# Patient Record
Sex: Male | Born: 1950 | Race: White | Hispanic: No | Smoking: Former smoker
Health system: Southern US, Community
[De-identification: ages and names within clinical notes are randomized; demographics above are authoritative.]

## PROBLEM LIST (undated history)

## (undated) DIAGNOSIS — M1990 Unspecified osteoarthritis, unspecified site: Secondary | ICD-10-CM

## (undated) DIAGNOSIS — C801 Malignant (primary) neoplasm, unspecified: Secondary | ICD-10-CM

## (undated) DIAGNOSIS — I2510 Atherosclerotic heart disease of native coronary artery without angina pectoris: Secondary | ICD-10-CM

## (undated) DIAGNOSIS — E7800 Pure hypercholesterolemia, unspecified: Secondary | ICD-10-CM

## (undated) DIAGNOSIS — C61 Malignant neoplasm of prostate: Secondary | ICD-10-CM

## (undated) DIAGNOSIS — E039 Hypothyroidism, unspecified: Secondary | ICD-10-CM

## (undated) HISTORY — PX: HERNIA REPAIR: SHX51

## (undated) HISTORY — DX: Hypothyroidism, unspecified: E03.9

## (undated) HISTORY — PX: COLONOSCOPY: SHX174

## (undated) HISTORY — PX: MOHS SURGERY: SUR867

## (undated) HISTORY — DX: Malignant (primary) neoplasm, unspecified: C80.1

## (undated) HISTORY — DX: Malignant neoplasm of prostate: C61

## (undated) HISTORY — PX: TONSILLECTOMY: SUR1361

## (undated) HISTORY — PX: ESOPHAGOGASTRODUODENOSCOPY ENDOSCOPY: SHX5814

---

## 2000-05-23 ENCOUNTER — Encounter: Admission: RE | Admit: 2000-05-23 | Discharge: 2000-05-23 | Payer: Self-pay | Admitting: Family Medicine

## 2000-05-23 ENCOUNTER — Encounter: Payer: Self-pay | Admitting: Family Medicine

## 2009-06-17 ENCOUNTER — Ambulatory Visit: Admission: RE | Admit: 2009-06-17 | Discharge: 2009-07-27 | Payer: Self-pay | Admitting: Radiation Oncology

## 2012-03-07 ENCOUNTER — Other Ambulatory Visit: Payer: Self-pay | Admitting: Gastroenterology

## 2012-03-07 DIAGNOSIS — R109 Unspecified abdominal pain: Secondary | ICD-10-CM

## 2012-03-09 ENCOUNTER — Ambulatory Visit
Admission: RE | Admit: 2012-03-09 | Discharge: 2012-03-09 | Disposition: A | Discharge status: Home / Self Care | Payer: BC Managed Care – PPO | Source: Ambulatory Visit | Attending: Gastroenterology | Admitting: Gastroenterology

## 2012-03-09 DIAGNOSIS — R109 Unspecified abdominal pain: Secondary | ICD-10-CM

## 2014-09-16 ENCOUNTER — Telehealth: Payer: Self-pay | Admitting: *Deleted

## 2014-09-16 ENCOUNTER — Encounter: Payer: Self-pay | Admitting: *Deleted

## 2014-09-16 NOTE — Telephone Encounter (Signed)
ERROR

## 2014-09-16 NOTE — Telephone Encounter (Signed)
I CALLED PT TO GET FAMILY HX/STATUS INFORMATION. THE PT WAS IN A MEETING AND STATED HE WOULD CALL BACK LATER WITH THE INFORMATION.

## 2014-09-24 ENCOUNTER — Encounter: Payer: Self-pay | Admitting: Cardiovascular Disease

## 2014-09-24 ENCOUNTER — Ambulatory Visit (INDEPENDENT_AMBULATORY_CARE_PROVIDER_SITE_OTHER): Payer: BLUE CROSS/BLUE SHIELD | Admitting: Cardiovascular Disease

## 2014-09-24 VITALS — BP 106/80 | HR 74 | Ht 73.0 in | Wt 183.8 lb

## 2014-09-24 DIAGNOSIS — E785 Hyperlipidemia, unspecified: Secondary | ICD-10-CM

## 2014-09-24 NOTE — Patient Instructions (Addendum)
Medication Instructions:  Your physician recommends that you continue on your current medications as directed. Please refer to the Current Medication list given to you today.   Labwork: Your physician recommends that you return for lab work (Lipomed profile) in: 6 months at least 3 weeks before your appointment with Dr. Acie Fredrickson   Testing/Procedures: Your physician has requested that you have coronary calcium score.  Computed tomography (CT) is a painless test that uses an x-ray machine to take clear, detailed pictures of your blood vessels.    Follow-Up: Your physician wants you to follow-up in: 7 months with Dr. Acie Fredrickson.  You will receive a reminder letter in the mail two months in advance. If you don't receive a letter, please call our office to schedule the follow-up appointment.

## 2014-09-24 NOTE — Progress Notes (Signed)
Cardiology Office Note   Date:  09/24/2014   ID:  Charles Solis, DOB 1950-08-25, MRN [id number]  PCP:  Charles Sails, MD  Cardiologist:   Charles Headings, MD   Chief Complaint  Patient presents with  . Hyperlipidemia   Problems: 1. Hyperlipidemia 2. hypothyroidism   History of Present Illness: Charles Solis is a 64 y.o. male who presents for hyperlipidemia.  Charles Solis has seen Charles Solis over the past years.  He has had his TSH checked by Dr. Sharol Solis and it is normal .   Was tried on Atorvastatin and had some mental sluggishness fairly shortly after that.  His recent lab work shows total cholesterol 253, triglyceride levels 133, LDL cholesterols 179.  His LDL particle number is 2349   Is otherwise healthy. Does have a family hx of CAD Father had MI and CABG at age 74     Has never had any chest pain . Eats fairly well.    Works out regularly .     Past Medical History  Diagnosis Date  . Prostate cancer   . Hypothyroidism     Past Surgical History  Procedure Laterality Date  . Tonsillectomy       Current Outpatient Prescriptions  Medication Sig Dispense Refill  . NATURE-THROID 65 MG tablet Take 65 mg by mouth daily.  2   No current facility-administered medications for this visit.    Allergies:   Review of patient's allergies indicates no known allergies.    Social History:  The patient  reports that he has quit smoking. He does not have any smokeless tobacco history on file. He reports that he drinks alcohol. He reports that he does not use illicit drugs.   Family History:  The patient's family history includes Alzheimer's disease in his mother and paternal grandmother; Colon cancer in his father; Coronary artery disease in his maternal grandfather; Heart attack in his father and maternal grandfather; Heart disease in his paternal grandfather; Hyperlipidemia in his sister; Hypertension in his mother; Lung cancer in his paternal grandfather.     ROS:  Please see the history of present illness.    Review of Systems: Constitutional:  denies fever, chills, diaphoresis, appetite change and fatigue.  HEENT: denies photophobia, eye pain, redness, hearing loss, ear pain, congestion, sore throat, rhinorrhea, sneezing, neck pain, neck stiffness and tinnitus.  Respiratory: denies SOB, DOE, cough, chest tightness, and wheezing.  Cardiovascular: denies chest pain, palpitations and leg swelling.  Gastrointestinal: denies nausea, vomiting, abdominal pain, diarrhea, constipation, blood in stool.  Genitourinary: denies dysuria, urgency, frequency, hematuria, flank pain and difficulty urinating.  Musculoskeletal: denies  myalgias, back pain, joint swelling, arthralgias and gait problem.   Skin: denies pallor, rash and wound.  Neurological: denies dizziness, seizures, syncope, weakness, light-headedness, numbness and headaches.   Hematological: denies adenopathy, easy bruising, personal or family bleeding history.  Psychiatric/ Behavioral: denies suicidal ideation, mood changes, confusion, nervousness, sleep disturbance and agitation.       All other systems are reviewed and negative.    PHYSICAL EXAM: VS:  BP 106/80 mmHg  Pulse 74  Ht 6\' 1"  (1.854 m)  Wt 83.371 kg (183 lb 12.8 oz)  BMI 24.25 kg/m2 , BMI Body mass index is 24.25 kg/(m^2). GEN: Well nourished, well developed, in no acute distress HEENT: normal Neck: no JVD, carotid bruits, or masses Cardiac: RRR; no murmurs, rubs, or gallops,no edema  Respiratory:  clear to auscultation bilaterally, normal work of breathing GI: soft, nontender, nondistended, + BS MS:  no deformity or atrophy Skin: warm and dry, no rash Neuro:  Strength and sensation are intact Psych: normal   EKG:  EKG is ordered today. The ekg ordered today demonstrates NSR at 74.  Normal ECG   Recent Labs: No results found for requested labs within last 365 days.    Lipid Panel No results found for:  CHOL, TRIG, HDL, CHOLHDL, VLDL, LDLCALC, LDLDIRECT    Wt Readings from Last 3 Encounters:  09/24/14 83.371 kg (183 lb 12.8 oz)      Other studies Reviewed: Additional studies/ records that were reviewed today include: . Review of the above records demonstrates:    ASSESSMENT AND PLAN:  1.  Hyperlipidemia: The patient resides with a history of hyperlipidemia. He does not have any episodes of chest pain. He does not have a strong family history of coronary artery disease. His father did have a heart attack at age 29. He's been tried on statins before but developed some mental sluggishness after starting Lipitor.  We will schedule him for a coronary calcium score. If his cardiac calcium score is very low then I do not think that he necessarily needs to be on any specific treatment for his high cholesterol. He has any significant amount of coronary calcium and we need to strongly consider starting him on medication. Possible strategies include low-dose Crestor and/or Zetia. We'll see him back in 6 months for a lipomed  Profile.  I'll see him in the office several weeks after that blood work.   Current medicines are reviewed at length with the patient today.  The patient does not have concerns regarding medicines.  The following changes have been made:  no change  Labs/ tests ordered today include:  No orders of the defined types were placed in this encounter.     Disposition:   FU with me in 6 months     Charles Solis, Charles Cheng, MD  09/24/2014 4:50 PM    [facility] [place], [place], [place]  [address] Phone: [phone]; Fax: [phone]   [facility]  [address] [place], [place]  [address] [phone]   Fax [phone]

## 2014-09-29 ENCOUNTER — Ambulatory Visit (INDEPENDENT_AMBULATORY_CARE_PROVIDER_SITE_OTHER)
Admission: RE | Admit: 2014-09-29 | Discharge: 2014-09-29 | Disposition: A | Discharge status: Home / Self Care | Payer: BLUE CROSS/BLUE SHIELD | Source: Ambulatory Visit | Attending: Cardiovascular Disease | Admitting: Cardiovascular Disease

## 2014-09-29 DIAGNOSIS — E785 Hyperlipidemia, unspecified: Secondary | ICD-10-CM

## 2014-10-02 ENCOUNTER — Telehealth: Payer: Self-pay | Admitting: Nurse Practitioner

## 2014-10-02 MED ORDER — ROSUVASTATIN CALCIUM 10 MG PO TABS: 10.0000 mg | ORAL_TABLET | Freq: Every day | ORAL | Status: DC

## 2014-10-02 NOTE — Telephone Encounter (Signed)
-----   Message from Thayer Headings, MD sent at 09/29/2014  5:57 PM EDT ----- His LDL is 179.  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  Alternatively, if he has good insurance coverage, I think Crestor 10 mg a day may work better. See if he has good coverage for prescriptions  We can start Atorvastatin 40 and check lipids and lipomed profile, bmp, liver enz  in 3 months and then change to crestor if needed

## 2014-10-02 NOTE — Telephone Encounter (Signed)
Reviewed results with patient who verbalized understanding and agreement to start Crestor 10 mg once daily; wants Rx sent to [facility].  He states he would prefer to pay more for the better medication and reports "brain fog" when he was previously on Atorvastatin.  He is scheduled for follow-up with repeat labs including lipomed profile in 6 months.  I advised him to call back if he has questions or concerns.

## 2014-10-07 ENCOUNTER — Encounter: Payer: Self-pay | Admitting: Cardiovascular Disease

## 2014-10-21 ENCOUNTER — Ambulatory Visit: Payer: Self-pay | Admitting: Cardiovascular Disease

## 2014-12-24 ENCOUNTER — Encounter: Payer: Self-pay | Admitting: Cardiovascular Disease

## 2014-12-25 ENCOUNTER — Encounter: Payer: Self-pay | Admitting: Cardiovascular Disease

## 2015-03-23 ENCOUNTER — Other Ambulatory Visit (INDEPENDENT_AMBULATORY_CARE_PROVIDER_SITE_OTHER): Payer: BLUE CROSS/BLUE SHIELD | Admitting: *Deleted

## 2015-03-23 DIAGNOSIS — E785 Hyperlipidemia, unspecified: Secondary | ICD-10-CM | POA: Diagnosis not present

## 2015-03-23 NOTE — Addendum Note (Signed)
Addended by: Eulis Foster on: 03/23/2015 09:37 AM   Modules accepted: Orders

## 2015-03-26 LAB — CARDIO IQ(R) ADVANCED LIPID PANEL
APOLIPOPROTEIN (CARDIO IQ ADV LIPID PANEL): 91 mg/dL (ref 52–109)
Cholesterol, Total: 179 mg/dL (ref 125–200)
Cholesterol/HDL Ratio: 3.8 calc (ref <=5.0)
HDL Cholesterol: 47 mg/dL (ref >=40)
LDL LARGE: 7032 nmol/L (ref 4334–10815)
LDL MEDIUM: 243 nmol/L (ref 167–465)
LDL Particle Number: 1261 nmol/L (ref 1016–2185)
LDL Peak Size: 214 Angstrom — ABNORMAL LOW (ref >=218.2)
LDL SMALL: 251 nmol/L (ref 123–441)
LDL, Calculated: 86 mg/dL
LIPOPROTEIN (A) (CARDIO IQ ADV LIPID PANEL): 74 nmol/L (ref <75)
NON-HDL CHOLESTEROL (CARDIO IQ ADV LIPID PANEL): 132 mg/dL
Triglycerides: 230 mg/dL — ABNORMAL HIGH

## 2015-04-20 ENCOUNTER — Ambulatory Visit (INDEPENDENT_AMBULATORY_CARE_PROVIDER_SITE_OTHER): Payer: BLUE CROSS/BLUE SHIELD | Admitting: Cardiovascular Disease

## 2015-04-20 ENCOUNTER — Encounter: Payer: Self-pay | Admitting: Cardiovascular Disease

## 2015-04-20 VITALS — BP 100/80 | HR 70 | Ht 73.0 in | Wt 184.0 lb

## 2015-04-20 DIAGNOSIS — E785 Hyperlipidemia, unspecified: Secondary | ICD-10-CM

## 2015-04-20 MED ORDER — ROSUVASTATIN CALCIUM 10 MG PO TABS: ORAL_TABLET | ORAL | Status: DC

## 2015-04-20 NOTE — Progress Notes (Signed)
Cardiology Office Note   Date:  04/20/2015   ID:  Charles Solis, DOB [birth year], MRN [id number]  PCP:  Lollie Sails, MD  Cardiologist:   Thayer Headings, MD   Chief Complaint  Patient presents with  . Hyperlipidemia   Problems: 1. Hyperlipidemia 2. hypothyroidism   History of Present Illness: Charles Solis is a 65 y.o. male who presents for hyperlipidemia.  Charles Solis has seen Dr. Adriana Simas over the past years.  He has had his TSH checked by Dr. Sharol Roussel and it is normal .   Was tried on Atorvastatin and had some mental sluggishness fairly shortly after that.  His recent lab work shows total cholesterol 253, triglyceride levels 133, LDL cholesterols 179.  His LDL particle number is 2349   Is otherwise healthy. Does have a family hx of CAD Father had MI and CABG at age 17     Has never had any chest pain . Eats fairly well.    Works out regularly .    04/20/2015: Charles Solis is seen today for follow-up of his hypercholesterolemia. Coronary calcium score:  IMPRESSION: Coronary calcium score of 50. This was 72 percentile for age and sex matched control.  Was started on Crestor - tolerating fairly well,   Did not tolerate Atorvastatin in the past. Had some brain foggyness.  He's cut his dose to Crestor 10 mg 3 times a week approximate 4 months ago. His LDL particle number has decreased slightly. In August his LDL particle number was 1890 After the crestor 3 times a week, it came down to 1261.    Past Medical History  Diagnosis Date  . Prostate cancer ([place])   . Hypothyroidism     Past Surgical History  Procedure Laterality Date  . Tonsillectomy       Current Outpatient Prescriptions  Medication Sig Dispense Refill  . NATURE-THROID 65 MG tablet Take 65 mg by mouth daily.  2  . rosuvastatin (CRESTOR) 10 MG tablet Take 1 tablet (10 mg total) by mouth daily. 31 tablet 11   No current facility-administered medications for this visit.    Allergies:   Review of  patient's allergies indicates no known allergies.    Social History:  The patient  reports that he has quit smoking. He does not have any smokeless tobacco history on file. He reports that he drinks alcohol. He reports that he does not use illicit drugs.   Family History:  The patient's family history includes Alzheimer's disease in his mother and paternal grandmother; Colon cancer in his father; Coronary artery disease in his maternal grandfather; Heart attack in his father and maternal grandfather; Heart disease in his paternal grandfather; Hyperlipidemia in his sister; Hypertension in his mother; Lung cancer in his paternal grandfather.    ROS:  Please see the history of present illness.    Review of Systems: Constitutional:  denies fever, chills, diaphoresis, appetite change and fatigue.  HEENT: denies photophobia, eye pain, redness, hearing loss, ear pain, congestion, sore throat, rhinorrhea, sneezing, neck pain, neck stiffness and tinnitus.  Respiratory: denies SOB, DOE, cough, chest tightness, and wheezing.  Cardiovascular: denies chest pain, palpitations and leg swelling.  Gastrointestinal: denies nausea, vomiting, abdominal pain, diarrhea, constipation, blood in stool.  Genitourinary: denies dysuria, urgency, frequency, hematuria, flank pain and difficulty urinating.  Musculoskeletal: denies  myalgias, back pain, joint swelling, arthralgias and gait problem.   Skin: denies pallor, rash and wound.  Neurological: denies dizziness, seizures, syncope, weakness, light-headedness, numbness and headaches.   Hematological: denies  adenopathy, easy bruising, personal or family bleeding history.  Psychiatric/ Behavioral: denies suicidal ideation, mood changes, confusion, nervousness, sleep disturbance and agitation.       All other systems are reviewed and negative.    PHYSICAL EXAM: VS:  BP 100/80 mmHg  Pulse 70  Ht 6\' 1"  (1.854 m)  Wt 184 lb (83.462 kg)  BMI 24.28 kg/m2 , BMI Body  mass index is 24.28 kg/(m^2). GEN: Well nourished, well developed, in no acute distress HEENT: normal Neck: no JVD, carotid bruits, or masses Cardiac: RRR; no murmurs, rubs, or gallops,no edema  Respiratory:  clear to auscultation bilaterally, normal work of breathing GI: soft, nontender, nondistended, + BS MS: no deformity or atrophy Skin: warm and dry, no rash Neuro:  Strength and sensation are intact Psych: normal   EKG:  EKG is ordered today. The ekg ordered today demonstrates NSR at 74.  Normal ECG   Recent Labs: No results found for requested labs within last 365 days.    Lipid Panel    Component Value Date/Time   CHOL 179 03/23/2015 0937   TRIG 230* 03/23/2015 0937   HDL 47 03/23/2015 0937   CHOLHDL 3.8 03/23/2015 0937   LDLCALC 86 03/23/2015 0937      Wt Readings from Last 3 Encounters:  04/20/15 184 lb (83.462 kg)  09/24/14 183 lb 12.8 oz (83.371 kg)      Other studies Reviewed: Additional studies/ records that were reviewed today include: . Review of the above records demonstrates:    ASSESSMENT AND PLAN:  1.  Hyperlipidemia: The patient resides with a history of hyperlipidemia. He does not have any episodes of chest pain. He does not have a strong family history of coronary artery disease. His father did have a heart attack at age 60.  crestor is helping  Original LDL particle number was 1890 Crestor 10 mg 3 times a week , LDL particle number  = 1261  Will increase his crestor up to 10 mg 5 times a week. Will recheck Cardio IQ in 3 months with an office visit 3-4 weeks later.      Current medicines are reviewed at length with the patient today.  The patient does not have concerns regarding medicines.  The following changes have been made:  no change  Labs/ tests ordered today include:  No orders of the defined types were placed in this encounter.    Disposition:   FU with me in 3-4   months    Monick Rena, Wonda Cheng, MD  04/20/2015 11:38 AM      [place], [place], [place]  [address] Phone: [phone]; Fax: [phone]   [facility]  [address] [place], [place]  [address] [phone]   Fax [phone]

## 2015-04-20 NOTE — Patient Instructions (Signed)
Medication Instructions:  INCREASE Crestor 10 mg to 5 times per week   Labwork: Your physician recommends that you return for lab work in: 3 months for Cardio IQ/lipid panel   Testing/Procedures: None Ordered   Follow-Up: Your physician recommends that you schedule a follow-up appointment in: 4 months with Dr. Acie Fredrickson   If you need a refill on your cardiac medications before your next appointment, please call your pharmacy.   Thank you for choosing [facility]! Christen Bame, RN [phone]

## 2015-05-18 ENCOUNTER — Other Ambulatory Visit: Payer: Self-pay | Admitting: *Deleted

## 2015-05-18 MED ORDER — ROSUVASTATIN CALCIUM 10 MG PO TABS: ORAL_TABLET | ORAL | Status: DC

## 2015-07-21 ENCOUNTER — Other Ambulatory Visit (INDEPENDENT_AMBULATORY_CARE_PROVIDER_SITE_OTHER): Payer: BLUE CROSS/BLUE SHIELD

## 2015-07-21 DIAGNOSIS — E785 Hyperlipidemia, unspecified: Secondary | ICD-10-CM

## 2015-07-24 LAB — CARDIO IQ(R) ADVANCED LIPID PANEL
Apolipoprotein B: 88 mg/dL (ref 52–109)
CHOLESTEROL/HDL RATIO (CARDIO IQ ADV LIPID PANEL): 3 calc (ref <=5.0)
Cholesterol, Total: 170 mg/dL (ref 125–200)
HDL CHOLESTEROL (CARDIO IQ ADV LIPID PANEL): 56 mg/dL (ref >=40)
LDL CHOLESTEROL CALCULATED (CARDIO IQ ADV LIPID PANEL): 95 mg/dL
LDL Large: 5954 nmol/L (ref 4334–10815)
LDL Medium: 304 nmol/L (ref 167–465)
LDL PEAK SIZE: 221.1 Angstrom (ref >=218.2)
LDL Particle Number: 1402 nmol/L (ref 1016–2185)
LDL Small: 202 nmol/L (ref 123–441)
LIPOPROTEIN (A) (CARDIO IQ ADV LIPID PANEL): 62 nmol/L (ref <75)
NON-HDL CHOLESTEROL (CARDIO IQ ADV LIPID PANEL): 114 mg/dL
TRIGLYCERIDES (CARDIO IQ ADV LIPID PANEL): 97 mg/dL

## 2015-08-24 DIAGNOSIS — C61 Malignant neoplasm of prostate: Secondary | ICD-10-CM | POA: Diagnosis not present

## 2015-08-24 DIAGNOSIS — N5201 Erectile dysfunction due to arterial insufficiency: Secondary | ICD-10-CM | POA: Diagnosis not present

## 2015-08-24 DIAGNOSIS — K4090 Unilateral inguinal hernia, without obstruction or gangrene, not specified as recurrent: Secondary | ICD-10-CM | POA: Diagnosis not present

## 2015-09-29 DIAGNOSIS — L821 Other seborrheic keratosis: Secondary | ICD-10-CM | POA: Diagnosis not present

## 2015-09-29 DIAGNOSIS — D225 Melanocytic nevi of trunk: Secondary | ICD-10-CM | POA: Diagnosis not present

## 2015-09-29 DIAGNOSIS — L570 Actinic keratosis: Secondary | ICD-10-CM | POA: Diagnosis not present

## 2015-09-29 DIAGNOSIS — L812 Freckles: Secondary | ICD-10-CM | POA: Diagnosis not present

## 2015-09-29 DIAGNOSIS — L578 Other skin changes due to chronic exposure to nonionizing radiation: Secondary | ICD-10-CM | POA: Diagnosis not present

## 2015-09-29 DIAGNOSIS — D1801 Hemangioma of skin and subcutaneous tissue: Secondary | ICD-10-CM | POA: Diagnosis not present

## 2015-09-29 DIAGNOSIS — Z85828 Personal history of other malignant neoplasm of skin: Secondary | ICD-10-CM | POA: Diagnosis not present

## 2015-10-12 ENCOUNTER — Ambulatory Visit: Payer: BLUE CROSS/BLUE SHIELD | Admitting: Cardiovascular Disease

## 2015-11-05 ENCOUNTER — Encounter: Payer: Self-pay | Admitting: Cardiovascular Disease

## 2015-11-23 ENCOUNTER — Encounter: Payer: Self-pay | Admitting: Cardiovascular Disease

## 2015-11-23 ENCOUNTER — Encounter (INDEPENDENT_AMBULATORY_CARE_PROVIDER_SITE_OTHER): Payer: Self-pay

## 2015-11-23 ENCOUNTER — Ambulatory Visit (INDEPENDENT_AMBULATORY_CARE_PROVIDER_SITE_OTHER): Payer: Medicare Other | Admitting: Cardiovascular Disease

## 2015-11-23 VITALS — BP 130/90 | HR 68 | Ht 73.0 in | Wt 183.4 lb

## 2015-11-23 DIAGNOSIS — E785 Hyperlipidemia, unspecified: Secondary | ICD-10-CM

## 2015-11-23 MED ORDER — EZETIMIBE 10 MG PO TABS: 10.0000 mg | ORAL_TABLET | Freq: Every day | ORAL | 11 refills | Status: DC

## 2015-11-23 NOTE — Progress Notes (Signed)
Cardiology Office Note   Date:  11/23/2015   ID:  Tollie Gargus, DOB October 29, 1950, MRN [id number]  PCP:  Lollie Sails, MD  Cardiologist:   Mertie Moores, MD   Chief Complaint  Patient presents with  . Hyperlipidemia   Problems: 1. Hyperlipidemia 2. hypothyroidism    Charles Solis is a 65 y.o. male who presents for hyperlipidemia.  Charles Solis has seen Dr. Adriana Simas over the past years.  He has had his TSH checked by Dr. Sharol Roussel and it is normal .   Was tried on Atorvastatin and had some mental sluggishness fairly shortly after that.  His recent lab work shows total cholesterol 253, triglyceride levels 133, LDL cholesterols 179.  His LDL particle number is 2349   Is otherwise healthy. Does have a family hx of CAD Father had MI and CABG at age 48     Has never had any chest pain . Eats fairly well.    Works out regularly .    04/20/2015: Charles Solis is seen today for follow-up of his hypercholesterolemia. Coronary calcium score:  IMPRESSION: Coronary calcium score of 50. This was 57 percentile for age and sex matched control.  Was started on Crestor - tolerating fairly well,   Did not tolerate Atorvastatin in the past. Had some brain foggyness.  He's cut his dose to Crestor 10 mg 3 times a week approximate 4 months ago. His LDL particle number has decreased slightly. In August his LDL particle number was 1890 After the crestor 3 times a week, it came down to 1261.   Oct. 9 , 2017   Charles Solis is see today for follow Up of his hyperlipidemia. Cardio IQ on June 6 revealed an LDL particle number of 1042.   He has a LDL pattern A which is desirable .  Takes Crestor 10 mg M,W, F, Sat.  No CP or dyspnea.  Staying very active.  Watches his diet .   Past Medical History:  Diagnosis Date  . Hypothyroidism   . Prostate cancer [place])     Past Surgical History:  Procedure Laterality Date  . TONSILLECTOMY       Current Outpatient Prescriptions  Medication Sig Dispense Refill    . busPIRone (BUSPAR) 5 MG tablet Take 5 mg by mouth daily.  2  . NATURE-THROID 65 MG tablet Take 65 mg by mouth daily.  2  . PARoxetine (PAXIL) 30 MG tablet Take 30 mg by mouth at bedtime.  8  . rosuvastatin (CRESTOR) 10 MG tablet Take 5 days per week 90 tablet 2   No current facility-administered medications for this visit.     Allergies:   Review of patient's allergies indicates no known allergies.    Social History:  The patient  reports that he has quit smoking. He has never used smokeless tobacco. He reports that he drinks alcohol. He reports that he does not use drugs.   Family History:  The patient's family history includes Alzheimer's disease in his mother and paternal grandmother; Colon cancer in his father; Coronary artery disease in his maternal grandfather; Heart attack in his father and maternal grandfather; Heart disease in his paternal grandfather; Hyperlipidemia in his sister; Hypertension in his mother; Lung cancer in his paternal grandfather.    ROS:  Please see the history of present illness.    Review of Systems: Constitutional:  denies fever, chills, diaphoresis, appetite change and fatigue.  HEENT: denies photophobia, eye pain, redness, hearing loss, ear pain, congestion, sore throat, rhinorrhea, sneezing, neck pain,  neck stiffness and tinnitus.  Respiratory: denies SOB, DOE, cough, chest tightness, and wheezing.  Cardiovascular: denies chest pain, palpitations and leg swelling.  Gastrointestinal: denies nausea, vomiting, abdominal pain, diarrhea, constipation, blood in stool.  Genitourinary: denies dysuria, urgency, frequency, hematuria, flank pain and difficulty urinating.  Musculoskeletal: denies  myalgias, back pain, joint swelling, arthralgias and gait problem.   Skin: denies pallor, rash and wound.  Neurological: denies dizziness, seizures, syncope, weakness, light-headedness, numbness and headaches.   Hematological: denies adenopathy, easy bruising,  personal or family bleeding history.  Psychiatric/ Behavioral: denies suicidal ideation, mood changes, confusion, nervousness, sleep disturbance and agitation.       All other systems are reviewed and negative.    PHYSICAL EXAM: VS:  BP 130/90 (BP Location: Right Arm, Patient Position: Sitting, Cuff Size: Normal)   Pulse 68   Ht 6\' 1"  (1.854 m)   Wt 183 lb 6.4 oz (83.2 kg)   BMI 24.20 kg/m  , BMI Body mass index is 24.2 kg/m. GEN: Well nourished, well developed, in no acute distress  HEENT: normal  Neck: no JVD, carotid bruits, or masses Cardiac: RRR; no murmurs, rubs, or gallops,no edema  Respiratory:  clear to auscultation bilaterally, normal work of breathing GI: soft, nontender, nondistended, + BS MS: no deformity or atrophy  Skin: warm and dry, no rash Neuro:  Strength and sensation are intact Psych: normal   EKG:  EKG is ordered today. The ekg ordered today demonstrates NSR at 68.  Normal ECG   Recent Labs: No results found for requested labs within last 8760 hours.    Lipid Panel    Component Value Date/Time   CHOL 170 07/21/2015 0916   TRIG 97 07/21/2015 0916   HDL 56 07/21/2015 0916   CHOLHDL 3.0 07/21/2015 0916   LDLCALC 95 07/21/2015 0916      Wt Readings from Last 3 Encounters:  11/23/15 183 lb 6.4 oz (83.2 kg)  04/20/15 184 lb (83.5 kg)  09/24/14 183 lb 12.8 oz (83.4 kg)      Other studies Reviewed: Additional studies/ records that were reviewed today include: . Review of the above records demonstrates:    ASSESSMENT AND PLAN:  1.  Hyperlipidemia: The patient resides with a history of hyperlipidemia. He does not have any episodes of chest pain. He does not have a strong family history of coronary artery disease. His father did have a heart attack at age 68.  crestor is helping  Original LDL particle number was 1890 Crestor 10 mg  4 times a week , LDL particle number  =  1402   Will add Zetia to try to lower his LDL particle number to  1000.  Will recheck Cardio IQ in 3 months with an office visit in 6 months       Current medicines are reviewed at length with the patient today.  The patient does not have concerns regarding medicines.  The following changes have been made:  no change  Labs/ tests ordered today include:  No orders of the defined types were placed in this encounter.   Disposition:   FU with me in 6 months    Mertie Moores, MD  11/23/2015 9:55 AM    [place], [place], [place]  [address] Phone: [phone]; Fax: [phone]   [facility]  [address] [place], [place]  [address] [phone]   Fax [phone]

## 2015-11-23 NOTE — Patient Instructions (Signed)
Medication Instructions:  START Zetia 10 mg once daily   Labwork: Your physician recommends that you return for lab work in: 3 months  You will need to FAST for this appointment - nothing to eat or drink after midnight the night before except water.    Testing/Procedures: None Ordered   Follow-Up: Your physician wants you to follow-up in: 6 months with Dr. Acie Fredrickson.  You will receive a reminder letter in the mail two months in advance. If you don't receive a letter, please call our office to schedule the follow-up appointment.   If you need a refill on your cardiac medications before your next appointment, please call your pharmacy.   Thank you for choosing [facility]! Christen Bame, RN [phone]

## 2015-11-25 DIAGNOSIS — Z23 Encounter for immunization: Secondary | ICD-10-CM | POA: Diagnosis not present

## 2015-12-21 DIAGNOSIS — E039 Hypothyroidism, unspecified: Secondary | ICD-10-CM | POA: Diagnosis not present

## 2015-12-21 DIAGNOSIS — E291 Testicular hypofunction: Secondary | ICD-10-CM | POA: Diagnosis not present

## 2015-12-21 DIAGNOSIS — E559 Vitamin D deficiency, unspecified: Secondary | ICD-10-CM | POA: Diagnosis not present

## 2015-12-21 DIAGNOSIS — N400 Enlarged prostate without lower urinary tract symptoms: Secondary | ICD-10-CM | POA: Diagnosis not present

## 2015-12-21 DIAGNOSIS — C61 Malignant neoplasm of prostate: Secondary | ICD-10-CM | POA: Diagnosis not present

## 2015-12-21 DIAGNOSIS — N401 Enlarged prostate with lower urinary tract symptoms: Secondary | ICD-10-CM | POA: Diagnosis not present

## 2016-01-09 DIAGNOSIS — S61213A Laceration without foreign body of left middle finger without damage to nail, initial encounter: Secondary | ICD-10-CM | POA: Diagnosis not present

## 2016-01-13 DIAGNOSIS — J029 Acute pharyngitis, unspecified: Secondary | ICD-10-CM | POA: Diagnosis not present

## 2016-01-27 DIAGNOSIS — H903 Sensorineural hearing loss, bilateral: Secondary | ICD-10-CM | POA: Diagnosis not present

## 2016-01-27 DIAGNOSIS — H838X3 Other specified diseases of inner ear, bilateral: Secondary | ICD-10-CM | POA: Diagnosis not present

## 2016-02-23 ENCOUNTER — Other Ambulatory Visit: Payer: Medicare Other | Admitting: *Deleted

## 2016-02-23 ENCOUNTER — Encounter (INDEPENDENT_AMBULATORY_CARE_PROVIDER_SITE_OTHER): Payer: Self-pay

## 2016-02-23 DIAGNOSIS — E785 Hyperlipidemia, unspecified: Secondary | ICD-10-CM | POA: Diagnosis not present

## 2016-02-23 NOTE — Addendum Note (Signed)
Addended by: Eulis Foster on: 02/23/2016 10:45 AM   Modules accepted: Orders

## 2016-02-24 LAB — NMR, LIPOPROFILE
CHOLESTEROL: 168 mg/dL (ref 100–199)
HDL Cholesterol by NMR: 57 mg/dL (ref >39)
HDL PARTICLE NUMBER: 41.3 umol/L (ref >=30.5)
LDL PARTICLE NUMBER: 1229 nmol/L — AB (ref <1000)
LDL SIZE: 20.7 nm (ref >20.5)
LDL-C: 94 mg/dL (ref 0–99)
LP-IR Score: 52 — ABNORMAL HIGH (ref <=45)
SMALL LDL PARTICLE NUMBER: 507 nmol/L (ref <=527)
TRIGLYCERIDES BY NMR: 83 mg/dL (ref 0–149)

## 2016-02-24 LAB — COMPREHENSIVE METABOLIC PANEL
A/G RATIO: 2.7 — AB (ref 1.2–2.2)
ALT: 41 IU/L (ref 0–44)
AST: 34 IU/L (ref 0–40)
Albumin: 4.6 g/dL (ref 3.6–4.8)
Alkaline Phosphatase: 53 IU/L (ref 39–117)
BILIRUBIN TOTAL: 0.7 mg/dL (ref 0.0–1.2)
BUN/Creatinine Ratio: 28 — ABNORMAL HIGH (ref 10–24)
BUN: 25 mg/dL (ref 8–27)
CALCIUM: 9.7 mg/dL (ref 8.6–10.2)
CO2: 25 mmol/L (ref 18–29)
Chloride: 99 mmol/L (ref 96–106)
Creatinine, Ser: 0.88 mg/dL (ref 0.76–1.27)
GFR calc Af Amer: 104 mL/min/{1.73_m2} (ref >59)
GFR, EST NON AFRICAN AMERICAN: 90 mL/min/{1.73_m2} (ref >59)
GLOBULIN, TOTAL: 1.7 g/dL (ref 1.5–4.5)
Glucose: 95 mg/dL (ref 65–99)
POTASSIUM: 4.3 mmol/L (ref 3.5–5.2)
SODIUM: 141 mmol/L (ref 134–144)
Total Protein: 6.3 g/dL (ref 6.0–8.5)

## 2016-02-26 ENCOUNTER — Telehealth: Payer: Self-pay | Admitting: Nurse Practitioner

## 2016-02-26 MED ORDER — ROSUVASTATIN CALCIUM 20 MG PO TABS: 20.0000 mg | ORAL_TABLET | Freq: Every day | ORAL | 3 refills | Status: DC

## 2016-02-26 NOTE — Telephone Encounter (Signed)
-----   Message from Thayer Headings, MD sent at 02/24/2016  2:21 PM EST ----- LDL particle number is 1229 - goal is 1000 Will he tolerate a higher dose of crestor  Have him try Crestor 20 mg a day ,   Continue zetia 10 mg a day  Check labs in 3 months

## 2016-02-26 NOTE — Telephone Encounter (Signed)
Reviewed lab result and plan of care with patient.  He agrees to increase Crestor to 20 mg once daily.  He is currently taking 10 mg 4 times per week.  He will continue with zetia 10 mg 4 times per week.  He is due for follow-up in April with Dr. Acie Fredrickson and will come in fasting in preparation for repeat lab work. He thanked me for the call.

## 2016-03-29 DIAGNOSIS — K5730 Diverticulosis of large intestine without perforation or abscess without bleeding: Secondary | ICD-10-CM | POA: Diagnosis not present

## 2016-03-29 DIAGNOSIS — Z800 Family history of malignant neoplasm of digestive organs: Secondary | ICD-10-CM | POA: Diagnosis not present

## 2016-03-29 DIAGNOSIS — K644 Residual hemorrhoidal skin tags: Secondary | ICD-10-CM | POA: Diagnosis not present

## 2016-03-29 DIAGNOSIS — K648 Other hemorrhoids: Secondary | ICD-10-CM | POA: Diagnosis not present

## 2016-03-29 DIAGNOSIS — Z1211 Encounter for screening for malignant neoplasm of colon: Secondary | ICD-10-CM | POA: Diagnosis not present

## 2016-04-07 DIAGNOSIS — D485 Neoplasm of uncertain behavior of skin: Secondary | ICD-10-CM | POA: Diagnosis not present

## 2016-04-07 DIAGNOSIS — Z85828 Personal history of other malignant neoplasm of skin: Secondary | ICD-10-CM | POA: Diagnosis not present

## 2016-04-07 DIAGNOSIS — D225 Melanocytic nevi of trunk: Secondary | ICD-10-CM | POA: Diagnosis not present

## 2016-04-07 DIAGNOSIS — L812 Freckles: Secondary | ICD-10-CM | POA: Diagnosis not present

## 2016-04-07 DIAGNOSIS — C44319 Basal cell carcinoma of skin of other parts of face: Secondary | ICD-10-CM | POA: Diagnosis not present

## 2016-04-07 DIAGNOSIS — L570 Actinic keratosis: Secondary | ICD-10-CM | POA: Diagnosis not present

## 2016-04-07 DIAGNOSIS — L821 Other seborrheic keratosis: Secondary | ICD-10-CM | POA: Diagnosis not present

## 2016-04-13 DIAGNOSIS — C61 Malignant neoplasm of prostate: Secondary | ICD-10-CM | POA: Diagnosis not present

## 2016-04-20 DIAGNOSIS — N401 Enlarged prostate with lower urinary tract symptoms: Secondary | ICD-10-CM | POA: Diagnosis not present

## 2016-04-20 DIAGNOSIS — R3915 Urgency of urination: Secondary | ICD-10-CM | POA: Diagnosis not present

## 2016-04-20 DIAGNOSIS — C61 Malignant neoplasm of prostate: Secondary | ICD-10-CM | POA: Diagnosis not present

## 2016-04-20 DIAGNOSIS — N411 Chronic prostatitis: Secondary | ICD-10-CM | POA: Diagnosis not present

## 2016-04-25 DIAGNOSIS — Z85828 Personal history of other malignant neoplasm of skin: Secondary | ICD-10-CM | POA: Diagnosis not present

## 2016-04-25 DIAGNOSIS — C44319 Basal cell carcinoma of skin of other parts of face: Secondary | ICD-10-CM | POA: Diagnosis not present

## 2016-04-28 IMAGING — CT CT HEART SCORING
1 of 3 series · 10 of 20 positions shown, 13 images · non-contrast
Comparison: None

ADDENDUM:
OVER-READ INTERPRETATION  CT CHEST

The following report is an over-read performed by radiologist Dr.
does not include interpretation of cardiac or coronary anatomy or
pathology. The calcium score interpretation by the cardiologist is
attached.
CLINICAL DATA: Risk stratification
MEDICATIONS:
None.
EXAM:
Coronary Calcium Score
TECHNIQUE: The patient was scanned on a Siemens Sensation 16 slice scanner.
Axial non-contrast 3 mm slices were carried out through the heart.
The data set was analyzed on a dedicated work station and scored
using the Agatson method.

[Series 6: st thins for reformat · axial · 0.33mm/px · z∈[+1115,+1262]mm · 10 of 181 slices shown, 13 images]
[im 17/181  vessel]
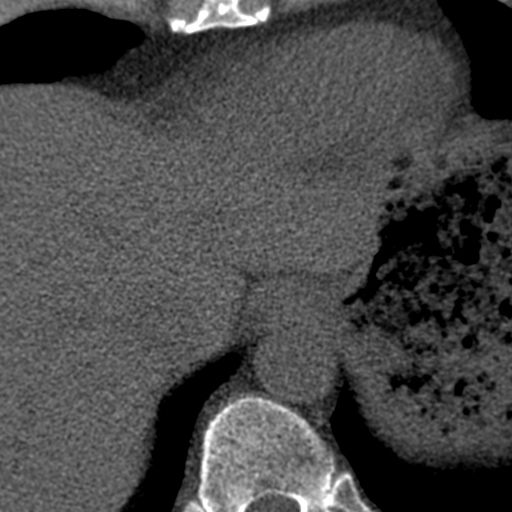
[im 17/181  lung]
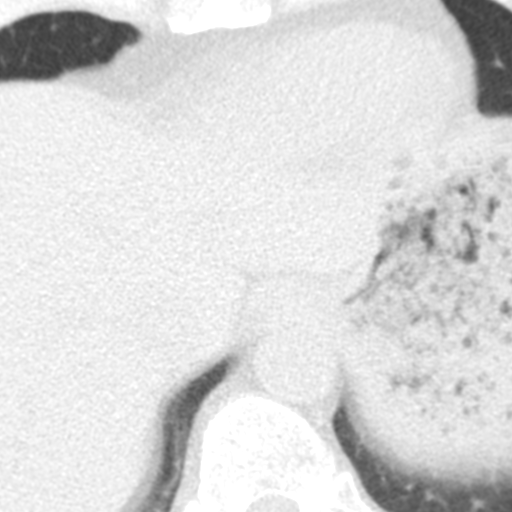
[im 33/181  vessel]
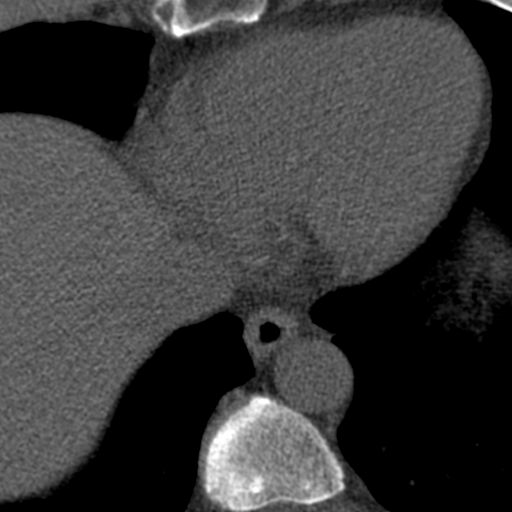
[im 50/181  vessel]
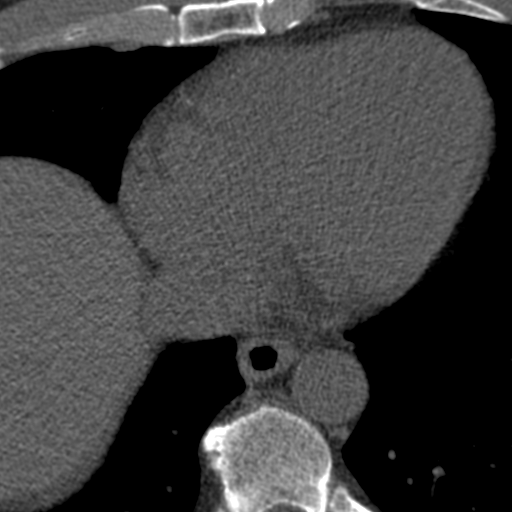
[im 66/181  vessel]
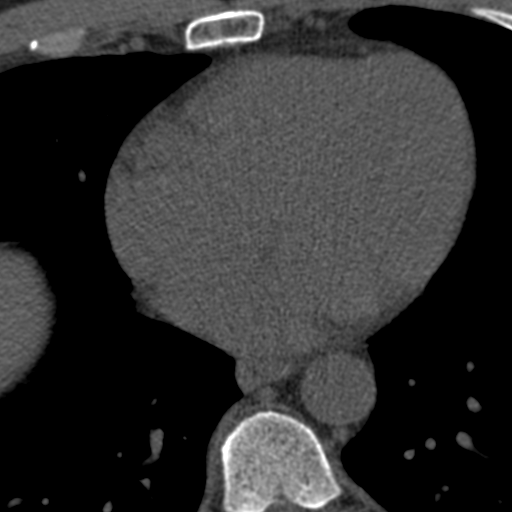
[im 82/181  vessel]
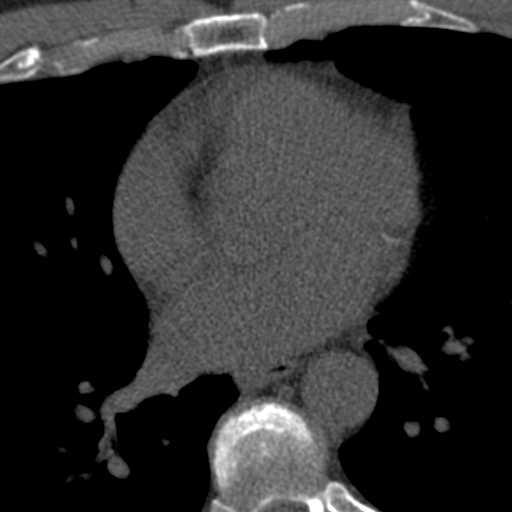
[im 82/181  lung]
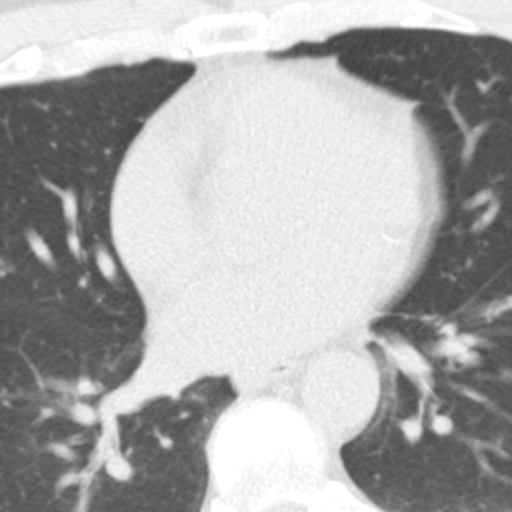
[im 99/181  vessel]
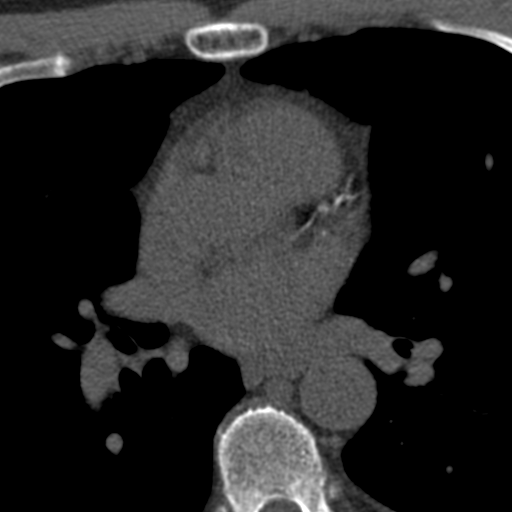
[im 115/181  vessel]
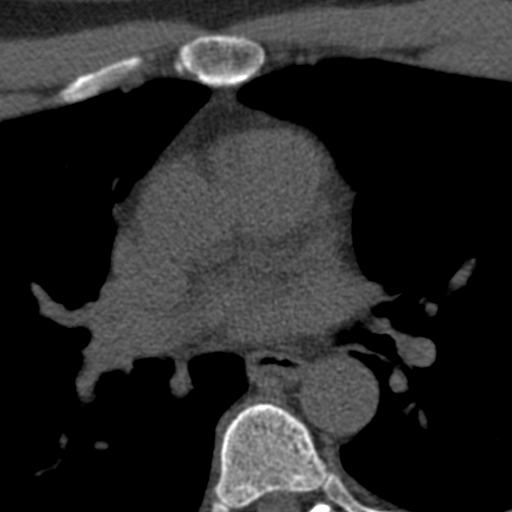
[im 131/181  vessel]
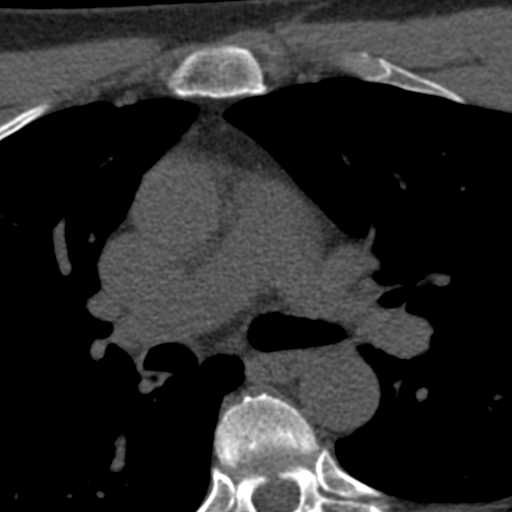
[im 148/181  vessel]
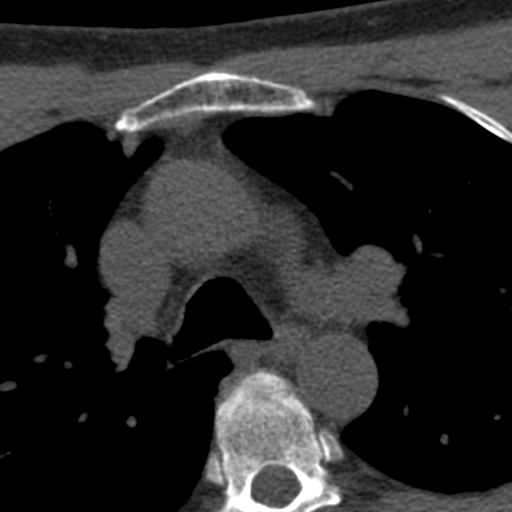
[im 148/181  lung]
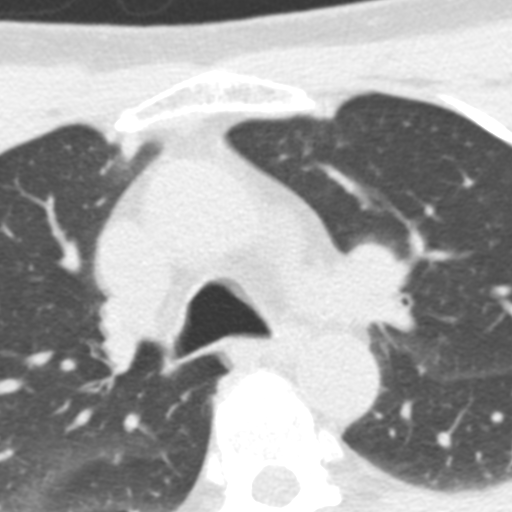
[im 164/181  vessel]
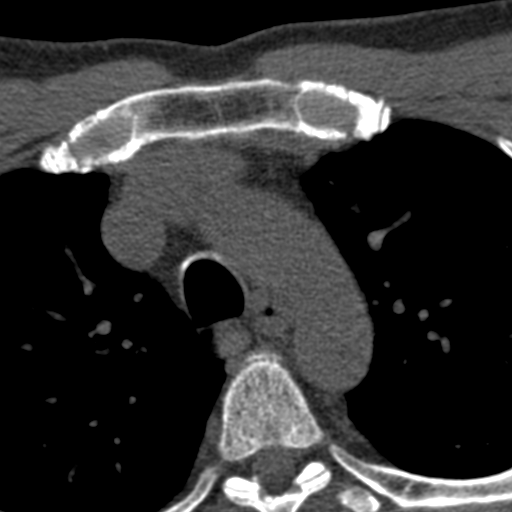

[10 of 20 positions shown; findings below may reference images not displayed]

FINDINGS: Mediastinum/Nodes: No mediastinal or definite hilar adenopathy,
given limitations of unenhanced CT.

Lungs/Pleura: No pleural fluid. Minimal motion degradation. Clear
lungs.

Upper abdomen: Normal imaged portions of the liver, spleen, stomach

Musculoskeletal: Thoracic spondylosis.
IMPRESSION: No significant extracardiac findings within the imaged chest.
FINDINGS: Non-cardiac: See separate report from [REDACTED].

Ascending Aorta:  Normal caliber.

Pericardium: Normal

Coronary arteries:  Normal origin.
IMPRESSION: Coronary calcium score of 50. This was 48 percentile for age and sex
matched control.

[address]

## 2016-05-02 DIAGNOSIS — C61 Malignant neoplasm of prostate: Secondary | ICD-10-CM | POA: Diagnosis not present

## 2016-05-02 DIAGNOSIS — Z0000 Encounter for general adult medical examination without abnormal findings: Secondary | ICD-10-CM | POA: Diagnosis not present

## 2016-05-02 DIAGNOSIS — Z8546 Personal history of malignant neoplasm of prostate: Secondary | ICD-10-CM | POA: Diagnosis not present

## 2016-05-02 DIAGNOSIS — C4490 Unspecified malignant neoplasm of skin, unspecified: Secondary | ICD-10-CM | POA: Diagnosis not present

## 2016-05-02 DIAGNOSIS — E038 Other specified hypothyroidism: Secondary | ICD-10-CM | POA: Diagnosis not present

## 2016-05-02 DIAGNOSIS — E7800 Pure hypercholesterolemia, unspecified: Secondary | ICD-10-CM | POA: Diagnosis not present

## 2016-05-23 ENCOUNTER — Ambulatory Visit (INDEPENDENT_AMBULATORY_CARE_PROVIDER_SITE_OTHER): Payer: Medicare Other | Admitting: Podiatry

## 2016-05-23 ENCOUNTER — Encounter: Payer: Self-pay | Admitting: Podiatry

## 2016-05-23 DIAGNOSIS — S90222A Contusion of left lesser toe(s) with damage to nail, initial encounter: Secondary | ICD-10-CM | POA: Diagnosis not present

## 2016-05-23 DIAGNOSIS — L601 Onycholysis: Secondary | ICD-10-CM

## 2016-05-23 NOTE — Patient Instructions (Signed)

## 2016-05-23 NOTE — Progress Notes (Signed)
   Subjective:    Patient ID: Charles Solis, male    DOB: Feb 12, 1951, 66 y.o.   MRN: [id number]  HPI  66 year old male presents the office they for concerns of his left big toenail becoming painful. He states that he was playing pickle ball about 2 weeks ago and stubbed his toe and since then he noticed blood underneath the toenail. More recently is started to hurt. He denies any redness or drainage coming from area. He said no recent treatment for a period is no other complaints today.   Review of Systems  All other systems reviewed and are negative.      Objective:   Physical Exam General: AAO x3, NAD  Dermatological: Left hallux toenail surgical lift from the underlying nail bed proximally. Underneath the entire toenail was several hematoma compressing 100% of the nail. There is localized edema to the proximal nail border but there is no surrounding erythema, ascending cellulitis. Is no drainage or pus or any other signs of infection. No open lesions.   Vascular: Dorsalis Pedis artery and Posterior Tibial artery pedal pulses are 2/4 bilateral with immedate capillary fill time. There is no pain with calf compression, swelling, warmth, erythema.   Neruologic: Grossly intact via light touch bilateral. Vibratory intact via tuning fork bilateral. Protective threshold with Semmes Wienstein monofilament intact to all pedal sites bilateral.   Musculoskeletal: No gross boney pedal deformities bilateral. No pain, crepitus, or limitation noted with foot and ankle range of motion bilateral. Muscular strength 5/5 in all groups tested bilateral.  Gait: Unassisted, Nonantalgic.      Assessment & Plan:  35-year-old male left subungual hematoma, onycholysis -Treatment options discussed including all alternatives, risks, and complications -Etiology of symptoms were discussed -At this time, recommended total nail removal without chemical matricectomy to left hallux nail. Risks and complications were  discussed with the patient for which they understand and  verbally consent to the procedure. Under sterile conditions a total of 3 mL of a mixture of 2% lidocaine plain and 0.5% Marcaine plain was infiltrated in a hallux block fashion. Once anesthetized, the skin was prepped in sterile fashion. A tourniquet was then applied. Next the left hallux  nail was excised making sure to remove the entire offending nail border. Once the nail was removed, the area was debrided and the underlying skin was intact. No laceration present.The area was irrigated and hemostasis was obtained.  A dry sterile dressing was applied. After application of the dressing the tourniquet was removed and there is found to be an immediate capillary refill time to the digit. The patient tolerated the procedure well any complications. Post procedure instructions were discussed the patient for which he verbally understood. Follow-up in one week for nail check or sooner if any problems are to arise. Discussed signs/symptoms of worsening infection and directed to call the office immediately should any occur or go directly to the emergency room. In the meantime, encouraged to call the office with any questions, concerns, changes symptoms.  Celesta Gentile, DPM

## 2016-06-02 ENCOUNTER — Ambulatory Visit (INDEPENDENT_AMBULATORY_CARE_PROVIDER_SITE_OTHER): Payer: Medicare Other | Admitting: Podiatry

## 2016-06-02 ENCOUNTER — Encounter: Payer: Self-pay | Admitting: Podiatry

## 2016-06-02 DIAGNOSIS — Z9889 Other specified postprocedural states: Secondary | ICD-10-CM

## 2016-06-02 NOTE — Patient Instructions (Signed)

## 2016-06-02 NOTE — Progress Notes (Signed)
Subjective: Charles Solis is a 66 y.o.  male returns to office today for follow up evaluation after having left Hallux total  nail avulsion performed. Patient has been soaking using epsom salts and applying topical antibiotic covered with bandaid daily until yesterday because he thought it was healing well. Denies any pain., drainage, swelling.  Patient denies fevers, chills, nausea, vomiting. Denies any calf pain, chest pain, SOB.   Objective:  Vitals: Reviewed  General: Well developed, nourished, in no acute distress, alert and oriented x3   Dermatology: Skin is warm, dry and supple bilateral. Left hallux nail bed appears to be clean, dry, with mild granular tissue and surrounding scab. There is no surrounding erythema, edema, drainage/purulence. The remaining nails appear unremarkable at this time. There are no other lesions or other signs of infection present.  Neurovascular status: Intact. No lower extremity swelling; No pain with calf compression bilateral.  Musculoskeletal: Decreased tenderness to palpation of the left hallux nail bed. Muscular strength within normal limits bilateral.   Assesement and Plan: S/p partial nail avulsion, doing well.   -Continue soaking in epsom salts twice a day followed by antibiotic ointment and a band-aid. Can leave uncovered at night. Continue this until completely healed.  -If the area has not healed in 2 weeks, call the office for follow-up appointment, or sooner if any problems arise.  -Monitor for any signs/symptoms of infection. Call the office immediately if any occur or go directly to the emergency room. Call with any questions/concerns.  Celesta Gentile, DPM

## 2016-06-05 DIAGNOSIS — R0789 Other chest pain: Secondary | ICD-10-CM | POA: Diagnosis not present

## 2016-06-05 DIAGNOSIS — J40 Bronchitis, not specified as acute or chronic: Secondary | ICD-10-CM | POA: Diagnosis not present

## 2016-06-08 ENCOUNTER — Encounter: Payer: Self-pay | Admitting: Cardiovascular Disease

## 2016-06-08 ENCOUNTER — Ambulatory Visit (INDEPENDENT_AMBULATORY_CARE_PROVIDER_SITE_OTHER): Payer: Medicare Other | Admitting: Cardiovascular Disease

## 2016-06-08 VITALS — BP 120/86 | HR 86 | Ht 73.0 in | Wt 189.8 lb

## 2016-06-08 DIAGNOSIS — E782 Mixed hyperlipidemia: Secondary | ICD-10-CM | POA: Diagnosis not present

## 2016-06-08 NOTE — Progress Notes (Signed)
Cardiology Office Note   Date:  06/08/2016   ID:  Charles Solis, DOB [birth year], MRN [id number]  PCP:  Lollie Sails, MD  Cardiologist:   Mertie Moores, MD   Chief Complaint  Patient presents with  . Hyperlipidemia   Problems: 1. Hyperlipidemia 2. hypothyroidism    Charles Solis is a 66 y.o. male who presents for hyperlipidemia.  Charles Solis has seen Dr. Adriana Simas over the past years.  He has had his TSH checked by Dr. Sharol Roussel and it is normal .   Was tried on Atorvastatin and had some mental sluggishness fairly shortly after that.  His recent lab work shows total cholesterol 253, triglyceride levels 133, LDL cholesterols 179.  His LDL particle number is 2349   Is otherwise healthy. Does have a family hx of CAD Father had MI and CABG at age 53     Has never had any chest pain . Eats fairly well.    Works out regularly .    04/20/2015: Charles Solis is seen today for follow-up of his hypercholesterolemia. Coronary calcium score:  IMPRESSION: Coronary calcium score of 50. This was 26 percentile for age and sex matched control.  Was started on Crestor - tolerating fairly well,   Did not tolerate Atorvastatin in the past. Had some brain foggyness.  He's cut his dose to Crestor 10 mg 3 times a week approximate 4 months ago. His LDL particle number has decreased slightly. In August his LDL particle number was 1890 After the crestor 3 times a week, it came down to 1261.   Oct. 9 , 2017   Charles Solis is see today for follow Up of his hyperlipidemia. Cardio IQ on June 6 revealed an LDL particle number of 1042.   He has a LDL pattern A which is desirable .  Takes Crestor 10 mg M,W, F, Sat.  No CP or dyspnea.  Staying very active.  Watches his diet .   June 08, 2016:  Charles Solis is seen back for follow up of his CAD  LDL particle number was 1229 in Jan. 2018: Taking Crestor 20 mg a day , Zetia 10 mg a day  Feeling well Has some      Past Medical History:  Diagnosis Date  .  Hypothyroidism   . Prostate cancer [facility])     Past Surgical History:  Procedure Laterality Date  . TONSILLECTOMY       Current Outpatient Prescriptions  Medication Sig Dispense Refill  . citalopram (CELEXA) 20 MG tablet     . ezetimibe (ZETIA) 10 MG tablet Take 1 tablet (10 mg total) by mouth daily. 30 tablet 11  . NATURE-THROID 65 MG tablet Take 65 mg by mouth daily.  2  . rosuvastatin (CRESTOR) 20 MG tablet Take 1 tablet (20 mg total) by mouth daily. 90 tablet 3   No current facility-administered medications for this visit.     Allergies:   Patient has no known allergies.    Social History:  The patient  reports that he has quit smoking. He has never used smokeless tobacco. He reports that he drinks alcohol. He reports that he does not use drugs.   Family History:  The patient's family history includes Alzheimer's disease in his mother and paternal grandmother; Colon cancer in his father; Coronary artery disease in his maternal grandfather; Heart attack in his father and maternal grandfather; Heart disease in his paternal grandfather; Hyperlipidemia in his sister; Hypertension in his mother; Lung cancer in his paternal grandfather.  ROS:  Please see the history of present illness.    Review of Systems: Constitutional:  denies fever, chills, diaphoresis, appetite change and fatigue.  HEENT: denies photophobia, eye pain, redness, hearing loss, ear pain, congestion, sore throat, rhinorrhea, sneezing, neck pain, neck stiffness and tinnitus.  Respiratory: denies SOB, DOE, cough, chest tightness, and wheezing.  Cardiovascular: denies chest pain, palpitations and leg swelling.  Gastrointestinal: denies nausea, vomiting, abdominal pain, diarrhea, constipation, blood in stool.  Genitourinary: denies dysuria, urgency, frequency, hematuria, flank pain and difficulty urinating.  Musculoskeletal: denies  myalgias, back pain, joint swelling, arthralgias and gait problem.   Skin: denies  pallor, rash and wound.  Neurological: denies dizziness, seizures, syncope, weakness, light-headedness, numbness and headaches.   Hematological: denies adenopathy, easy bruising, personal or family bleeding history.  Psychiatric/ Behavioral: denies suicidal ideation, mood changes, confusion, nervousness, sleep disturbance and agitation.       All other systems are reviewed and negative.    PHYSICAL EXAM: VS:  BP 120/86 (BP Location: Left Arm, Patient Position: Sitting, Cuff Size: Normal)   Pulse 86   Ht 6\' 1"  (1.854 m)   Wt 189 lb 12.8 oz (86.1 kg)   SpO2 94%   BMI 25.04 kg/m  , BMI Body mass index is 25.04 kg/m. GEN: Well nourished, well developed, in no acute distress  HEENT: normal  Neck: no JVD, carotid bruits, or masses Cardiac: RRR; no murmurs, rubs, or gallops,no edema  Respiratory:  clear to auscultation bilaterally, normal work of breathing GI: soft, nontender, nondistended, + BS MS: no deformity or atrophy  Skin: warm and dry, no rash Neuro:  Strength and sensation are intact Psych: normal   EKG:  EKG is not ordered today.    Recent Labs: 02/23/2016: ALT 41; BUN 25; Creatinine, Ser 0.88; Potassium 4.3; Sodium 141    Lipid Panel    Component Value Date/Time   CHOL 168 02/23/2016 1045   CHOL 170 07/21/2015 0916   TRIG 83 02/23/2016 1045   HDL 57 02/23/2016 1045   CHOLHDL 3.0 07/21/2015 0916   LDLCALC 95 07/21/2015 0916      Wt Readings from Last 3 Encounters:  06/08/16 189 lb 12.8 oz (86.1 kg)  11/23/15 183 lb 6.4 oz (83.2 kg)  04/20/15 184 lb (83.5 kg)      Other studies Reviewed: Additional studies/ records that were reviewed today include: . Review of the above records demonstrates:    ASSESSMENT AND PLAN:  1.  Hyperlipidemia: The patient resides with a history of hyperlipidemia. He does not have any episodes of chest pain. He does not have a strong family history of coronary artery disease. His father did have a heart attack at age 25.    crestor is helping  Original LDL particle number was 1890 His last LDL particle number is 1229.  Continue Crestor 20 mg a day and Zetia 10 mg a day .    Current medicines are reviewed at length with the patient today.  The patient does not have concerns regarding medicines.  The following changes have been made:  no change  Labs/ tests ordered today include:  No orders of the defined types were placed in this encounter.   Disposition:   FU with me in 6 months    Mertie Moores, MD  06/08/2016 11:18 AM    [address], [place], [place]  [address] Phone: [phone]; Fax: [phone]

## 2016-06-08 NOTE — Patient Instructions (Signed)

## 2016-06-15 DIAGNOSIS — J40 Bronchitis, not specified as acute or chronic: Secondary | ICD-10-CM | POA: Diagnosis not present

## 2016-07-29 DIAGNOSIS — H35371 Puckering of macula, right eye: Secondary | ICD-10-CM | POA: Diagnosis not present

## 2016-07-29 DIAGNOSIS — H524 Presbyopia: Secondary | ICD-10-CM | POA: Diagnosis not present

## 2016-07-29 DIAGNOSIS — H5203 Hypermetropia, bilateral: Secondary | ICD-10-CM | POA: Diagnosis not present

## 2016-07-29 DIAGNOSIS — H2513 Age-related nuclear cataract, bilateral: Secondary | ICD-10-CM | POA: Diagnosis not present

## 2016-10-06 DIAGNOSIS — D1801 Hemangioma of skin and subcutaneous tissue: Secondary | ICD-10-CM | POA: Diagnosis not present

## 2016-10-06 DIAGNOSIS — L858 Other specified epidermal thickening: Secondary | ICD-10-CM | POA: Diagnosis not present

## 2016-10-06 DIAGNOSIS — L821 Other seborrheic keratosis: Secondary | ICD-10-CM | POA: Diagnosis not present

## 2016-10-06 DIAGNOSIS — L570 Actinic keratosis: Secondary | ICD-10-CM | POA: Diagnosis not present

## 2016-10-06 DIAGNOSIS — Z85828 Personal history of other malignant neoplasm of skin: Secondary | ICD-10-CM | POA: Diagnosis not present

## 2016-10-06 DIAGNOSIS — D3617 Benign neoplasm of peripheral nerves and autonomic nervous system of trunk, unspecified: Secondary | ICD-10-CM | POA: Diagnosis not present

## 2016-10-06 DIAGNOSIS — D225 Melanocytic nevi of trunk: Secondary | ICD-10-CM | POA: Diagnosis not present

## 2016-10-19 DIAGNOSIS — C61 Malignant neoplasm of prostate: Secondary | ICD-10-CM | POA: Diagnosis not present

## 2016-10-26 DIAGNOSIS — C61 Malignant neoplasm of prostate: Secondary | ICD-10-CM | POA: Diagnosis not present

## 2016-10-26 DIAGNOSIS — N5201 Erectile dysfunction due to arterial insufficiency: Secondary | ICD-10-CM | POA: Diagnosis not present

## 2016-11-03 DIAGNOSIS — E7800 Pure hypercholesterolemia, unspecified: Secondary | ICD-10-CM | POA: Diagnosis not present

## 2016-11-03 DIAGNOSIS — E038 Other specified hypothyroidism: Secondary | ICD-10-CM | POA: Diagnosis not present

## 2016-11-03 DIAGNOSIS — C61 Malignant neoplasm of prostate: Secondary | ICD-10-CM | POA: Diagnosis not present

## 2016-11-03 DIAGNOSIS — C4490 Unspecified malignant neoplasm of skin, unspecified: Secondary | ICD-10-CM | POA: Diagnosis not present

## 2016-11-23 ENCOUNTER — Encounter (INDEPENDENT_AMBULATORY_CARE_PROVIDER_SITE_OTHER): Payer: Medicare Other

## 2016-11-23 ENCOUNTER — Ambulatory Visit (INDEPENDENT_AMBULATORY_CARE_PROVIDER_SITE_OTHER): Payer: Medicare Other | Admitting: Vascular Surgery

## 2016-11-28 ENCOUNTER — Other Ambulatory Visit: Payer: Self-pay

## 2016-11-28 MED ORDER — EZETIMIBE 10 MG PO TABS: 10.0000 mg | ORAL_TABLET | Freq: Every day | ORAL | 0 refills | Status: DC

## 2016-11-29 ENCOUNTER — Other Ambulatory Visit: Payer: Self-pay

## 2016-11-29 MED ORDER — EZETIMIBE 10 MG PO TABS: 10.0000 mg | ORAL_TABLET | Freq: Every day | ORAL | 0 refills | Status: DC

## 2016-12-02 MED ORDER — EZETIMIBE 10 MG PO TABS: 10.0000 mg | ORAL_TABLET | Freq: Every day | ORAL | 1 refills | Status: DC

## 2016-12-02 NOTE — Addendum Note (Signed)
Addended by: Derl Barrow on: 12/02/2016 08:24 AM   Modules accepted: Orders

## 2016-12-21 ENCOUNTER — Encounter: Payer: Self-pay | Admitting: Cardiovascular Disease

## 2016-12-21 ENCOUNTER — Ambulatory Visit (INDEPENDENT_AMBULATORY_CARE_PROVIDER_SITE_OTHER): Payer: Medicare Other | Admitting: Cardiovascular Disease

## 2016-12-21 ENCOUNTER — Other Ambulatory Visit: Payer: BLUE CROSS/BLUE SHIELD

## 2016-12-21 VITALS — BP 114/74 | HR 76 | Resp 16 | Ht 73.0 in | Wt 189.4 lb

## 2016-12-21 DIAGNOSIS — E782 Mixed hyperlipidemia: Secondary | ICD-10-CM | POA: Diagnosis not present

## 2016-12-21 NOTE — Progress Notes (Signed)
Cardiology Office Note   Date:  12/21/2016   ID:  Charles Solis, DOB 05/16/1950, MRN [id number]  PCP:  Vernie Shanks, MD  Cardiologist:   Mertie Moores, MD   Chief Complaint  Patient presents with  . Follow-up    hyperlipidemia    Problems: 1. Hyperlipidemia 2. hypothyroidism    Charles Solis is a 66 y.o. male who presents for hyperlipidemia.  Charles Solis has seen Dr. Adriana Simas over the past years.  He has had his TSH checked by Dr. Sharol Roussel and it is normal .   Was tried on Atorvastatin and had some mental sluggishness fairly shortly after that.  His recent lab work shows total cholesterol 253, triglyceride levels 133, LDL cholesterols 179.  His LDL particle number is 2349   Is otherwise healthy. Does have a family hx of CAD Father had MI and CABG at age 45     Has never had any chest pain . Eats fairly well.    Works out regularly .    04/20/2015: Charles Solis is seen today for follow-up of his hypercholesterolemia. Coronary calcium score:  IMPRESSION: Coronary calcium score of 50. This was 42 percentile for age and sex matched control.  Was started on Crestor - tolerating fairly well,   Did not tolerate Atorvastatin in the past. Had some brain foggyness.  He's cut his dose to Crestor 10 mg 3 times a week approximate 4 months ago. His LDL particle number has decreased slightly. In August his LDL particle number was 1890 After the crestor 3 times a week, it came down to 1261.   Oct. 9 , 2017   Charles Solis is see today for follow Up of his hyperlipidemia. Cardio IQ on June 6 revealed an LDL particle number of 1042.   He has a LDL pattern A which is desirable .  Takes Crestor 10 mg M,W, F, Sat.  No CP or dyspnea.  Staying very active.  Watches his diet .   June 08, 2016:  Charles Solis is seen back for follow up of his CAD  LDL particle number was 1229 in Jan. 2018: Taking Crestor 20 mg a day , Zetia 10 mg a day  Feeling well Has some   Nov. 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   Still exercising  Family hx of CAD but no known CAD in him    Past Medical History:  Diagnosis Date  . Hypothyroidism   . Prostate cancer [facility])     Past Surgical History:  Procedure Laterality Date  . TONSILLECTOMY       Current Outpatient Medications  Medication Sig Dispense Refill  . ezetimibe (ZETIA) 10 MG tablet Take 1 tablet (10 mg total) by mouth daily. 90 tablet 1  . rosuvastatin (CRESTOR) 20 MG tablet Take 1 tablet (20 mg total) by mouth daily. 90 tablet 3  . Saw Palmetto 500 MG CAPS Take 1 capsule 3 (three) times daily by mouth.     No current facility-administered medications for this visit.     Allergies:   Patient has no known allergies.    Social History:  The patient  reports that he has quit smoking. he has never used smokeless tobacco. He reports that he drinks alcohol. He reports that he does not use drugs.   Family History:  The patient's family history includes Alzheimer's disease in his mother and paternal grandmother; Colon cancer in his father; Coronary  artery disease in his maternal grandfather; Heart attack in his father and maternal grandfather; Heart disease in his paternal grandfather; Hyperlipidemia in his sister; Hypertension in his mother; Lung cancer in his paternal grandfather.    ROS:  Please see the history of present illness.    Review of Systems: As noted in HPI,  Otherwise negative     All other systems are reviewed and negative.   Physical Exam: Blood pressure 114/74, pulse 76, resp. rate 16, height 6\' 1"  (1.854 m), weight 189 lb 6.4 oz (85.9 kg), SpO2 98 %.  GEN:  Well nourished, well developed in no acute distress HEENT: Normal NECK: No JVD; No carotid bruits LYMPHATICS: No lymphadenopathy CARDIAC: RR, no murmurs, rubs, gallops RESPIRATORY:  Clear to auscultation without rales, wheezing or rhonchi  ABDOMEN: Soft, non-tender, non-distended MUSCULOSKELETAL:  No edema;  No deformity  SKIN: Warm and dry NEUROLOGIC:  Alert and oriented x 3    EKG:  EKG is ordered today.   NSR at 68.  No ST or T wave changes.     Recent Labs: 02/23/2016: ALT 41; BUN 25; Creatinine, Ser 0.88; Potassium 4.3; Sodium 141    Lipid Panel    Component Value Date/Time   CHOL 168 02/23/2016 1045   CHOL 170 07/21/2015 0916   TRIG 83 02/23/2016 1045   HDL 57 02/23/2016 1045   CHOLHDL 3.0 07/21/2015 0916   LDLCALC 95 07/21/2015 0916      Wt Readings from Last 3 Encounters:  12/21/16 189 lb 6.4 oz (85.9 kg)  06/08/16 189 lb 12.8 oz (86.1 kg)  11/23/15 183 lb 6.4 oz (83.2 kg)      Other studies Reviewed: Additional studies/ records that were reviewed today include: . Review of the above records demonstrates:    ASSESSMENT AND PLAN:  1.  Hyperlipidemia: .  Cont. Crestor and Zetia .  Check lipids., lifer, BMP , and Lipomed profile   Current medicines are reviewed at length with the patient today.  The patient does not have concerns regarding medicines.  The following changes have been made:  no change  Labs/ tests ordered today include:  No orders of the defined types were placed in this encounter.   Disposition:   FU with me in 6 months    Mertie Moores, MD  12/21/2016 9:33 AM    [address], [place], [place]  [address] Phone: [phone]; Fax: [phone]

## 2016-12-21 NOTE — Patient Instructions (Signed)
Medication Instructions:  Your physician recommends that you continue on your current medications as directed. Please refer to the Current Medication list given to you today.   Labwork: TODAY - cholesterol, lipomed profile, complete metabolic panel   Testing/Procedures: None Ordered   Follow-Up: Your physician wants you to follow-up in: 6 months with Dr. Acie Fredrickson.  You will receive a reminder letter in the mail two months in advance. If you don't receive a letter, please call our office to schedule the follow-up appointment.   If you need a refill on your cardiac medications before your next appointment, please call your pharmacy.   Thank you for choosing [facility]! Christen Bame, RN [phone]

## 2016-12-22 LAB — HEPATIC FUNCTION PANEL
ALBUMIN: 4.7 g/dL (ref 3.6–4.8)
ALK PHOS: 46 IU/L (ref 39–117)
ALT: 33 IU/L (ref 0–44)
AST: 35 IU/L (ref 0–40)
BILIRUBIN, DIRECT: 0.27 mg/dL (ref 0.00–0.40)
Bilirubin Total: 1.3 mg/dL — ABNORMAL HIGH (ref 0.0–1.2)
TOTAL PROTEIN: 6.3 g/dL (ref 6.0–8.5)

## 2016-12-22 LAB — NMR LIPOPROF + GRAPH
Cholesterol: 122 mg/dL (ref 100–199)
HDL CHOLESTEROL BY NMR: 51 mg/dL (ref >39)
HDL Particle Number: 35.8 umol/L (ref >=30.5)
LDL Particle Number: 834 nmol/L (ref <1000)
LDL Size: 19.9 nm (ref >20.5)
LDL-C: 59 mg/dL (ref 0–99)
LP-IR Score: 31 (ref <=45)
Small LDL Particle Number: 549 nmol/L — ABNORMAL HIGH (ref <=527)
TRIGLYCERIDES BY NMR: 61 mg/dL (ref 0–149)

## 2016-12-22 LAB — BASIC METABOLIC PANEL
BUN/Creatinine Ratio: 14 (ref 10–24)
BUN: 12 mg/dL (ref 8–27)
CALCIUM: 9.4 mg/dL (ref 8.6–10.2)
CO2: 27 mmol/L (ref 20–29)
CREATININE: 0.85 mg/dL (ref 0.76–1.27)
Chloride: 104 mmol/L (ref 96–106)
GFR calc Af Amer: 105 mL/min/{1.73_m2} (ref >59)
GFR, EST NON AFRICAN AMERICAN: 91 mL/min/{1.73_m2} (ref >59)
Glucose: 91 mg/dL (ref 65–99)
POTASSIUM: 5 mmol/L (ref 3.5–5.2)
Sodium: 142 mmol/L (ref 134–144)

## 2016-12-22 LAB — APOLIPOPROTEIN B: Apolipoprotein B: 71 mg/dL (ref 52–135)

## 2017-01-09 ENCOUNTER — Other Ambulatory Visit: Payer: Self-pay

## 2017-01-09 MED ORDER — ROSUVASTATIN CALCIUM 20 MG PO TABS: 20.0000 mg | ORAL_TABLET | Freq: Every day | ORAL | 3 refills | Status: DC

## 2017-01-25 DIAGNOSIS — H903 Sensorineural hearing loss, bilateral: Secondary | ICD-10-CM | POA: Diagnosis not present

## 2017-01-25 DIAGNOSIS — H838X3 Other specified diseases of inner ear, bilateral: Secondary | ICD-10-CM | POA: Diagnosis not present

## 2017-03-25 DIAGNOSIS — E559 Vitamin D deficiency, unspecified: Secondary | ICD-10-CM | POA: Diagnosis not present

## 2017-03-25 DIAGNOSIS — E039 Hypothyroidism, unspecified: Secondary | ICD-10-CM | POA: Diagnosis not present

## 2017-04-13 DIAGNOSIS — L821 Other seborrheic keratosis: Secondary | ICD-10-CM | POA: Diagnosis not present

## 2017-04-13 DIAGNOSIS — L570 Actinic keratosis: Secondary | ICD-10-CM | POA: Diagnosis not present

## 2017-04-13 DIAGNOSIS — L812 Freckles: Secondary | ICD-10-CM | POA: Diagnosis not present

## 2017-04-13 DIAGNOSIS — D1801 Hemangioma of skin and subcutaneous tissue: Secondary | ICD-10-CM | POA: Diagnosis not present

## 2017-04-13 DIAGNOSIS — Z85828 Personal history of other malignant neoplasm of skin: Secondary | ICD-10-CM | POA: Diagnosis not present

## 2017-05-11 DIAGNOSIS — C4490 Unspecified malignant neoplasm of skin, unspecified: Secondary | ICD-10-CM | POA: Diagnosis not present

## 2017-05-11 DIAGNOSIS — Z1389 Encounter for screening for other disorder: Secondary | ICD-10-CM | POA: Diagnosis not present

## 2017-05-11 DIAGNOSIS — E038 Other specified hypothyroidism: Secondary | ICD-10-CM | POA: Diagnosis not present

## 2017-05-11 DIAGNOSIS — E7800 Pure hypercholesterolemia, unspecified: Secondary | ICD-10-CM | POA: Diagnosis not present

## 2017-05-11 DIAGNOSIS — C61 Malignant neoplasm of prostate: Secondary | ICD-10-CM | POA: Diagnosis not present

## 2017-05-11 DIAGNOSIS — Z0000 Encounter for general adult medical examination without abnormal findings: Secondary | ICD-10-CM | POA: Diagnosis not present

## 2017-06-26 DIAGNOSIS — C61 Malignant neoplasm of prostate: Secondary | ICD-10-CM | POA: Diagnosis not present

## 2017-06-28 DIAGNOSIS — N5201 Erectile dysfunction due to arterial insufficiency: Secondary | ICD-10-CM | POA: Diagnosis not present

## 2017-06-28 DIAGNOSIS — N400 Enlarged prostate without lower urinary tract symptoms: Secondary | ICD-10-CM | POA: Diagnosis not present

## 2017-06-28 DIAGNOSIS — C61 Malignant neoplasm of prostate: Secondary | ICD-10-CM | POA: Diagnosis not present

## 2017-07-27 ENCOUNTER — Other Ambulatory Visit: Payer: Self-pay

## 2017-07-27 MED ORDER — ROSUVASTATIN CALCIUM 20 MG PO TABS: 20.0000 mg | ORAL_TABLET | Freq: Every day | ORAL | 1 refills | Status: DC

## 2017-07-28 DIAGNOSIS — H524 Presbyopia: Secondary | ICD-10-CM | POA: Diagnosis not present

## 2017-07-28 DIAGNOSIS — H5203 Hypermetropia, bilateral: Secondary | ICD-10-CM | POA: Diagnosis not present

## 2017-07-28 DIAGNOSIS — H35371 Puckering of macula, right eye: Secondary | ICD-10-CM | POA: Diagnosis not present

## 2017-07-28 DIAGNOSIS — H2513 Age-related nuclear cataract, bilateral: Secondary | ICD-10-CM | POA: Diagnosis not present

## 2017-08-14 DIAGNOSIS — T1511XA Foreign body in conjunctival sac, right eye, initial encounter: Secondary | ICD-10-CM | POA: Diagnosis not present

## 2017-08-31 DIAGNOSIS — M7130 Other bursal cyst, unspecified site: Secondary | ICD-10-CM | POA: Diagnosis not present

## 2017-08-31 DIAGNOSIS — Z85828 Personal history of other malignant neoplasm of skin: Secondary | ICD-10-CM | POA: Diagnosis not present

## 2017-10-12 DIAGNOSIS — D1801 Hemangioma of skin and subcutaneous tissue: Secondary | ICD-10-CM | POA: Diagnosis not present

## 2017-10-12 DIAGNOSIS — L812 Freckles: Secondary | ICD-10-CM | POA: Diagnosis not present

## 2017-10-12 DIAGNOSIS — L918 Other hypertrophic disorders of the skin: Secondary | ICD-10-CM | POA: Diagnosis not present

## 2017-10-12 DIAGNOSIS — Z85828 Personal history of other malignant neoplasm of skin: Secondary | ICD-10-CM | POA: Diagnosis not present

## 2017-10-12 DIAGNOSIS — M7130 Other bursal cyst, unspecified site: Secondary | ICD-10-CM | POA: Diagnosis not present

## 2017-10-12 DIAGNOSIS — L570 Actinic keratosis: Secondary | ICD-10-CM | POA: Diagnosis not present

## 2017-10-12 DIAGNOSIS — D3617 Benign neoplasm of peripheral nerves and autonomic nervous system of trunk, unspecified: Secondary | ICD-10-CM | POA: Diagnosis not present

## 2017-10-12 DIAGNOSIS — L821 Other seborrheic keratosis: Secondary | ICD-10-CM | POA: Diagnosis not present

## 2017-10-19 ENCOUNTER — Encounter: Payer: Self-pay | Admitting: Cardiovascular Disease

## 2017-10-19 ENCOUNTER — Ambulatory Visit (INDEPENDENT_AMBULATORY_CARE_PROVIDER_SITE_OTHER): Payer: Medicare Other | Admitting: Cardiovascular Disease

## 2017-10-19 VITALS — BP 134/82 | HR 70 | Ht 73.0 in | Wt 194.0 lb

## 2017-10-19 DIAGNOSIS — E782 Mixed hyperlipidemia: Secondary | ICD-10-CM

## 2017-10-19 NOTE — Patient Instructions (Signed)

## 2017-10-19 NOTE — Progress Notes (Signed)
Cardiology Office Note   Date:  10/19/2017   ID:  Charles Solis, DOB [birth year], MRN [id number]  PCP:  Charles Shanks, MD  Cardiologist:   Mertie Moores, MD   Chief Complaint  Patient presents with  . Hyperlipidemia   Problems: 1. Hyperlipidemia 2. hypothyroidism    Charles Solis is a 67 y.o. male who presents for hyperlipidemia.  Charles Solis has seen Dr. Adriana Simas over the past years.  He has had his TSH checked by Dr. Sharol Roussel and it is normal .   Was tried on Atorvastatin and had some mental sluggishness fairly shortly after that.  His recent lab work shows total cholesterol 253, triglyceride levels 133, LDL cholesterols 179.  His LDL particle number is 2349   Is otherwise healthy. Does have a family hx of CAD Father had MI and CABG at age 90     Has never had any chest pain . Eats fairly well.    Works out regularly .    04/20/2015: Charles Solis is seen today for follow-up of his hypercholesterolemia. Coronary calcium score:  IMPRESSION: Coronary calcium score of 50. This was 88 percentile for age and sex matched control.  Was started on Crestor - tolerating fairly well,   Did not tolerate Atorvastatin in the past. Had some brain foggyness.  He's cut his dose to Crestor 10 mg 3 times a week approximate 4 months ago. His LDL particle number has decreased slightly. In August his LDL particle number was 1890 After the crestor 3 times a week, it came down to 1261.   Oct. 9 , 2017   Charles Solis is see today for follow Up of his hyperlipidemia. Cardio IQ on June 6 revealed an LDL particle number of 1042.   He has a LDL pattern A which is desirable .  Takes Crestor 10 mg M,W, F, Sat.  No CP or dyspnea.  Staying very active.  Watches his diet .   June 08, 2016:  Charles Solis is seen back for follow up of his CAD  LDL particle number was 1229 in Jan. 2018: Taking Crestor 20 mg a day , Zetia 10 mg a day  Feeling well Has some   Nov. 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   Still exercising  Family hx of CAD but no known CAD in him   October 19, 2017:  Charles Solis is seen today for follow-up of his hyperlipidemia. Is on Rosuvastatin  Has put on some weight .  Trying to eat better   Past Medical History:  Diagnosis Date  . Hypothyroidism   . Prostate cancer [facility])     Past Surgical History:  Procedure Laterality Date  . TONSILLECTOMY       Current Outpatient Medications  Medication Sig Dispense Refill  . Influenza vac split quadrivalent PF (FLUZONE HIGH-DOSE) 0.5 ML injection Inject 5 mLs into the muscle once.    . rosuvastatin (CRESTOR) 20 MG tablet Take 1 tablet (20 mg total) by mouth daily. 90 tablet 1  . Saw Palmetto 500 MG CAPS Take 1 capsule 3 (three) times daily by mouth.    . ezetimibe (ZETIA) 10 MG tablet Take 1 tablet (10 mg total) by mouth daily. 90 tablet 1   No current facility-administered medications for this visit.     Allergies:   Patient has no known allergies.    Social History:  The patient  reports that he has quit smoking.  He has never used smokeless tobacco. He reports that he drinks alcohol. He reports that he does not use drugs.   Family History:  The patient's family history includes Alzheimer's disease in his mother and paternal grandmother; Colon cancer in his father; Coronary artery disease in his maternal grandfather; Heart attack in his father and maternal grandfather; Heart disease in his paternal grandfather; Hyperlipidemia in his sister; Hypertension in his mother; Lung cancer in his paternal grandfather.    ROS:  Please see the history of present illness.    Review of Systems: As noted in HPI,  Otherwise negative   Physical Exam: Blood pressure 134/82, pulse 70, height 6\' 1"  (1.854 m), weight 194 lb (88 kg).  GEN:  Well nourished, well developed in no acute distress HEENT: Normal NECK: No JVD; No carotid bruits LYMPHATICS: No lymphadenopathy CARDIAC: RRR     RESPIRATORY:  Clear to auscultation without rales, wheezing or rhonchi  ABDOMEN: Soft, non-tender, non-distended MUSCULOSKELETAL:  No edema; No deformity  SKIN: Warm and dry NEUROLOGIC:  Alert and oriented x 3  EKG: October 19, 2017: Normal sinus rhythm at 70.  Normal EKG.  Recent Labs: 12/21/2016: ALT 33; BUN 12; Creatinine, Ser 0.85; Potassium 5.0; Sodium 142    Lipid Panel    Component Value Date/Time   CHOL 122 12/21/2016 0955   CHOL 170 07/21/2015 0916   TRIG 61 12/21/2016 0955   HDL 51 12/21/2016 0955   CHOLHDL 3.0 07/21/2015 0916   LDLCALC 95 07/21/2015 0916      Wt Readings from Last 3 Encounters:  10/19/17 194 lb (88 kg)  12/21/16 189 lb 6.4 oz (85.9 kg)  06/08/16 189 lb 12.8 oz (86.1 kg)      Other studies Reviewed: Additional studies/ records that were reviewed today include: . Review of the above records demonstrates:    ASSESSMENT AND PLAN:  1.  Hyperlipidemia: .  Continue Crestor .   His LDL particle number at his last lab visit was 834.  This corresponds to an LDL of 59.  I think that we should L aim for an LDL of 60 or below.  This will keep his LDL particle number below 1000.  Continue rosuvastatin and Zetia 10 mg a day  for now.  He will follow-up with me in 1 year.    Current medicines are reviewed at length with the patient today.  The patient does not have concerns regarding medicines.  The following changes have been made:  no change  Labs/ tests ordered today include:  No orders of the defined types were placed in this encounter.   Disposition:   FU with me in 6 months    Mertie Moores, MD  10/19/2017 4:34 PM    [address], [place], [place]  [address] Phone: [phone]; Fax: [phone]

## 2017-10-20 ENCOUNTER — Encounter: Payer: Self-pay | Admitting: Cardiovascular Disease

## 2017-11-09 ENCOUNTER — Other Ambulatory Visit: Payer: Self-pay | Admitting: Cardiovascular Disease

## 2017-11-09 MED ORDER — EZETIMIBE 10 MG PO TABS: 10.0000 mg | ORAL_TABLET | Freq: Every day | ORAL | 3 refills | Status: DC

## 2017-12-07 DIAGNOSIS — I10 Essential (primary) hypertension: Secondary | ICD-10-CM | POA: Diagnosis not present

## 2017-12-07 DIAGNOSIS — M549 Dorsalgia, unspecified: Secondary | ICD-10-CM | POA: Diagnosis not present

## 2017-12-07 DIAGNOSIS — Z6825 Body mass index (BMI) 25.0-25.9, adult: Secondary | ICD-10-CM | POA: Diagnosis not present

## 2017-12-07 DIAGNOSIS — M4120 Other idiopathic scoliosis, site unspecified: Secondary | ICD-10-CM | POA: Diagnosis not present

## 2017-12-11 DIAGNOSIS — M5116 Intervertebral disc disorders with radiculopathy, lumbar region: Secondary | ICD-10-CM | POA: Diagnosis not present

## 2017-12-11 DIAGNOSIS — M5416 Radiculopathy, lumbar region: Secondary | ICD-10-CM | POA: Diagnosis not present

## 2017-12-26 DIAGNOSIS — M4120 Other idiopathic scoliosis, site unspecified: Secondary | ICD-10-CM | POA: Diagnosis not present

## 2017-12-26 DIAGNOSIS — M545 Low back pain: Secondary | ICD-10-CM | POA: Diagnosis not present

## 2018-01-18 DIAGNOSIS — M4150 Other secondary scoliosis, site unspecified: Secondary | ICD-10-CM | POA: Diagnosis not present

## 2018-01-24 DIAGNOSIS — H838X3 Other specified diseases of inner ear, bilateral: Secondary | ICD-10-CM | POA: Diagnosis not present

## 2018-01-24 DIAGNOSIS — H903 Sensorineural hearing loss, bilateral: Secondary | ICD-10-CM | POA: Diagnosis not present

## 2018-02-17 DIAGNOSIS — R531 Weakness: Secondary | ICD-10-CM | POA: Diagnosis not present

## 2018-02-17 DIAGNOSIS — R293 Abnormal posture: Secondary | ICD-10-CM | POA: Diagnosis not present

## 2018-02-17 DIAGNOSIS — M419 Scoliosis, unspecified: Secondary | ICD-10-CM | POA: Diagnosis not present

## 2018-02-26 ENCOUNTER — Other Ambulatory Visit: Payer: Self-pay | Admitting: Cardiovascular Disease

## 2018-03-02 DIAGNOSIS — M419 Scoliosis, unspecified: Secondary | ICD-10-CM | POA: Diagnosis not present

## 2018-03-02 DIAGNOSIS — R293 Abnormal posture: Secondary | ICD-10-CM | POA: Diagnosis not present

## 2018-03-02 DIAGNOSIS — R531 Weakness: Secondary | ICD-10-CM | POA: Diagnosis not present

## 2018-04-10 DIAGNOSIS — R531 Weakness: Secondary | ICD-10-CM | POA: Diagnosis not present

## 2018-04-10 DIAGNOSIS — M419 Scoliosis, unspecified: Secondary | ICD-10-CM | POA: Diagnosis not present

## 2018-04-10 DIAGNOSIS — R293 Abnormal posture: Secondary | ICD-10-CM | POA: Diagnosis not present

## 2018-04-16 DIAGNOSIS — C44612 Basal cell carcinoma of skin of right upper limb, including shoulder: Secondary | ICD-10-CM | POA: Diagnosis not present

## 2018-04-16 DIAGNOSIS — Z85828 Personal history of other malignant neoplasm of skin: Secondary | ICD-10-CM | POA: Diagnosis not present

## 2018-04-16 DIAGNOSIS — C44619 Basal cell carcinoma of skin of left upper limb, including shoulder: Secondary | ICD-10-CM | POA: Diagnosis not present

## 2018-04-16 DIAGNOSIS — D485 Neoplasm of uncertain behavior of skin: Secondary | ICD-10-CM | POA: Diagnosis not present

## 2018-04-16 DIAGNOSIS — L570 Actinic keratosis: Secondary | ICD-10-CM | POA: Diagnosis not present

## 2018-04-16 DIAGNOSIS — D225 Melanocytic nevi of trunk: Secondary | ICD-10-CM | POA: Diagnosis not present

## 2018-04-30 DIAGNOSIS — R531 Weakness: Secondary | ICD-10-CM | POA: Diagnosis not present

## 2018-04-30 DIAGNOSIS — R293 Abnormal posture: Secondary | ICD-10-CM | POA: Diagnosis not present

## 2018-04-30 DIAGNOSIS — M419 Scoliosis, unspecified: Secondary | ICD-10-CM | POA: Diagnosis not present

## 2018-06-25 DIAGNOSIS — C61 Malignant neoplasm of prostate: Secondary | ICD-10-CM | POA: Diagnosis not present

## 2018-07-03 DIAGNOSIS — M1711 Unilateral primary osteoarthritis, right knee: Secondary | ICD-10-CM | POA: Diagnosis not present

## 2018-07-03 DIAGNOSIS — R293 Abnormal posture: Secondary | ICD-10-CM | POA: Diagnosis not present

## 2018-07-03 DIAGNOSIS — R531 Weakness: Secondary | ICD-10-CM | POA: Diagnosis not present

## 2018-07-03 DIAGNOSIS — M419 Scoliosis, unspecified: Secondary | ICD-10-CM | POA: Diagnosis not present

## 2018-07-03 DIAGNOSIS — S83282A Other tear of lateral meniscus, current injury, left knee, initial encounter: Secondary | ICD-10-CM | POA: Diagnosis not present

## 2018-07-04 DIAGNOSIS — N400 Enlarged prostate without lower urinary tract symptoms: Secondary | ICD-10-CM | POA: Diagnosis not present

## 2018-07-04 DIAGNOSIS — C61 Malignant neoplasm of prostate: Secondary | ICD-10-CM | POA: Diagnosis not present

## 2018-07-24 DIAGNOSIS — R293 Abnormal posture: Secondary | ICD-10-CM | POA: Diagnosis not present

## 2018-07-24 DIAGNOSIS — M419 Scoliosis, unspecified: Secondary | ICD-10-CM | POA: Diagnosis not present

## 2018-07-24 DIAGNOSIS — R531 Weakness: Secondary | ICD-10-CM | POA: Diagnosis not present

## 2018-07-31 DIAGNOSIS — M1711 Unilateral primary osteoarthritis, right knee: Secondary | ICD-10-CM | POA: Diagnosis not present

## 2018-08-01 DIAGNOSIS — H2513 Age-related nuclear cataract, bilateral: Secondary | ICD-10-CM | POA: Diagnosis not present

## 2018-08-01 DIAGNOSIS — H35371 Puckering of macula, right eye: Secondary | ICD-10-CM | POA: Diagnosis not present

## 2018-08-01 DIAGNOSIS — H5203 Hypermetropia, bilateral: Secondary | ICD-10-CM | POA: Diagnosis not present

## 2018-08-01 DIAGNOSIS — H52203 Unspecified astigmatism, bilateral: Secondary | ICD-10-CM | POA: Diagnosis not present

## 2018-08-06 DIAGNOSIS — M419 Scoliosis, unspecified: Secondary | ICD-10-CM | POA: Diagnosis not present

## 2018-08-06 DIAGNOSIS — R531 Weakness: Secondary | ICD-10-CM | POA: Diagnosis not present

## 2018-08-06 DIAGNOSIS — R293 Abnormal posture: Secondary | ICD-10-CM | POA: Diagnosis not present

## 2018-08-20 DIAGNOSIS — R531 Weakness: Secondary | ICD-10-CM | POA: Diagnosis not present

## 2018-08-20 DIAGNOSIS — R293 Abnormal posture: Secondary | ICD-10-CM | POA: Diagnosis not present

## 2018-08-20 DIAGNOSIS — M419 Scoliosis, unspecified: Secondary | ICD-10-CM | POA: Diagnosis not present

## 2018-08-23 ENCOUNTER — Other Ambulatory Visit: Payer: Self-pay | Admitting: Cardiovascular Disease

## 2018-09-03 DIAGNOSIS — R531 Weakness: Secondary | ICD-10-CM | POA: Diagnosis not present

## 2018-09-03 DIAGNOSIS — R293 Abnormal posture: Secondary | ICD-10-CM | POA: Diagnosis not present

## 2018-09-03 DIAGNOSIS — M419 Scoliosis, unspecified: Secondary | ICD-10-CM | POA: Diagnosis not present

## 2018-09-24 ENCOUNTER — Telehealth: Payer: Self-pay | Admitting: Cardiovascular Disease

## 2018-09-24 DIAGNOSIS — M419 Scoliosis, unspecified: Secondary | ICD-10-CM | POA: Diagnosis not present

## 2018-09-24 DIAGNOSIS — R293 Abnormal posture: Secondary | ICD-10-CM | POA: Diagnosis not present

## 2018-09-24 DIAGNOSIS — R531 Weakness: Secondary | ICD-10-CM | POA: Diagnosis not present

## 2018-09-24 NOTE — Telephone Encounter (Signed)
We will plan to get lab work while patient is in the office. Will place orders at the time of his visit with Dr. Acie Fredrickson.

## 2018-09-24 NOTE — Telephone Encounter (Signed)
New Message     Pt wants to be scheduled for his labs the same day as his appt  the orders are to expire in September and His appt is in October

## 2018-10-05 DIAGNOSIS — M419 Scoliosis, unspecified: Secondary | ICD-10-CM | POA: Diagnosis not present

## 2018-10-05 DIAGNOSIS — R293 Abnormal posture: Secondary | ICD-10-CM | POA: Diagnosis not present

## 2018-10-05 DIAGNOSIS — R531 Weakness: Secondary | ICD-10-CM | POA: Diagnosis not present

## 2018-10-16 DIAGNOSIS — R531 Weakness: Secondary | ICD-10-CM | POA: Diagnosis not present

## 2018-10-16 DIAGNOSIS — M419 Scoliosis, unspecified: Secondary | ICD-10-CM | POA: Diagnosis not present

## 2018-10-16 DIAGNOSIS — R293 Abnormal posture: Secondary | ICD-10-CM | POA: Diagnosis not present

## 2018-10-19 DIAGNOSIS — M419 Scoliosis, unspecified: Secondary | ICD-10-CM | POA: Diagnosis not present

## 2018-10-19 DIAGNOSIS — R531 Weakness: Secondary | ICD-10-CM | POA: Diagnosis not present

## 2018-10-19 DIAGNOSIS — R293 Abnormal posture: Secondary | ICD-10-CM | POA: Diagnosis not present

## 2018-10-24 DIAGNOSIS — C44622 Squamous cell carcinoma of skin of right upper limb, including shoulder: Secondary | ICD-10-CM | POA: Diagnosis not present

## 2018-10-24 DIAGNOSIS — L814 Other melanin hyperpigmentation: Secondary | ICD-10-CM | POA: Diagnosis not present

## 2018-10-24 DIAGNOSIS — Z85828 Personal history of other malignant neoplasm of skin: Secondary | ICD-10-CM | POA: Diagnosis not present

## 2018-10-24 DIAGNOSIS — L821 Other seborrheic keratosis: Secondary | ICD-10-CM | POA: Diagnosis not present

## 2018-10-24 DIAGNOSIS — L570 Actinic keratosis: Secondary | ICD-10-CM | POA: Diagnosis not present

## 2018-10-24 DIAGNOSIS — D485 Neoplasm of uncertain behavior of skin: Secondary | ICD-10-CM | POA: Diagnosis not present

## 2018-10-24 DIAGNOSIS — D3617 Benign neoplasm of peripheral nerves and autonomic nervous system of trunk, unspecified: Secondary | ICD-10-CM | POA: Diagnosis not present

## 2018-10-24 DIAGNOSIS — D0462 Carcinoma in situ of skin of left upper limb, including shoulder: Secondary | ICD-10-CM | POA: Diagnosis not present

## 2018-10-29 DIAGNOSIS — M419 Scoliosis, unspecified: Secondary | ICD-10-CM | POA: Diagnosis not present

## 2018-10-29 DIAGNOSIS — R531 Weakness: Secondary | ICD-10-CM | POA: Diagnosis not present

## 2018-10-29 DIAGNOSIS — R293 Abnormal posture: Secondary | ICD-10-CM | POA: Diagnosis not present

## 2018-10-30 DIAGNOSIS — Z8546 Personal history of malignant neoplasm of prostate: Secondary | ICD-10-CM | POA: Diagnosis not present

## 2018-10-30 DIAGNOSIS — Z0000 Encounter for general adult medical examination without abnormal findings: Secondary | ICD-10-CM | POA: Diagnosis not present

## 2018-10-30 DIAGNOSIS — M419 Scoliosis, unspecified: Secondary | ICD-10-CM | POA: Diagnosis not present

## 2018-10-30 DIAGNOSIS — J310 Chronic rhinitis: Secondary | ICD-10-CM | POA: Diagnosis not present

## 2018-10-30 DIAGNOSIS — E039 Hypothyroidism, unspecified: Secondary | ICD-10-CM | POA: Diagnosis not present

## 2018-10-30 DIAGNOSIS — E559 Vitamin D deficiency, unspecified: Secondary | ICD-10-CM | POA: Diagnosis not present

## 2018-10-30 DIAGNOSIS — R3121 Asymptomatic microscopic hematuria: Secondary | ICD-10-CM | POA: Diagnosis not present

## 2018-10-30 DIAGNOSIS — Z136 Encounter for screening for cardiovascular disorders: Secondary | ICD-10-CM | POA: Diagnosis not present

## 2018-10-30 DIAGNOSIS — R030 Elevated blood-pressure reading, without diagnosis of hypertension: Secondary | ICD-10-CM | POA: Diagnosis not present

## 2018-10-30 DIAGNOSIS — I2510 Atherosclerotic heart disease of native coronary artery without angina pectoris: Secondary | ICD-10-CM | POA: Diagnosis not present

## 2018-10-30 DIAGNOSIS — Z131 Encounter for screening for diabetes mellitus: Secondary | ICD-10-CM | POA: Diagnosis not present

## 2018-10-30 DIAGNOSIS — E785 Hyperlipidemia, unspecified: Secondary | ICD-10-CM | POA: Diagnosis not present

## 2018-11-01 DIAGNOSIS — M419 Scoliosis, unspecified: Secondary | ICD-10-CM | POA: Diagnosis not present

## 2018-11-01 DIAGNOSIS — Z0000 Encounter for general adult medical examination without abnormal findings: Secondary | ICD-10-CM | POA: Diagnosis not present

## 2018-11-01 DIAGNOSIS — R5383 Other fatigue: Secondary | ICD-10-CM | POA: Diagnosis not present

## 2018-11-01 DIAGNOSIS — E559 Vitamin D deficiency, unspecified: Secondary | ICD-10-CM | POA: Diagnosis not present

## 2018-11-01 DIAGNOSIS — R293 Abnormal posture: Secondary | ICD-10-CM | POA: Diagnosis not present

## 2018-11-01 DIAGNOSIS — R531 Weakness: Secondary | ICD-10-CM | POA: Diagnosis not present

## 2018-11-01 DIAGNOSIS — Z131 Encounter for screening for diabetes mellitus: Secondary | ICD-10-CM | POA: Diagnosis not present

## 2018-11-01 DIAGNOSIS — E039 Hypothyroidism, unspecified: Secondary | ICD-10-CM | POA: Diagnosis not present

## 2018-11-05 ENCOUNTER — Other Ambulatory Visit: Payer: Self-pay | Admitting: Internal Medicine

## 2018-11-05 DIAGNOSIS — Z136 Encounter for screening for cardiovascular disorders: Secondary | ICD-10-CM

## 2018-11-13 ENCOUNTER — Ambulatory Visit
Admission: RE | Admit: 2018-11-13 | Discharge: 2018-11-13 | Disposition: A | Discharge status: Home / Self Care | Payer: Medicare Other | Source: Ambulatory Visit | Attending: Internal Medicine | Admitting: Internal Medicine

## 2018-11-13 DIAGNOSIS — Z136 Encounter for screening for cardiovascular disorders: Secondary | ICD-10-CM | POA: Diagnosis not present

## 2018-11-16 ENCOUNTER — Other Ambulatory Visit: Payer: Self-pay

## 2018-11-16 ENCOUNTER — Encounter: Payer: Self-pay | Admitting: Cardiovascular Disease

## 2018-11-16 ENCOUNTER — Ambulatory Visit (INDEPENDENT_AMBULATORY_CARE_PROVIDER_SITE_OTHER): Payer: Medicare Other | Admitting: Cardiovascular Disease

## 2018-11-16 VITALS — BP 118/88 | HR 78 | Ht 73.0 in | Wt 187.1 lb

## 2018-11-16 DIAGNOSIS — I10 Essential (primary) hypertension: Secondary | ICD-10-CM | POA: Diagnosis not present

## 2018-11-16 DIAGNOSIS — E782 Mixed hyperlipidemia: Secondary | ICD-10-CM

## 2018-11-16 MED ORDER — POTASSIUM CHLORIDE ER 10 MEQ PO TBCR: 10.0000 meq | EXTENDED_RELEASE_TABLET | Freq: Every day | ORAL | 3 refills | Status: DC

## 2018-11-16 MED ORDER — HYDROCHLOROTHIAZIDE 12.5 MG PO CAPS: 12.5000 mg | ORAL_CAPSULE | Freq: Every day | ORAL | 3 refills | Status: DC

## 2018-11-16 NOTE — Patient Instructions (Addendum)
Medication Instructions:  Your physician has recommended you make the following change in your medication:  START HCTZ (Hydrochlorothiazide) 12.5 mg once daily in the morning START Kdur (potassium chloride) 10 mEq once daily in the morning  If you need a refill on your cardiac medications before your next appointment, please call your pharmacy.   Lab work: TODAY- basic metabolic panel, liver panel, cholesterol  Your physician recommends that you return for lab work in: 3 weeks for basic metabolic panel on Friday October 23. You do not have to fast for this appointment and you may come in anytime between 7:30 am and 4:45 pm   If you have labs (blood work) drawn today and your tests are completely normal, you will receive your results only by: Marland Kitchen MyChart Message (if you have MyChart) OR . A paper copy in the mail If you have any lab test that is abnormal or we need to change your treatment, we will call you to review the results.   Testing/Procedures: None Ordered   Follow-Up: At [facility], you and your health needs are our priority.  As part of our continuing mission to provide you with exceptional heart care, we have created designated Provider Care Teams.  These Care Teams include your primary Cardiologist (physician) and Advanced Practice Providers (APPs -  Physician Assistants and Nurse Practitioners) who all work together to provide you with the care you need, when you need it. You will need a follow up appointment in:  1 years.  Please call our office 2 months in advance to schedule this appointment.  You may see Mertie Moores, MD or one of the following Advanced Practice Providers on your designated Care Team: Richardson Dopp, PA-C [place], [place] . Daune Perch, NP

## 2018-11-16 NOTE — Progress Notes (Signed)
Cardiology Office Note   Date:  11/16/2018   ID:  Charles Solis, DOB [birth year], MRN [id number]  PCP:  Audley Hose, MD  Cardiologist:   Mertie Moores, MD   Chief Complaint  Patient presents with  . Hyperlipidemia   Problems: 1. Hyperlipidemia 2. hypothyroidism    Charles Solis is a 68 y.o. male who presents for hyperlipidemia.  Charles Solis has seen Dr. Adriana Simas over the past years.  He has had his TSH checked by Dr. Sharol Roussel and it is normal .   Was tried on Atorvastatin and had some mental sluggishness fairly shortly after that.  His recent lab work shows total cholesterol 253, triglyceride levels 133, LDL cholesterols 179.  His LDL particle number is 2349   Is otherwise healthy. Does have a family hx of CAD Father had MI and CABG at age 92     Has never had any chest pain . Eats fairly well.    Works out regularly .    04/20/2015: Charles Solis is seen today for follow-up of his hypercholesterolemia. Coronary calcium score:  IMPRESSION: Coronary calcium score of 50. This was 66 percentile for age and sex matched control.  Was started on Crestor - tolerating fairly well,   Did not tolerate Atorvastatin in the past. Had some brain foggyness.  He's cut his dose to Crestor 10 mg 3 times a week approximate 4 months ago. His LDL particle number has decreased slightly. In August his LDL particle number was 1890 After the crestor 3 times a week, it came down to 1261.   Oct. 9 , 2017   Charles Solis is see today for follow Up of his hyperlipidemia. Cardio IQ on June 6 revealed an LDL particle number of 1042.   He has a LDL pattern A which is desirable .  Takes Crestor 10 mg M,W, F, Sat.  No CP or dyspnea.  Staying very active.  Watches his diet .   June 08, 2016:  Charles Solis is seen back for follow up of his CAD  LDL particle number was 1229 in Jan. 2018: Taking Crestor 20 mg a day , Zetia 10 mg a day  Feeling well Has some   Nov. 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   Still exercising  Family hx of CAD but no known CAD in him   October 19, 2017:  Charles Solis is seen today for follow-up of his hyperlipidemia. Is on Rosuvastatin  Has put on some weight .  Trying to eat better   November 16, 2018  No CP , no dyspna.  Does elliptical regulalry  Has been doing PT for back injury .  Still working  Will [employer] is 187 today   Past Medical History:  Diagnosis Date  . Hypothyroidism   . Prostate cancer [facility])     Past Surgical History:  Procedure Laterality Date  . TONSILLECTOMY       Current Outpatient Medications  Medication Sig Dispense Refill  . ezetimibe (ZETIA) 10 MG tablet TAKE ONE TABLET EVERY DAY 90 tablet 0  . Influenza vac split quadrivalent PF (FLUZONE HIGH-DOSE) 0.5 ML injection Inject 5 mLs into the muscle once.    . rosuvastatin (CRESTOR) 20 MG tablet TAKE 1 TABLET BY MOUTH DAILY 90 tablet 2  . Saw Palmetto 500 MG CAPS Take 1 capsule 3 (three) times daily by mouth.    . hydrochlorothiazide (MICROZIDE) 12.5 MG capsule  Take 1 capsule (12.5 mg total) by mouth daily. 90 capsule 3  . potassium chloride (KLOR-CON) 10 MEQ tablet Take 1 tablet (10 mEq total) by mouth daily. 90 tablet 3   No current facility-administered medications for this visit.     Allergies:   Patient has no known allergies.    Social History:  The patient  reports that he has quit smoking. He has never used smokeless tobacco. He reports current alcohol use. He reports that he does not use drugs.   Family History:  The patient's family history includes Alzheimer's disease in his mother and paternal grandmother; Colon cancer in his father; Coronary artery disease in his maternal grandfather; Heart attack in his father and maternal grandfather; Heart disease in his paternal grandfather; Hyperlipidemia in his sister; Hypertension in his mother; Lung cancer in his paternal grandfather.    ROS:  Please see the history of present  illness.    Review of Systems: As noted in HPI,  Otherwise negative  Physical Exam: Blood pressure 118/88, pulse 78, height 6\' 1"  (1.854 m), weight 187 lb 1.9 oz (84.9 kg), SpO2 98 %.  GEN:  Well nourished, well developed in no acute distress HEENT: Normal NECK: No JVD; No carotid bruits LYMPHATICS: No lymphadenopathy CARDIAC: RRR , no murmurs, rubs, gallops RESPIRATORY:  Clear to auscultation without rales, wheezing or rhonchi  ABDOMEN: Soft, non-tender, non-distended MUSCULOSKELETAL:  No edema; No deformity  SKIN: Warm and dry NEUROLOGIC:  Alert and oriented x 3   EKG: November 16, 2018: Normal sinus rhythm at 78.  Normal EKG. Recent Labs: No results found for requested labs within last 8760 hours.    Lipid Panel    Component Value Date/Time   CHOL 122 12/21/2016 0955   CHOL 170 07/21/2015 0916   TRIG 61 12/21/2016 0955   HDL 51 12/21/2016 0955   CHOLHDL 3.0 07/21/2015 0916   LDLCALC 95 07/21/2015 0916      Wt Readings from Last 3 Encounters:  11/16/18 187 lb 1.9 oz (84.9 kg)  10/19/17 194 lb (88 kg)  12/21/16 189 lb 6.4 oz (85.9 kg)      Other studies Reviewed: Additional studies/ records that were reviewed today include: . Review of the above records demonstrates:    ASSESSMENT AND PLAN:  1.  Hyperlipidemia: .  Continue rosuvastatin.  Will check labs today.  2.  Mild hypertension: His diastolic blood pressures typically in the 80s.  We will start him on HCTZ 12.5 mg a day as well as potassium chloride 10 mEq a day.  We will recheck a basic metabolic profile in 3 weeks.  I will see him in 1 year.  He will call us back if his diastolic blood pressure remains elevated.  If that is the case we will likely need to increase his HCTZ.    Current medicines are reviewed at length with the patient today.  The patient does not have concerns regarding medicines.  The following changes have been made:  no change  Labs/ tests ordered today include:   Orders  Placed This Encounter  Procedures  . Basic Metabolic Panel (BMET)  . Basic Metabolic Panel (BMET)  . Hepatic function panel  . Lipid Profile  . EKG 12-Lead    Disposition:   FU with me  1 year     Mertie Moores, MD  11/16/2018 5:53 PM    [facility] [place] [address], [place], [place]  [address] Phone: [phone]; Fax: [phone]

## 2018-11-17 LAB — BASIC METABOLIC PANEL
BUN/Creatinine Ratio: 15 (ref 10–24)
BUN: 13 mg/dL (ref 8–27)
CO2: 23 mmol/L (ref 20–29)
Calcium: 9.3 mg/dL (ref 8.6–10.2)
Chloride: 104 mmol/L (ref 96–106)
Creatinine, Ser: 0.89 mg/dL (ref 0.76–1.27)
GFR calc Af Amer: 102 mL/min/{1.73_m2} (ref >59)
GFR calc non Af Amer: 88 mL/min/{1.73_m2} (ref >59)
Glucose: 81 mg/dL (ref 65–99)
Potassium: 3.9 mmol/L (ref 3.5–5.2)
Sodium: 141 mmol/L (ref 134–144)

## 2018-11-17 LAB — HEPATIC FUNCTION PANEL
ALT: 34 IU/L (ref 0–44)
AST: 35 IU/L (ref 0–40)
Albumin: 4.6 g/dL (ref 3.8–4.8)
Alkaline Phosphatase: 56 IU/L (ref 39–117)
Bilirubin Total: 1.5 mg/dL — ABNORMAL HIGH (ref 0.0–1.2)
Bilirubin, Direct: 0.36 mg/dL (ref 0.00–0.40)
Total Protein: 6.4 g/dL (ref 6.0–8.5)

## 2018-11-17 LAB — LIPID PANEL
Chol/HDL Ratio: 2.7 ratio (ref 0.0–5.0)
Cholesterol, Total: 122 mg/dL (ref 100–199)
HDL: 46 mg/dL (ref >39)
LDL Chol Calc (NIH): 61 mg/dL (ref 0–99)
Triglycerides: 72 mg/dL (ref 0–149)
VLDL Cholesterol Cal: 15 mg/dL (ref 5–40)

## 2018-11-23 DIAGNOSIS — M419 Scoliosis, unspecified: Secondary | ICD-10-CM | POA: Diagnosis not present

## 2018-11-23 DIAGNOSIS — R531 Weakness: Secondary | ICD-10-CM | POA: Diagnosis not present

## 2018-11-23 DIAGNOSIS — R293 Abnormal posture: Secondary | ICD-10-CM | POA: Diagnosis not present

## 2018-11-26 DIAGNOSIS — R293 Abnormal posture: Secondary | ICD-10-CM | POA: Diagnosis not present

## 2018-11-26 DIAGNOSIS — M419 Scoliosis, unspecified: Secondary | ICD-10-CM | POA: Diagnosis not present

## 2018-11-26 DIAGNOSIS — R531 Weakness: Secondary | ICD-10-CM | POA: Diagnosis not present

## 2018-11-27 ENCOUNTER — Other Ambulatory Visit: Payer: Self-pay | Admitting: Cardiovascular Disease

## 2018-12-07 ENCOUNTER — Other Ambulatory Visit: Payer: Self-pay

## 2018-12-07 ENCOUNTER — Other Ambulatory Visit: Payer: Medicare Other | Admitting: *Deleted

## 2018-12-07 DIAGNOSIS — E782 Mixed hyperlipidemia: Secondary | ICD-10-CM

## 2018-12-07 DIAGNOSIS — I10 Essential (primary) hypertension: Secondary | ICD-10-CM | POA: Diagnosis not present

## 2018-12-07 DIAGNOSIS — Z23 Encounter for immunization: Secondary | ICD-10-CM | POA: Diagnosis not present

## 2018-12-07 LAB — BASIC METABOLIC PANEL
BUN/Creatinine Ratio: 16 (ref 10–24)
BUN: 16 mg/dL (ref 8–27)
CO2: 22 mmol/L (ref 20–29)
Calcium: 9.4 mg/dL (ref 8.6–10.2)
Chloride: 102 mmol/L (ref 96–106)
Creatinine, Ser: 0.97 mg/dL (ref 0.76–1.27)
GFR calc Af Amer: 92 mL/min/{1.73_m2} (ref >59)
GFR calc non Af Amer: 80 mL/min/{1.73_m2} (ref >59)
Glucose: 95 mg/dL (ref 65–99)
Potassium: 4.1 mmol/L (ref 3.5–5.2)
Sodium: 137 mmol/L (ref 134–144)

## 2018-12-21 DIAGNOSIS — M419 Scoliosis, unspecified: Secondary | ICD-10-CM | POA: Diagnosis not present

## 2018-12-21 DIAGNOSIS — R293 Abnormal posture: Secondary | ICD-10-CM | POA: Diagnosis not present

## 2018-12-21 DIAGNOSIS — R531 Weakness: Secondary | ICD-10-CM | POA: Diagnosis not present

## 2018-12-25 DIAGNOSIS — R531 Weakness: Secondary | ICD-10-CM | POA: Diagnosis not present

## 2018-12-25 DIAGNOSIS — R293 Abnormal posture: Secondary | ICD-10-CM | POA: Diagnosis not present

## 2018-12-25 DIAGNOSIS — M419 Scoliosis, unspecified: Secondary | ICD-10-CM | POA: Diagnosis not present

## 2019-01-01 DIAGNOSIS — R3121 Asymptomatic microscopic hematuria: Secondary | ICD-10-CM | POA: Diagnosis not present

## 2019-01-01 DIAGNOSIS — Z1159 Encounter for screening for other viral diseases: Secondary | ICD-10-CM | POA: Diagnosis not present

## 2019-01-01 DIAGNOSIS — R030 Elevated blood-pressure reading, without diagnosis of hypertension: Secondary | ICD-10-CM | POA: Diagnosis not present

## 2019-01-01 DIAGNOSIS — M419 Scoliosis, unspecified: Secondary | ICD-10-CM | POA: Diagnosis not present

## 2019-01-01 DIAGNOSIS — J310 Chronic rhinitis: Secondary | ICD-10-CM | POA: Diagnosis not present

## 2019-01-01 DIAGNOSIS — E039 Hypothyroidism, unspecified: Secondary | ICD-10-CM | POA: Diagnosis not present

## 2019-01-01 DIAGNOSIS — Z136 Encounter for screening for cardiovascular disorders: Secondary | ICD-10-CM | POA: Diagnosis not present

## 2019-01-01 DIAGNOSIS — E785 Hyperlipidemia, unspecified: Secondary | ICD-10-CM | POA: Diagnosis not present

## 2019-01-01 DIAGNOSIS — E559 Vitamin D deficiency, unspecified: Secondary | ICD-10-CM | POA: Diagnosis not present

## 2019-01-01 DIAGNOSIS — Z0000 Encounter for general adult medical examination without abnormal findings: Secondary | ICD-10-CM | POA: Diagnosis not present

## 2019-01-01 DIAGNOSIS — Z23 Encounter for immunization: Secondary | ICD-10-CM | POA: Diagnosis not present

## 2019-01-01 DIAGNOSIS — Z131 Encounter for screening for diabetes mellitus: Secondary | ICD-10-CM | POA: Diagnosis not present

## 2019-01-02 DIAGNOSIS — R293 Abnormal posture: Secondary | ICD-10-CM | POA: Diagnosis not present

## 2019-01-02 DIAGNOSIS — R531 Weakness: Secondary | ICD-10-CM | POA: Diagnosis not present

## 2019-01-02 DIAGNOSIS — M419 Scoliosis, unspecified: Secondary | ICD-10-CM | POA: Diagnosis not present

## 2019-01-08 DIAGNOSIS — R531 Weakness: Secondary | ICD-10-CM | POA: Diagnosis not present

## 2019-01-08 DIAGNOSIS — M419 Scoliosis, unspecified: Secondary | ICD-10-CM | POA: Diagnosis not present

## 2019-01-08 DIAGNOSIS — R293 Abnormal posture: Secondary | ICD-10-CM | POA: Diagnosis not present

## 2019-01-15 DIAGNOSIS — Z1159 Encounter for screening for other viral diseases: Secondary | ICD-10-CM | POA: Diagnosis not present

## 2019-01-18 DIAGNOSIS — M4150 Other secondary scoliosis, site unspecified: Secondary | ICD-10-CM | POA: Diagnosis not present

## 2019-01-18 DIAGNOSIS — M4120 Other idiopathic scoliosis, site unspecified: Secondary | ICD-10-CM | POA: Diagnosis not present

## 2019-01-25 DIAGNOSIS — M419 Scoliosis, unspecified: Secondary | ICD-10-CM | POA: Diagnosis not present

## 2019-01-25 DIAGNOSIS — R293 Abnormal posture: Secondary | ICD-10-CM | POA: Diagnosis not present

## 2019-01-25 DIAGNOSIS — R531 Weakness: Secondary | ICD-10-CM | POA: Diagnosis not present

## 2019-02-11 ENCOUNTER — Other Ambulatory Visit: Payer: Self-pay | Admitting: Cardiovascular Disease

## 2019-02-18 DIAGNOSIS — L570 Actinic keratosis: Secondary | ICD-10-CM | POA: Diagnosis not present

## 2019-02-18 DIAGNOSIS — Z85828 Personal history of other malignant neoplasm of skin: Secondary | ICD-10-CM | POA: Diagnosis not present

## 2019-02-27 DIAGNOSIS — R293 Abnormal posture: Secondary | ICD-10-CM | POA: Diagnosis not present

## 2019-02-27 DIAGNOSIS — M419 Scoliosis, unspecified: Secondary | ICD-10-CM | POA: Diagnosis not present

## 2019-02-27 DIAGNOSIS — R531 Weakness: Secondary | ICD-10-CM | POA: Diagnosis not present

## 2019-03-05 DIAGNOSIS — R531 Weakness: Secondary | ICD-10-CM | POA: Diagnosis not present

## 2019-03-05 DIAGNOSIS — R293 Abnormal posture: Secondary | ICD-10-CM | POA: Diagnosis not present

## 2019-03-05 DIAGNOSIS — M419 Scoliosis, unspecified: Secondary | ICD-10-CM | POA: Diagnosis not present

## 2019-03-22 DIAGNOSIS — R531 Weakness: Secondary | ICD-10-CM | POA: Diagnosis not present

## 2019-03-22 DIAGNOSIS — R293 Abnormal posture: Secondary | ICD-10-CM | POA: Diagnosis not present

## 2019-03-22 DIAGNOSIS — M419 Scoliosis, unspecified: Secondary | ICD-10-CM | POA: Diagnosis not present

## 2019-03-25 DIAGNOSIS — R293 Abnormal posture: Secondary | ICD-10-CM | POA: Diagnosis not present

## 2019-03-25 DIAGNOSIS — M419 Scoliosis, unspecified: Secondary | ICD-10-CM | POA: Diagnosis not present

## 2019-03-25 DIAGNOSIS — R531 Weakness: Secondary | ICD-10-CM | POA: Diagnosis not present

## 2019-04-12 DIAGNOSIS — M419 Scoliosis, unspecified: Secondary | ICD-10-CM | POA: Diagnosis not present

## 2019-04-12 DIAGNOSIS — R293 Abnormal posture: Secondary | ICD-10-CM | POA: Diagnosis not present

## 2019-04-12 DIAGNOSIS — R531 Weakness: Secondary | ICD-10-CM | POA: Diagnosis not present

## 2019-04-15 DIAGNOSIS — M419 Scoliosis, unspecified: Secondary | ICD-10-CM | POA: Diagnosis not present

## 2019-04-15 DIAGNOSIS — R531 Weakness: Secondary | ICD-10-CM | POA: Diagnosis not present

## 2019-04-15 DIAGNOSIS — R293 Abnormal posture: Secondary | ICD-10-CM | POA: Diagnosis not present

## 2019-04-30 DIAGNOSIS — L821 Other seborrheic keratosis: Secondary | ICD-10-CM | POA: Diagnosis not present

## 2019-04-30 DIAGNOSIS — L812 Freckles: Secondary | ICD-10-CM | POA: Diagnosis not present

## 2019-04-30 DIAGNOSIS — C44619 Basal cell carcinoma of skin of left upper limb, including shoulder: Secondary | ICD-10-CM | POA: Diagnosis not present

## 2019-04-30 DIAGNOSIS — D1801 Hemangioma of skin and subcutaneous tissue: Secondary | ICD-10-CM | POA: Diagnosis not present

## 2019-04-30 DIAGNOSIS — Z85828 Personal history of other malignant neoplasm of skin: Secondary | ICD-10-CM | POA: Diagnosis not present

## 2019-04-30 DIAGNOSIS — L570 Actinic keratosis: Secondary | ICD-10-CM | POA: Diagnosis not present

## 2019-04-30 DIAGNOSIS — D225 Melanocytic nevi of trunk: Secondary | ICD-10-CM | POA: Diagnosis not present

## 2019-04-30 DIAGNOSIS — D485 Neoplasm of uncertain behavior of skin: Secondary | ICD-10-CM | POA: Diagnosis not present

## 2019-05-03 DIAGNOSIS — R293 Abnormal posture: Secondary | ICD-10-CM | POA: Diagnosis not present

## 2019-05-03 DIAGNOSIS — M419 Scoliosis, unspecified: Secondary | ICD-10-CM | POA: Diagnosis not present

## 2019-05-03 DIAGNOSIS — R531 Weakness: Secondary | ICD-10-CM | POA: Diagnosis not present

## 2019-05-06 DIAGNOSIS — Z20822 Contact with and (suspected) exposure to covid-19: Secondary | ICD-10-CM | POA: Diagnosis not present

## 2019-05-08 DIAGNOSIS — M419 Scoliosis, unspecified: Secondary | ICD-10-CM | POA: Diagnosis not present

## 2019-05-08 DIAGNOSIS — R531 Weakness: Secondary | ICD-10-CM | POA: Diagnosis not present

## 2019-05-08 DIAGNOSIS — R293 Abnormal posture: Secondary | ICD-10-CM | POA: Diagnosis not present

## 2019-05-14 DIAGNOSIS — N401 Enlarged prostate with lower urinary tract symptoms: Secondary | ICD-10-CM | POA: Diagnosis not present

## 2019-05-14 DIAGNOSIS — R3915 Urgency of urination: Secondary | ICD-10-CM | POA: Diagnosis not present

## 2019-05-14 DIAGNOSIS — R350 Frequency of micturition: Secondary | ICD-10-CM | POA: Diagnosis not present

## 2019-05-14 DIAGNOSIS — C61 Malignant neoplasm of prostate: Secondary | ICD-10-CM | POA: Diagnosis not present

## 2019-05-14 DIAGNOSIS — R3912 Poor urinary stream: Secondary | ICD-10-CM | POA: Diagnosis not present

## 2019-05-14 DIAGNOSIS — R3916 Straining to void: Secondary | ICD-10-CM | POA: Diagnosis not present

## 2019-06-07 DIAGNOSIS — R293 Abnormal posture: Secondary | ICD-10-CM | POA: Diagnosis not present

## 2019-06-07 DIAGNOSIS — M419 Scoliosis, unspecified: Secondary | ICD-10-CM | POA: Diagnosis not present

## 2019-06-07 DIAGNOSIS — R531 Weakness: Secondary | ICD-10-CM | POA: Diagnosis not present

## 2019-06-10 DIAGNOSIS — R531 Weakness: Secondary | ICD-10-CM | POA: Diagnosis not present

## 2019-06-10 DIAGNOSIS — R293 Abnormal posture: Secondary | ICD-10-CM | POA: Diagnosis not present

## 2019-06-10 DIAGNOSIS — M419 Scoliosis, unspecified: Secondary | ICD-10-CM | POA: Diagnosis not present

## 2019-06-28 DIAGNOSIS — R531 Weakness: Secondary | ICD-10-CM | POA: Diagnosis not present

## 2019-06-28 DIAGNOSIS — M419 Scoliosis, unspecified: Secondary | ICD-10-CM | POA: Diagnosis not present

## 2019-06-28 DIAGNOSIS — R293 Abnormal posture: Secondary | ICD-10-CM | POA: Diagnosis not present

## 2019-07-01 DIAGNOSIS — R293 Abnormal posture: Secondary | ICD-10-CM | POA: Diagnosis not present

## 2019-07-01 DIAGNOSIS — R531 Weakness: Secondary | ICD-10-CM | POA: Diagnosis not present

## 2019-07-01 DIAGNOSIS — M419 Scoliosis, unspecified: Secondary | ICD-10-CM | POA: Diagnosis not present

## 2019-07-02 DIAGNOSIS — R3912 Poor urinary stream: Secondary | ICD-10-CM | POA: Diagnosis not present

## 2019-07-02 DIAGNOSIS — R3911 Hesitancy of micturition: Secondary | ICD-10-CM | POA: Diagnosis not present

## 2019-07-02 DIAGNOSIS — R350 Frequency of micturition: Secondary | ICD-10-CM | POA: Diagnosis not present

## 2019-07-02 DIAGNOSIS — N401 Enlarged prostate with lower urinary tract symptoms: Secondary | ICD-10-CM | POA: Diagnosis not present

## 2019-07-24 DIAGNOSIS — M419 Scoliosis, unspecified: Secondary | ICD-10-CM | POA: Diagnosis not present

## 2019-07-24 DIAGNOSIS — R531 Weakness: Secondary | ICD-10-CM | POA: Diagnosis not present

## 2019-07-24 DIAGNOSIS — R293 Abnormal posture: Secondary | ICD-10-CM | POA: Diagnosis not present

## 2019-07-26 DIAGNOSIS — R531 Weakness: Secondary | ICD-10-CM | POA: Diagnosis not present

## 2019-07-26 DIAGNOSIS — M419 Scoliosis, unspecified: Secondary | ICD-10-CM | POA: Diagnosis not present

## 2019-07-26 DIAGNOSIS — R293 Abnormal posture: Secondary | ICD-10-CM | POA: Diagnosis not present

## 2019-08-20 DIAGNOSIS — R531 Weakness: Secondary | ICD-10-CM | POA: Diagnosis not present

## 2019-08-20 DIAGNOSIS — M419 Scoliosis, unspecified: Secondary | ICD-10-CM | POA: Diagnosis not present

## 2019-08-20 DIAGNOSIS — R293 Abnormal posture: Secondary | ICD-10-CM | POA: Diagnosis not present

## 2019-08-23 DIAGNOSIS — M419 Scoliosis, unspecified: Secondary | ICD-10-CM | POA: Diagnosis not present

## 2019-08-23 DIAGNOSIS — R531 Weakness: Secondary | ICD-10-CM | POA: Diagnosis not present

## 2019-08-23 DIAGNOSIS — R293 Abnormal posture: Secondary | ICD-10-CM | POA: Diagnosis not present

## 2019-09-05 DIAGNOSIS — R531 Weakness: Secondary | ICD-10-CM | POA: Diagnosis not present

## 2019-09-05 DIAGNOSIS — M419 Scoliosis, unspecified: Secondary | ICD-10-CM | POA: Diagnosis not present

## 2019-09-05 DIAGNOSIS — R293 Abnormal posture: Secondary | ICD-10-CM | POA: Diagnosis not present

## 2019-09-09 DIAGNOSIS — M419 Scoliosis, unspecified: Secondary | ICD-10-CM | POA: Diagnosis not present

## 2019-09-09 DIAGNOSIS — R531 Weakness: Secondary | ICD-10-CM | POA: Diagnosis not present

## 2019-09-09 DIAGNOSIS — R293 Abnormal posture: Secondary | ICD-10-CM | POA: Diagnosis not present

## 2019-09-25 DIAGNOSIS — R293 Abnormal posture: Secondary | ICD-10-CM | POA: Diagnosis not present

## 2019-09-25 DIAGNOSIS — R531 Weakness: Secondary | ICD-10-CM | POA: Diagnosis not present

## 2019-09-25 DIAGNOSIS — M419 Scoliosis, unspecified: Secondary | ICD-10-CM | POA: Diagnosis not present

## 2019-09-30 DIAGNOSIS — R531 Weakness: Secondary | ICD-10-CM | POA: Diagnosis not present

## 2019-09-30 DIAGNOSIS — M419 Scoliosis, unspecified: Secondary | ICD-10-CM | POA: Diagnosis not present

## 2019-09-30 DIAGNOSIS — R293 Abnormal posture: Secondary | ICD-10-CM | POA: Diagnosis not present

## 2019-10-01 ENCOUNTER — Other Ambulatory Visit: Payer: Self-pay | Admitting: Cardiovascular Disease

## 2019-10-02 ENCOUNTER — Other Ambulatory Visit: Payer: Self-pay

## 2019-10-02 MED ORDER — ROSUVASTATIN CALCIUM 20 MG PO TABS: 20.0000 mg | ORAL_TABLET | Freq: Every day | ORAL | 0 refills | Status: DC

## 2019-10-03 DIAGNOSIS — N401 Enlarged prostate with lower urinary tract symptoms: Secondary | ICD-10-CM | POA: Diagnosis not present

## 2019-10-03 DIAGNOSIS — R351 Nocturia: Secondary | ICD-10-CM | POA: Diagnosis not present

## 2019-10-03 DIAGNOSIS — C61 Malignant neoplasm of prostate: Secondary | ICD-10-CM | POA: Diagnosis not present

## 2019-10-03 DIAGNOSIS — R3914 Feeling of incomplete bladder emptying: Secondary | ICD-10-CM | POA: Diagnosis not present

## 2019-10-11 ENCOUNTER — Other Ambulatory Visit: Payer: Medicare Other

## 2019-10-11 ENCOUNTER — Other Ambulatory Visit: Payer: Self-pay

## 2019-10-11 DIAGNOSIS — Z20822 Contact with and (suspected) exposure to covid-19: Secondary | ICD-10-CM

## 2019-10-13 LAB — NOVEL CORONAVIRUS, NAA: SARS-CoV-2, NAA: NOT DETECTED

## 2019-10-13 LAB — SARS-COV-2, NAA 2 DAY TAT

## 2019-10-16 DIAGNOSIS — M419 Scoliosis, unspecified: Secondary | ICD-10-CM | POA: Diagnosis not present

## 2019-10-16 DIAGNOSIS — R531 Weakness: Secondary | ICD-10-CM | POA: Diagnosis not present

## 2019-10-16 DIAGNOSIS — R293 Abnormal posture: Secondary | ICD-10-CM | POA: Diagnosis not present

## 2019-10-18 DIAGNOSIS — R531 Weakness: Secondary | ICD-10-CM | POA: Diagnosis not present

## 2019-10-18 DIAGNOSIS — M419 Scoliosis, unspecified: Secondary | ICD-10-CM | POA: Diagnosis not present

## 2019-10-18 DIAGNOSIS — R293 Abnormal posture: Secondary | ICD-10-CM | POA: Diagnosis not present

## 2019-11-05 DIAGNOSIS — Z85828 Personal history of other malignant neoplasm of skin: Secondary | ICD-10-CM | POA: Diagnosis not present

## 2019-11-05 DIAGNOSIS — L821 Other seborrheic keratosis: Secondary | ICD-10-CM | POA: Diagnosis not present

## 2019-11-05 DIAGNOSIS — L570 Actinic keratosis: Secondary | ICD-10-CM | POA: Diagnosis not present

## 2019-11-05 DIAGNOSIS — D225 Melanocytic nevi of trunk: Secondary | ICD-10-CM | POA: Diagnosis not present

## 2019-11-06 DIAGNOSIS — R531 Weakness: Secondary | ICD-10-CM | POA: Diagnosis not present

## 2019-11-06 DIAGNOSIS — M419 Scoliosis, unspecified: Secondary | ICD-10-CM | POA: Diagnosis not present

## 2019-11-06 DIAGNOSIS — R293 Abnormal posture: Secondary | ICD-10-CM | POA: Diagnosis not present

## 2019-11-12 DIAGNOSIS — R531 Weakness: Secondary | ICD-10-CM | POA: Diagnosis not present

## 2019-11-12 DIAGNOSIS — M419 Scoliosis, unspecified: Secondary | ICD-10-CM | POA: Diagnosis not present

## 2019-11-12 DIAGNOSIS — R293 Abnormal posture: Secondary | ICD-10-CM | POA: Diagnosis not present

## 2019-11-19 ENCOUNTER — Encounter: Payer: Self-pay | Admitting: Cardiovascular Disease

## 2019-11-19 ENCOUNTER — Other Ambulatory Visit: Payer: Self-pay

## 2019-11-19 ENCOUNTER — Ambulatory Visit (INDEPENDENT_AMBULATORY_CARE_PROVIDER_SITE_OTHER): Payer: Medicare Other | Admitting: Cardiovascular Disease

## 2019-11-19 VITALS — BP 122/76 | HR 92 | Ht 73.0 in | Wt 195.8 lb

## 2019-11-19 DIAGNOSIS — I10 Essential (primary) hypertension: Secondary | ICD-10-CM | POA: Diagnosis not present

## 2019-11-19 DIAGNOSIS — E782 Mixed hyperlipidemia: Secondary | ICD-10-CM | POA: Diagnosis not present

## 2019-11-19 NOTE — Progress Notes (Signed)
Cardiology Office Note   Date:  11/19/2019   ID:  Charles Solis, DOB 01-09-1951, MRN [id number]  PCP:  Audley Hose, MD  Cardiologist:   Mertie Moores, MD   Chief Complaint  Patient presents with  . Hyperlipidemia   Problems: 1. Hyperlipidemia 2. hypothyroidism    Charles Solis is a 69 y.o. male who presents for hyperlipidemia.  Charles Solis has seen Dr. Adriana Simas over the past years.  He has had his TSH checked by Dr. Sharol Roussel and it is normal .   Was tried on Atorvastatin and had some mental sluggishness fairly shortly after that.  His recent lab work shows total cholesterol 253, triglyceride levels 133, LDL cholesterols 179.  His LDL particle number is 2349   Is otherwise healthy. Does have a family hx of CAD Father had MI and CABG at age 33     Has never had any chest pain . Eats fairly well.    Works out regularly .    04/20/2015: Charles Solis is seen today for follow-up of his hypercholesterolemia. Coronary calcium score:  IMPRESSION: Coronary calcium score of 50. This was 41 percentile for age and sex matched control.  Was started on Crestor - tolerating fairly well,   Did not tolerate Atorvastatin in the past. Had some brain foggyness.  He's cut his dose to Crestor 10 mg 3 times a week approximate 4 months ago. His LDL particle number has decreased slightly. In August his LDL particle number was 1890 After the crestor 3 times a week, it came down to 1261.   Oct. 9 , 2017   Charles Solis is see today for follow Up of his hyperlipidemia. Cardio IQ on June 6 revealed an LDL particle number of 1042.   He has a LDL pattern A which is desirable .  Takes Crestor 10 mg M,W, F, Sat.  No CP or dyspnea.  Staying very active.  Watches his diet .   June 08, 2016:  Charles Solis is seen back for follow up of his CAD  LDL particle number was 1229 in Jan. 2018: Taking Crestor 20 mg a day , Zetia 10 mg a day  Feeling well Has some   Nov. 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   Still exercising  Family hx of CAD but no known CAD in him   October 19, 2017:  Charles Solis is seen today for follow-up of his hyperlipidemia. Is on Rosuvastatin  Has put on some weight .  Trying to eat better   November 16, 2018  No CP , no dyspna.  Does elliptical regulalry  Has been doing PT for back injury .  Still working  Will [employer] is 187 today   November 19, 2019: Charles Solis is seen today for follow-up of his hyperlipidemia. Wt is 185 lbs.  Working out regularly .    Past Medical History:  Diagnosis Date  . Hypothyroidism   . Prostate cancer [facility])     Past Surgical History:  Procedure Laterality Date  . TONSILLECTOMY       Current Outpatient Medications  Medication Sig Dispense Refill  . alfuzosin (UROXATRAL) 10 MG 24 hr tablet Take 10 mg by mouth daily.    Marland Kitchen ezetimibe (ZETIA) 10 MG tablet TAKE ONE TABLET BY MOUTH EVERY DAY 90 tablet 3  . hydrochlorothiazide (MICROZIDE) 12.5 MG capsule TAKE 1 CAPSULE BY MOUTH ONCE DAILY 90 capsule 2  .  ipratropium (ATROVENT) 0.03 % nasal spray Place 1 spray into both nostrils daily as needed.    . potassium chloride (KLOR-CON) 10 MEQ tablet TAKE 1 TABLET BY MOUTH DAILY 90 tablet 2  . rosuvastatin (CRESTOR) 20 MG tablet Take 1 tablet (20 mg total) by mouth daily. Please keep upcoming appt in October with Dr. Acie Fredrickson for future refills. Thank you 90 tablet 0  . Saw Palmetto 500 MG CAPS Take 1 capsule 3 (three) times daily by mouth.     No current facility-administered medications for this visit.    Allergies:   Patient has no known allergies.    Social History:  The patient  reports that he has quit smoking. He has never used smokeless tobacco. He reports current alcohol use. He reports that he does not use drugs.   Family History:  The patient's family history includes Alzheimer's disease in his mother and paternal grandmother; Colon cancer in his father; Coronary artery disease in his  maternal grandfather; Heart attack in his father and maternal grandfather; Heart disease in his paternal grandfather; Hyperlipidemia in his sister; Hypertension in his mother; Lung cancer in his paternal grandfather.    ROS:  Please see the history of present illness.    Review of Systems: As noted in HPI,  Otherwise negative  Physical Exam: Blood pressure 122/76, pulse 92, height 6\' 1"  (1.854 m), weight 195 lb 12.8 oz (88.8 kg), SpO2 98 %.  GEN:  Well nourished, well developed in no acute distress HEENT: Normal NECK: No JVD; No carotid bruits LYMPHATICS: No lymphadenopathy CARDIAC: RRR , no murmurs, rubs, gallops RESPIRATORY:  Clear to auscultation without rales, wheezing or rhonchi  ABDOMEN: Soft, non-tender, non-distended MUSCULOSKELETAL:  No edema; No deformity  SKIN: Warm and dry NEUROLOGIC:  Alert and oriented x 3    EKG: November 19, 2019: Normal sinus rhythm at 92.  No ST or T wave changes.  Recent Labs: 12/07/2018: BUN 16; Creatinine, Ser 0.97; Potassium 4.1; Sodium 137    Lipid Panel    Component Value Date/Time   CHOL 122 11/16/2018 1111   CHOL 170 07/21/2015 0916   TRIG 72 11/16/2018 1111   TRIG 61 12/21/2016 0955   HDL 46 11/16/2018 1111   HDL 51 12/21/2016 0955   CHOLHDL 2.7 11/16/2018 1111   CHOLHDL 3.0 07/21/2015 0916   LDLCALC 61 11/16/2018 1111   LDLCALC 95 07/21/2015 0916      Wt Readings from Last 3 Encounters:  11/19/19 195 lb 12.8 oz (88.8 kg)  11/16/18 187 lb 1.9 oz (84.9 kg)  10/19/17 194 lb (88 kg)      Other studies Reviewed: Additional studies/ records that were reviewed today include: . Review of the above records demonstrates:    ASSESSMENT AND PLAN:  1.  Hyperlipidemia: .  His last lipids look great.   Will recheck today   2.  Mild hypertension:   BP is well controlled.  He states that his heart rate seems to be a little bit elevated at times.  Is possible that the HCTZ is causing his heart rate to be reflexively higher.  We  will have him hold the HCTZ for a month or so and see if that corrects his heart rate.  We discussed because of starting him on a low-dose beta-blocker but he may not like the side effects of being on low-dose beta-blocker.  We will continue to follow.  For now his heart rate is not all that elevated so it is not a  concern currently.      Current medicines are reviewed at length with the patient today.  The patient does not have concerns regarding medicines.  The following changes have been made:  no change  Labs/ tests ordered today include:   No orders of the defined types were placed in this encounter.   Disposition:   FU with me  1 year     Mertie Moores, MD  11/19/2019 3:40 PM    [facility] [address], [place], [place]  [address] Phone: [phone]; Fax: [phone]

## 2019-11-19 NOTE — Patient Instructions (Signed)
Medication Instructions:  Your provider recommends that you continue on your current medications as directed. Please refer to the Current Medication list given to you today.   *If you need a refill on your cardiac medications before your next appointment, please call your pharmacy*  Lab Work: TODAY! Lipid, liver, BMET If you have labs (blood work) drawn today and your tests are completely normal, you will receive your results only by: . MyChart Message (if you have MyChart) OR . A paper copy in the mail If you have any lab test that is abnormal or we need to change your treatment, we will call you to review the results.  Follow-Up: At [facility], you and your health needs are our priority.  As part of our continuing mission to provide you with exceptional heart care, we have created designated Provider Care Teams.  These Care Teams include your primary Cardiologist (physician) and Advanced Practice Providers (APPs -  Physician Assistants and Nurse Practitioners) who all work together to provide you with the care you need, when you need it. Your next appointment:   12 month(s) The format for your next appointment:   In Person Provider:   You may see Philip Nahser, MD or one of the following Advanced Practice Providers on your designated Care Team:    Scott Weaver, PA-C  Vin Bhagat, PA-C   

## 2019-11-20 LAB — LIPID PANEL
Chol/HDL Ratio: 2.5 ratio (ref 0.0–5.0)
Cholesterol, Total: 112 mg/dL (ref 100–199)
HDL: 44 mg/dL (ref >39)
LDL Chol Calc (NIH): 53 mg/dL (ref 0–99)
Triglycerides: 73 mg/dL (ref 0–149)
VLDL Cholesterol Cal: 15 mg/dL (ref 5–40)

## 2019-11-20 LAB — BASIC METABOLIC PANEL
BUN/Creatinine Ratio: 13 (ref 10–24)
BUN: 14 mg/dL (ref 8–27)
CO2: 26 mmol/L (ref 20–29)
Calcium: 9.4 mg/dL (ref 8.6–10.2)
Chloride: 105 mmol/L (ref 96–106)
Creatinine, Ser: 1.09 mg/dL (ref 0.76–1.27)
GFR calc Af Amer: 80 mL/min/{1.73_m2} (ref >59)
GFR calc non Af Amer: 69 mL/min/{1.73_m2} (ref >59)
Glucose: 81 mg/dL (ref 65–99)
Potassium: 3.8 mmol/L (ref 3.5–5.2)
Sodium: 144 mmol/L (ref 134–144)

## 2019-11-20 LAB — HEPATIC FUNCTION PANEL
ALT: 23 IU/L (ref 0–44)
AST: 24 IU/L (ref 0–40)
Albumin: 4.5 g/dL (ref 3.8–4.8)
Alkaline Phosphatase: 48 IU/L (ref 44–121)
Bilirubin Total: 1.3 mg/dL — ABNORMAL HIGH (ref 0.0–1.2)
Bilirubin, Direct: 0.33 mg/dL (ref 0.00–0.40)
Total Protein: 6.3 g/dL (ref 6.0–8.5)

## 2019-11-21 DIAGNOSIS — Z23 Encounter for immunization: Secondary | ICD-10-CM | POA: Diagnosis not present

## 2019-11-27 DIAGNOSIS — M419 Scoliosis, unspecified: Secondary | ICD-10-CM | POA: Diagnosis not present

## 2019-11-27 DIAGNOSIS — R293 Abnormal posture: Secondary | ICD-10-CM | POA: Diagnosis not present

## 2019-11-27 DIAGNOSIS — R531 Weakness: Secondary | ICD-10-CM | POA: Diagnosis not present

## 2019-12-05 DIAGNOSIS — R531 Weakness: Secondary | ICD-10-CM | POA: Diagnosis not present

## 2019-12-05 DIAGNOSIS — R293 Abnormal posture: Secondary | ICD-10-CM | POA: Diagnosis not present

## 2019-12-05 DIAGNOSIS — M419 Scoliosis, unspecified: Secondary | ICD-10-CM | POA: Diagnosis not present

## 2019-12-18 DIAGNOSIS — R293 Abnormal posture: Secondary | ICD-10-CM | POA: Diagnosis not present

## 2019-12-18 DIAGNOSIS — R531 Weakness: Secondary | ICD-10-CM | POA: Diagnosis not present

## 2019-12-18 DIAGNOSIS — M419 Scoliosis, unspecified: Secondary | ICD-10-CM | POA: Diagnosis not present

## 2019-12-25 DIAGNOSIS — M419 Scoliosis, unspecified: Secondary | ICD-10-CM | POA: Diagnosis not present

## 2019-12-25 DIAGNOSIS — R293 Abnormal posture: Secondary | ICD-10-CM | POA: Diagnosis not present

## 2019-12-25 DIAGNOSIS — R531 Weakness: Secondary | ICD-10-CM | POA: Diagnosis not present

## 2020-01-02 ENCOUNTER — Other Ambulatory Visit: Payer: Self-pay | Admitting: Cardiovascular Disease

## 2020-01-08 DIAGNOSIS — R293 Abnormal posture: Secondary | ICD-10-CM | POA: Diagnosis not present

## 2020-01-08 DIAGNOSIS — R531 Weakness: Secondary | ICD-10-CM | POA: Diagnosis not present

## 2020-01-08 DIAGNOSIS — M419 Scoliosis, unspecified: Secondary | ICD-10-CM | POA: Diagnosis not present

## 2020-01-15 DIAGNOSIS — R293 Abnormal posture: Secondary | ICD-10-CM | POA: Diagnosis not present

## 2020-01-15 DIAGNOSIS — M419 Scoliosis, unspecified: Secondary | ICD-10-CM | POA: Diagnosis not present

## 2020-01-15 DIAGNOSIS — R531 Weakness: Secondary | ICD-10-CM | POA: Diagnosis not present

## 2020-01-17 DIAGNOSIS — M4120 Other idiopathic scoliosis, site unspecified: Secondary | ICD-10-CM | POA: Diagnosis not present

## 2020-01-24 DIAGNOSIS — R293 Abnormal posture: Secondary | ICD-10-CM | POA: Diagnosis not present

## 2020-01-24 DIAGNOSIS — M4120 Other idiopathic scoliosis, site unspecified: Secondary | ICD-10-CM | POA: Diagnosis not present

## 2020-01-24 DIAGNOSIS — M419 Scoliosis, unspecified: Secondary | ICD-10-CM | POA: Diagnosis not present

## 2020-01-24 DIAGNOSIS — R531 Weakness: Secondary | ICD-10-CM | POA: Diagnosis not present

## 2020-02-03 DIAGNOSIS — R531 Weakness: Secondary | ICD-10-CM | POA: Diagnosis not present

## 2020-02-03 DIAGNOSIS — R293 Abnormal posture: Secondary | ICD-10-CM | POA: Diagnosis not present

## 2020-02-03 DIAGNOSIS — M419 Scoliosis, unspecified: Secondary | ICD-10-CM | POA: Diagnosis not present

## 2020-02-03 DIAGNOSIS — M4120 Other idiopathic scoliosis, site unspecified: Secondary | ICD-10-CM | POA: Diagnosis not present

## 2020-02-18 DIAGNOSIS — M4120 Other idiopathic scoliosis, site unspecified: Secondary | ICD-10-CM | POA: Diagnosis not present

## 2020-02-18 DIAGNOSIS — R531 Weakness: Secondary | ICD-10-CM | POA: Diagnosis not present

## 2020-02-18 DIAGNOSIS — R293 Abnormal posture: Secondary | ICD-10-CM | POA: Diagnosis not present

## 2020-02-18 DIAGNOSIS — M419 Scoliosis, unspecified: Secondary | ICD-10-CM | POA: Diagnosis not present

## 2020-02-24 DIAGNOSIS — R293 Abnormal posture: Secondary | ICD-10-CM | POA: Diagnosis not present

## 2020-02-24 DIAGNOSIS — M4120 Other idiopathic scoliosis, site unspecified: Secondary | ICD-10-CM | POA: Diagnosis not present

## 2020-02-24 DIAGNOSIS — M419 Scoliosis, unspecified: Secondary | ICD-10-CM | POA: Diagnosis not present

## 2020-02-24 DIAGNOSIS — R531 Weakness: Secondary | ICD-10-CM | POA: Diagnosis not present

## 2020-03-09 DIAGNOSIS — R531 Weakness: Secondary | ICD-10-CM | POA: Diagnosis not present

## 2020-03-09 DIAGNOSIS — M4120 Other idiopathic scoliosis, site unspecified: Secondary | ICD-10-CM | POA: Diagnosis not present

## 2020-03-09 DIAGNOSIS — R293 Abnormal posture: Secondary | ICD-10-CM | POA: Diagnosis not present

## 2020-03-09 DIAGNOSIS — M419 Scoliosis, unspecified: Secondary | ICD-10-CM | POA: Diagnosis not present

## 2020-03-11 DIAGNOSIS — H903 Sensorineural hearing loss, bilateral: Secondary | ICD-10-CM | POA: Diagnosis not present

## 2020-03-11 DIAGNOSIS — H838X3 Other specified diseases of inner ear, bilateral: Secondary | ICD-10-CM | POA: Diagnosis not present

## 2020-03-16 DIAGNOSIS — R293 Abnormal posture: Secondary | ICD-10-CM | POA: Diagnosis not present

## 2020-03-16 DIAGNOSIS — M4120 Other idiopathic scoliosis, site unspecified: Secondary | ICD-10-CM | POA: Diagnosis not present

## 2020-03-16 DIAGNOSIS — R531 Weakness: Secondary | ICD-10-CM | POA: Diagnosis not present

## 2020-03-16 DIAGNOSIS — M419 Scoliosis, unspecified: Secondary | ICD-10-CM | POA: Diagnosis not present

## 2020-03-30 DIAGNOSIS — M4120 Other idiopathic scoliosis, site unspecified: Secondary | ICD-10-CM | POA: Diagnosis not present

## 2020-03-30 DIAGNOSIS — R531 Weakness: Secondary | ICD-10-CM | POA: Diagnosis not present

## 2020-03-30 DIAGNOSIS — M419 Scoliosis, unspecified: Secondary | ICD-10-CM | POA: Diagnosis not present

## 2020-03-30 DIAGNOSIS — R293 Abnormal posture: Secondary | ICD-10-CM | POA: Diagnosis not present

## 2020-04-03 DIAGNOSIS — Z125 Encounter for screening for malignant neoplasm of prostate: Secondary | ICD-10-CM | POA: Diagnosis not present

## 2020-04-03 DIAGNOSIS — Z136 Encounter for screening for cardiovascular disorders: Secondary | ICD-10-CM | POA: Diagnosis not present

## 2020-04-03 DIAGNOSIS — R3121 Asymptomatic microscopic hematuria: Secondary | ICD-10-CM | POA: Diagnosis not present

## 2020-04-03 DIAGNOSIS — K644 Residual hemorrhoidal skin tags: Secondary | ICD-10-CM | POA: Diagnosis not present

## 2020-04-03 DIAGNOSIS — E039 Hypothyroidism, unspecified: Secondary | ICD-10-CM | POA: Diagnosis not present

## 2020-04-03 DIAGNOSIS — E559 Vitamin D deficiency, unspecified: Secondary | ICD-10-CM | POA: Diagnosis not present

## 2020-04-03 DIAGNOSIS — N401 Enlarged prostate with lower urinary tract symptoms: Secondary | ICD-10-CM | POA: Diagnosis not present

## 2020-04-03 DIAGNOSIS — J310 Chronic rhinitis: Secondary | ICD-10-CM | POA: Diagnosis not present

## 2020-04-03 DIAGNOSIS — Z131 Encounter for screening for diabetes mellitus: Secondary | ICD-10-CM | POA: Diagnosis not present

## 2020-04-03 DIAGNOSIS — E785 Hyperlipidemia, unspecified: Secondary | ICD-10-CM | POA: Diagnosis not present

## 2020-04-03 DIAGNOSIS — M419 Scoliosis, unspecified: Secondary | ICD-10-CM | POA: Diagnosis not present

## 2020-04-03 DIAGNOSIS — Z0001 Encounter for general adult medical examination with abnormal findings: Secondary | ICD-10-CM | POA: Diagnosis not present

## 2020-04-03 DIAGNOSIS — Z23 Encounter for immunization: Secondary | ICD-10-CM | POA: Diagnosis not present

## 2020-04-03 DIAGNOSIS — R030 Elevated blood-pressure reading, without diagnosis of hypertension: Secondary | ICD-10-CM | POA: Diagnosis not present

## 2020-04-03 DIAGNOSIS — Z1211 Encounter for screening for malignant neoplasm of colon: Secondary | ICD-10-CM | POA: Diagnosis not present

## 2020-04-03 DIAGNOSIS — I2510 Atherosclerotic heart disease of native coronary artery without angina pectoris: Secondary | ICD-10-CM | POA: Diagnosis not present

## 2020-04-06 DIAGNOSIS — R293 Abnormal posture: Secondary | ICD-10-CM | POA: Diagnosis not present

## 2020-04-06 DIAGNOSIS — R531 Weakness: Secondary | ICD-10-CM | POA: Diagnosis not present

## 2020-04-06 DIAGNOSIS — M419 Scoliosis, unspecified: Secondary | ICD-10-CM | POA: Diagnosis not present

## 2020-04-06 DIAGNOSIS — M4120 Other idiopathic scoliosis, site unspecified: Secondary | ICD-10-CM | POA: Diagnosis not present

## 2020-04-21 DIAGNOSIS — Z03818 Encounter for observation for suspected exposure to other biological agents ruled out: Secondary | ICD-10-CM | POA: Diagnosis not present

## 2020-04-21 DIAGNOSIS — Z20822 Contact with and (suspected) exposure to covid-19: Secondary | ICD-10-CM | POA: Diagnosis not present

## 2020-05-01 DIAGNOSIS — M419 Scoliosis, unspecified: Secondary | ICD-10-CM | POA: Diagnosis not present

## 2020-05-01 DIAGNOSIS — R293 Abnormal posture: Secondary | ICD-10-CM | POA: Diagnosis not present

## 2020-05-01 DIAGNOSIS — M4120 Other idiopathic scoliosis, site unspecified: Secondary | ICD-10-CM | POA: Diagnosis not present

## 2020-05-01 DIAGNOSIS — R531 Weakness: Secondary | ICD-10-CM | POA: Diagnosis not present

## 2020-05-04 DIAGNOSIS — L812 Freckles: Secondary | ICD-10-CM | POA: Diagnosis not present

## 2020-05-04 DIAGNOSIS — C44619 Basal cell carcinoma of skin of left upper limb, including shoulder: Secondary | ICD-10-CM | POA: Diagnosis not present

## 2020-05-04 DIAGNOSIS — C44719 Basal cell carcinoma of skin of left lower limb, including hip: Secondary | ICD-10-CM | POA: Diagnosis not present

## 2020-05-04 DIAGNOSIS — Z85828 Personal history of other malignant neoplasm of skin: Secondary | ICD-10-CM | POA: Diagnosis not present

## 2020-05-04 DIAGNOSIS — D485 Neoplasm of uncertain behavior of skin: Secondary | ICD-10-CM | POA: Diagnosis not present

## 2020-05-04 DIAGNOSIS — D1801 Hemangioma of skin and subcutaneous tissue: Secondary | ICD-10-CM | POA: Diagnosis not present

## 2020-05-04 DIAGNOSIS — L821 Other seborrheic keratosis: Secondary | ICD-10-CM | POA: Diagnosis not present

## 2020-05-04 DIAGNOSIS — L570 Actinic keratosis: Secondary | ICD-10-CM | POA: Diagnosis not present

## 2020-05-04 DIAGNOSIS — H61002 Unspecified perichondritis of left external ear: Secondary | ICD-10-CM | POA: Diagnosis not present

## 2020-05-07 DIAGNOSIS — M4120 Other idiopathic scoliosis, site unspecified: Secondary | ICD-10-CM | POA: Diagnosis not present

## 2020-05-07 DIAGNOSIS — R293 Abnormal posture: Secondary | ICD-10-CM | POA: Diagnosis not present

## 2020-05-07 DIAGNOSIS — R531 Weakness: Secondary | ICD-10-CM | POA: Diagnosis not present

## 2020-05-07 DIAGNOSIS — M419 Scoliosis, unspecified: Secondary | ICD-10-CM | POA: Diagnosis not present

## 2020-05-12 DIAGNOSIS — R350 Frequency of micturition: Secondary | ICD-10-CM | POA: Diagnosis not present

## 2020-05-12 DIAGNOSIS — N401 Enlarged prostate with lower urinary tract symptoms: Secondary | ICD-10-CM | POA: Diagnosis not present

## 2020-05-12 DIAGNOSIS — R3912 Poor urinary stream: Secondary | ICD-10-CM | POA: Diagnosis not present

## 2020-05-12 DIAGNOSIS — R3916 Straining to void: Secondary | ICD-10-CM | POA: Diagnosis not present

## 2020-05-12 DIAGNOSIS — R3911 Hesitancy of micturition: Secondary | ICD-10-CM | POA: Diagnosis not present

## 2020-05-12 DIAGNOSIS — R351 Nocturia: Secondary | ICD-10-CM | POA: Diagnosis not present

## 2020-05-12 DIAGNOSIS — C61 Malignant neoplasm of prostate: Secondary | ICD-10-CM | POA: Diagnosis not present

## 2020-05-18 DIAGNOSIS — R293 Abnormal posture: Secondary | ICD-10-CM | POA: Diagnosis not present

## 2020-05-18 DIAGNOSIS — M4120 Other idiopathic scoliosis, site unspecified: Secondary | ICD-10-CM | POA: Diagnosis not present

## 2020-05-18 DIAGNOSIS — M419 Scoliosis, unspecified: Secondary | ICD-10-CM | POA: Diagnosis not present

## 2020-05-18 DIAGNOSIS — R531 Weakness: Secondary | ICD-10-CM | POA: Diagnosis not present

## 2020-05-25 DIAGNOSIS — M4120 Other idiopathic scoliosis, site unspecified: Secondary | ICD-10-CM | POA: Diagnosis not present

## 2020-05-25 DIAGNOSIS — R531 Weakness: Secondary | ICD-10-CM | POA: Diagnosis not present

## 2020-05-25 DIAGNOSIS — R293 Abnormal posture: Secondary | ICD-10-CM | POA: Diagnosis not present

## 2020-05-25 DIAGNOSIS — M419 Scoliosis, unspecified: Secondary | ICD-10-CM | POA: Diagnosis not present

## 2020-06-02 DIAGNOSIS — Z23 Encounter for immunization: Secondary | ICD-10-CM | POA: Diagnosis not present

## 2020-06-08 DIAGNOSIS — R293 Abnormal posture: Secondary | ICD-10-CM | POA: Diagnosis not present

## 2020-06-08 DIAGNOSIS — M419 Scoliosis, unspecified: Secondary | ICD-10-CM | POA: Diagnosis not present

## 2020-06-08 DIAGNOSIS — M4120 Other idiopathic scoliosis, site unspecified: Secondary | ICD-10-CM | POA: Diagnosis not present

## 2020-06-08 DIAGNOSIS — R531 Weakness: Secondary | ICD-10-CM | POA: Diagnosis not present

## 2020-06-12 IMAGING — US US AORTA
1 series · 12 of 12 positions shown · non-contrast
Comparison: Ultrasound abdomen 03/09/2012

CLINICAL DATA: Screening for cardiovascular disease

EXAM:
ULTRASOUND OF ABDOMINAL AORTA
TECHNIQUE: Ultrasound examination of the abdominal aorta and proximal common
iliac arteries was performed to evaluate for aneurysm. Additional
color and Doppler images of the distal aorta were obtained to
document patency.

[Series 1: us aorta · 0.23mm/px · 12 of 12 slices shown]
[im 1/12]
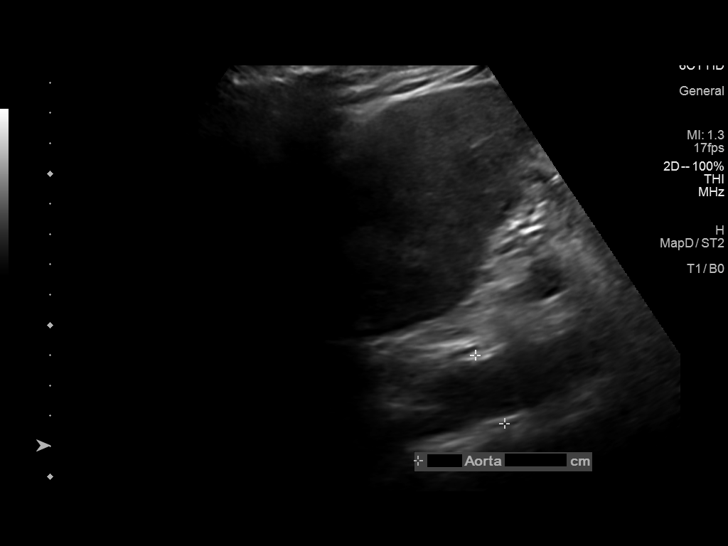
[im 2/12]
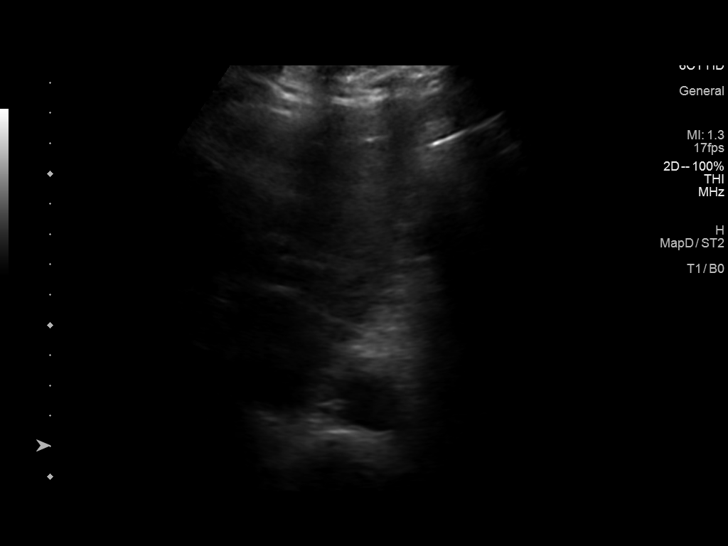
[im 3/12]
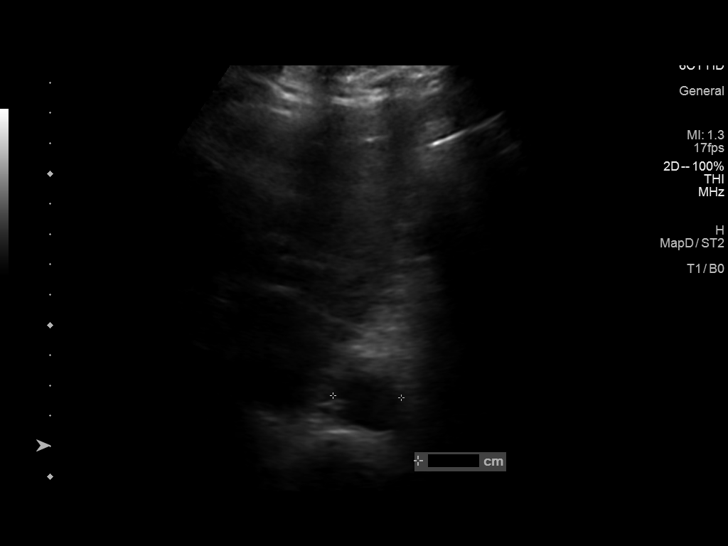
[im 4/12]
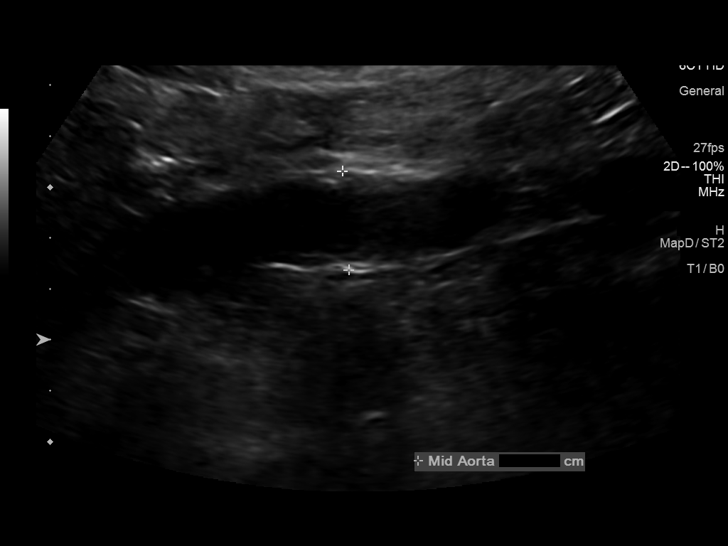
[im 5/12]
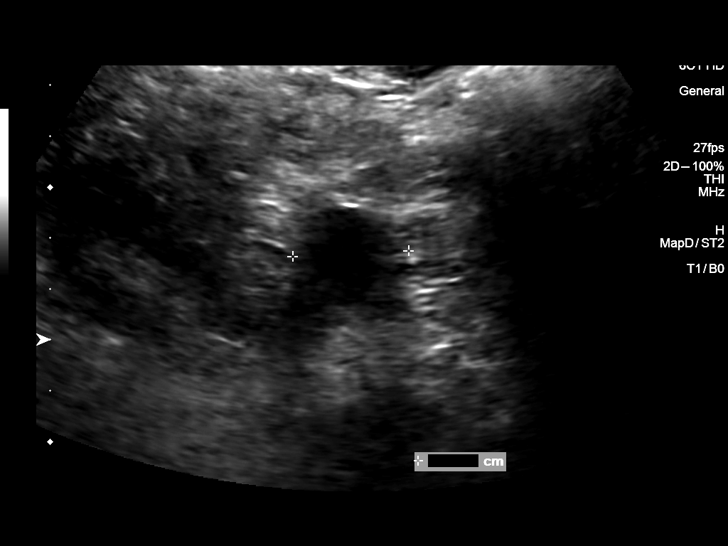
[im 6/12]
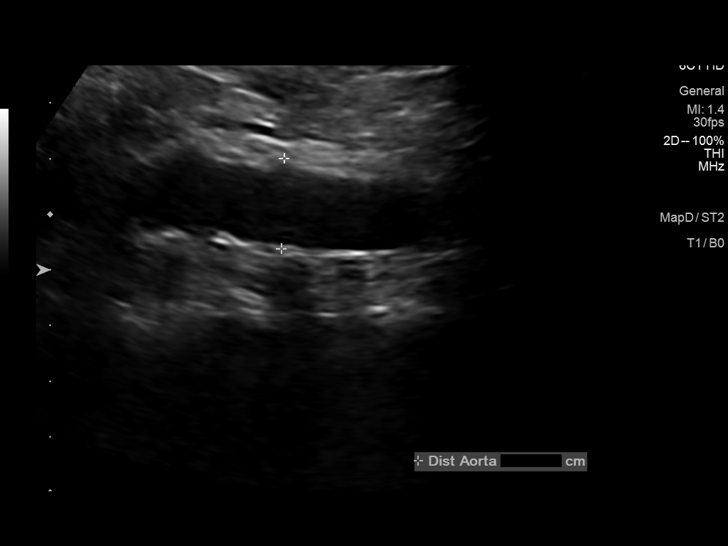
[im 7/12]
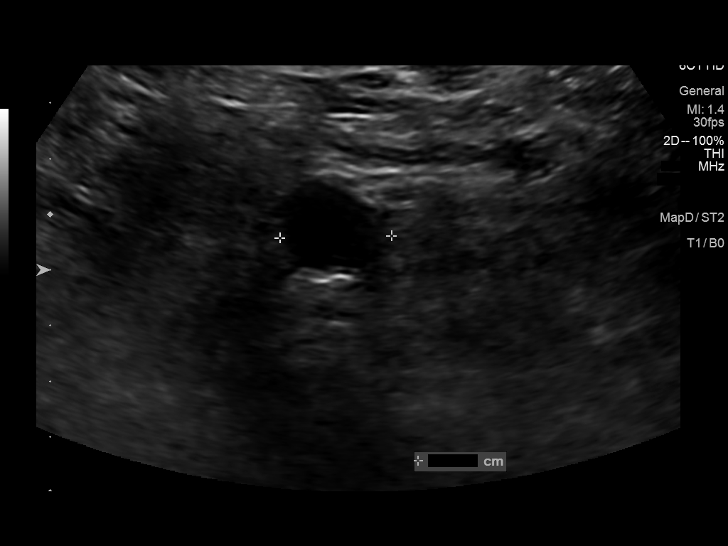
[im 8/12]
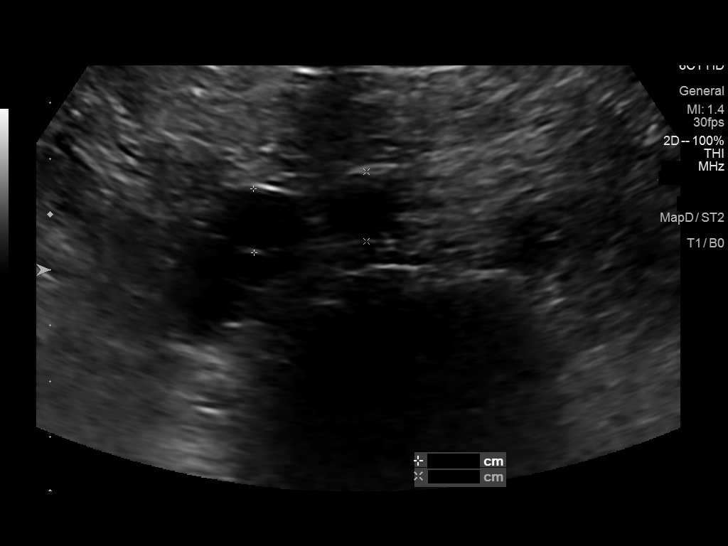
[im 9/12]
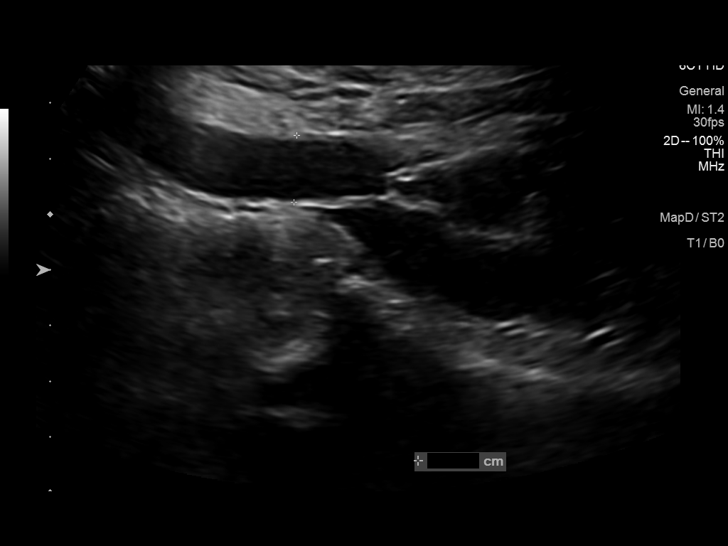
[im 10/12]
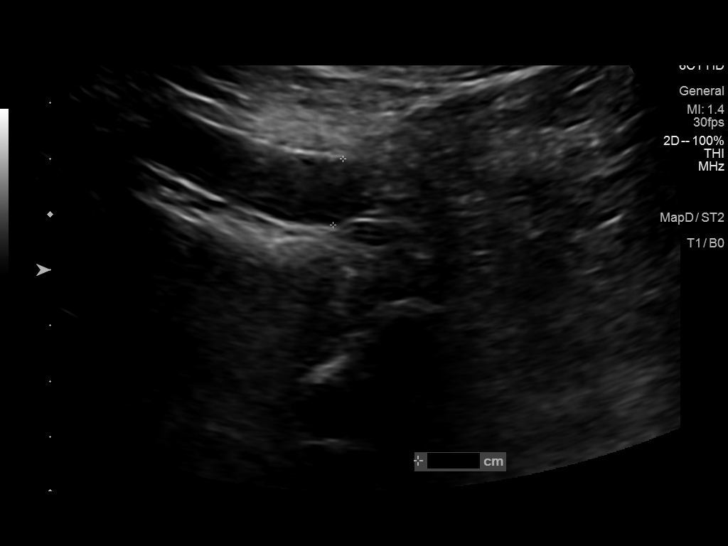
[im 11/12]
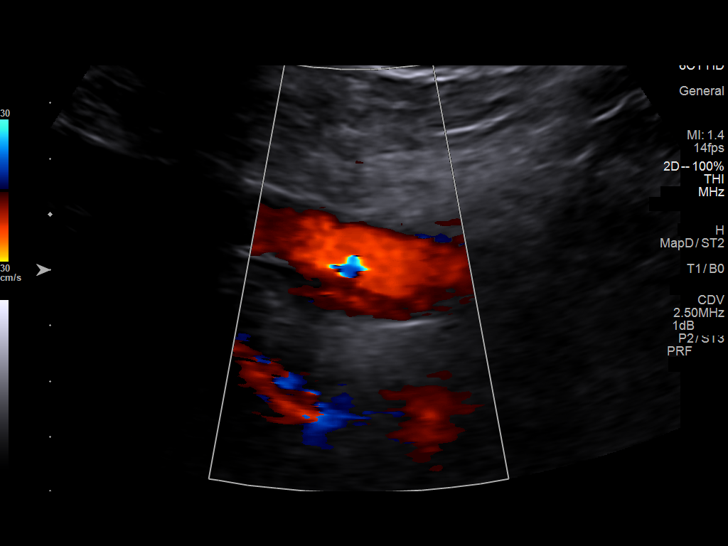
[im 12/12]
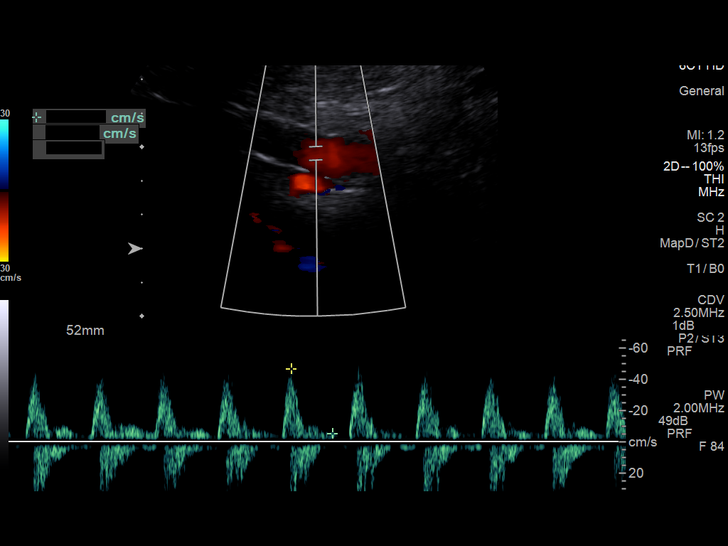

[12 of 12 positions shown; findings below may reference images not displayed]

FINDINGS: Abdominal aortic measurements as follows:

Proximal:  2.5 x 2.3 cm

Mid:  2.0 x 2.3 cm

Distal:  1.6 x 2.0 cm
Patent: Yes, peak systolic velocity is 46.8 cm/s

Right common iliac artery: 1.2 x 1.2 cm

Left common iliac artery: 1.2 x 1.3 cm
IMPRESSION: Normal caliber abdominal aorta and common iliac arteries.

## 2020-07-06 DIAGNOSIS — H2513 Age-related nuclear cataract, bilateral: Secondary | ICD-10-CM | POA: Diagnosis not present

## 2020-07-06 DIAGNOSIS — H5203 Hypermetropia, bilateral: Secondary | ICD-10-CM | POA: Diagnosis not present

## 2020-07-06 DIAGNOSIS — H35371 Puckering of macula, right eye: Secondary | ICD-10-CM | POA: Diagnosis not present

## 2020-07-08 DIAGNOSIS — M419 Scoliosis, unspecified: Secondary | ICD-10-CM | POA: Diagnosis not present

## 2020-07-08 DIAGNOSIS — R293 Abnormal posture: Secondary | ICD-10-CM | POA: Diagnosis not present

## 2020-07-08 DIAGNOSIS — M4120 Other idiopathic scoliosis, site unspecified: Secondary | ICD-10-CM | POA: Diagnosis not present

## 2020-07-08 DIAGNOSIS — R531 Weakness: Secondary | ICD-10-CM | POA: Diagnosis not present

## 2020-07-27 DIAGNOSIS — R351 Nocturia: Secondary | ICD-10-CM | POA: Diagnosis not present

## 2020-07-27 DIAGNOSIS — R3916 Straining to void: Secondary | ICD-10-CM | POA: Diagnosis not present

## 2020-07-27 DIAGNOSIS — R3914 Feeling of incomplete bladder emptying: Secondary | ICD-10-CM | POA: Diagnosis not present

## 2020-07-27 DIAGNOSIS — R3912 Poor urinary stream: Secondary | ICD-10-CM | POA: Diagnosis not present

## 2020-07-27 DIAGNOSIS — N401 Enlarged prostate with lower urinary tract symptoms: Secondary | ICD-10-CM | POA: Diagnosis not present

## 2020-07-31 DIAGNOSIS — R3912 Poor urinary stream: Secondary | ICD-10-CM | POA: Diagnosis not present

## 2020-07-31 DIAGNOSIS — N401 Enlarged prostate with lower urinary tract symptoms: Secondary | ICD-10-CM | POA: Diagnosis not present

## 2020-08-27 DIAGNOSIS — R3916 Straining to void: Secondary | ICD-10-CM | POA: Diagnosis not present

## 2020-11-04 DIAGNOSIS — L812 Freckles: Secondary | ICD-10-CM | POA: Diagnosis not present

## 2020-11-04 DIAGNOSIS — L570 Actinic keratosis: Secondary | ICD-10-CM | POA: Diagnosis not present

## 2020-11-04 DIAGNOSIS — L309 Dermatitis, unspecified: Secondary | ICD-10-CM | POA: Diagnosis not present

## 2020-11-04 DIAGNOSIS — C44219 Basal cell carcinoma of skin of left ear and external auricular canal: Secondary | ICD-10-CM | POA: Diagnosis not present

## 2020-11-04 DIAGNOSIS — L821 Other seborrheic keratosis: Secondary | ICD-10-CM | POA: Diagnosis not present

## 2020-11-04 DIAGNOSIS — D225 Melanocytic nevi of trunk: Secondary | ICD-10-CM | POA: Diagnosis not present

## 2020-11-04 DIAGNOSIS — D485 Neoplasm of uncertain behavior of skin: Secondary | ICD-10-CM | POA: Diagnosis not present

## 2020-11-04 DIAGNOSIS — C44329 Squamous cell carcinoma of skin of other parts of face: Secondary | ICD-10-CM | POA: Diagnosis not present

## 2020-11-04 DIAGNOSIS — C44729 Squamous cell carcinoma of skin of left lower limb, including hip: Secondary | ICD-10-CM | POA: Diagnosis not present

## 2020-11-04 DIAGNOSIS — Z85828 Personal history of other malignant neoplasm of skin: Secondary | ICD-10-CM | POA: Diagnosis not present

## 2020-11-14 ENCOUNTER — Encounter: Payer: Self-pay | Admitting: Cardiovascular Disease

## 2020-11-14 NOTE — Progress Notes (Signed)
Cardiology Office Note   Date:  11/16/2020   ID:  [name], DOB [birth year], MRN [id number]  PCP:  Audley Hose, MD  Cardiologist:   Mertie Moores, MD   Chief Complaint  Patient presents with   Hyperlipidemia   Problems: 1. Hyperlipidemia 2. hypothyroidism    Charles Solis is a 70 y.o. male who presents for hyperlipidemia.  Charles Solis has seen Dr. Adriana Simas over the past years.  He has had his TSH checked by Dr. Sharol Roussel and it is normal .   Was tried on Atorvastatin and had some mental sluggishness fairly shortly after that.  His recent lab work shows total cholesterol 253, triglyceride levels 133, LDL cholesterols 179.  His LDL particle number is 2349   Is otherwise healthy. Does have a family hx of CAD Father had MI and CABG at age 57     Has never had any chest pain . Eats fairly well.    Works out regularly .    04/20/2015: Charles Solis is seen today for follow-up of his hypercholesterolemia. Coronary calcium score:  IMPRESSION: Coronary calcium score of 50. This was 36 percentile for age and sex matched control.  Was started on Crestor - tolerating fairly well,   Did not tolerate Atorvastatin in the past. Had some brain foggyness.  He's cut his dose to Crestor 10 mg 3 times a week approximate 4 months ago. His LDL particle number has decreased slightly. In August his LDL particle number was 1890 After the crestor 3 times a week, it came down to 1261.   Oct. 9 , 2017   Charles Solis is see today for follow Up of his hyperlipidemia. Cardio IQ on June 6 revealed an LDL particle number of 1042.   He has a LDL pattern A which is desirable .  Takes Crestor 10 mg M,W, F, Sat.  No CP or dyspnea.  Staying very active.  Watches his diet .   June 08, 2016:  Charles Solis is seen back for follow up of his CAD  LDL particle number was 1229 in Jan. 2018: Taking Crestor 20 mg a day , Zetia 10 mg a day  Feeling well Has some   Nov. 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   Still exercising  Family hx of CAD but no known CAD in him   October 19, 2017:  Charles Solis is seen today for follow-up of his hyperlipidemia. Is on Rosuvastatin  Has put on some weight .  Trying to eat better   November 16, 2018  No CP , no dyspna.  Does elliptical regulalry  Has been doing PT for back injury .  Still working  [employer] is 187 today   November 19, 2019: Charles Solis is seen today for follow-up of his hyperlipidemia. Wt is 185 lbs.  Working out regularly .    Oct. 3, 2022: Charles Solis is seen today for follow up of his HLD Still very active Wt is 187 lbs Does the elliptical. 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     potassium chloride (KLOR-CON) 10 MEQ tablet TAKE 1 TABLET BY MOUTH DAILY 90 tablet 2   rosuvastatin (CRESTOR) 20 MG tablet TAKE 1 TABLET BY MOUTH ONCE DAILY 90 tablet 3   Saw Palmetto 500 MG CAPS Take 1 capsule 3 (three) times daily by mouth.     alfuzosin (UROXATRAL) 10 MG 24 hr tablet Take 10 mg by mouth daily. (Patient not taking: Reported on 11/16/2020)     No current facility-administered medications for this visit.    Allergies:   Patient has no known allergies.    Social History:  The patient  reports that he has quit smoking. He has never used smokeless tobacco. He reports current alcohol use. He reports that he does not use  drugs.   Family History:  The patient's family history includes Alzheimer's disease in his mother and paternal grandmother; Colon cancer in his father; Coronary artery disease in his maternal grandfather; Heart attack in his father and maternal grandfather; Heart disease in his paternal grandfather; Hyperlipidemia in his sister; Hypertension in his mother; Lung cancer in his paternal grandfather.    ROS:  Please see the history of present illness.    Review of Systems: As noted in HPI,  Otherwise negative   Physical Exam: Blood pressure 122/78, pulse 73, height 6\' 1"  (1.854 m), weight 187 lb 12.8 oz (85.2 kg), SpO2 99 %.  GEN:  Well nourished, well developed in no acute distress HEENT: Normal NECK: No JVD; No carotid bruits LYMPHATICS: No lymphadenopathy CARDIAC: RRR , no murmurs, rubs, gallops RESPIRATORY:  Clear to auscultation without rales, wheezing or rhonchi  ABDOMEN: Soft, non-tender, non-distended MUSCULOSKELETAL:  No edema; No deformity  SKIN: Warm and dry NEUROLOGIC:  Alert and oriented x 3     EKG: November 16, 2020: Normal sinus rhythm at 73.  No ST or T wave changes.   Recent Labs: 11/19/2019: ALT 23; BUN 14; Creatinine, Ser 1.09; Potassium 3.8; Sodium 144    Lipid Panel    Component Value Date/Time   CHOL 112 11/19/2019 1556   CHOL 170 07/21/2015 0916   TRIG 73 11/19/2019 1556   TRIG 61 12/21/2016 0955   HDL 44 11/19/2019 1556   HDL 51 12/21/2016 0955   CHOLHDL 2.5 11/19/2019 1556   CHOLHDL 3.0 07/21/2015 0916   LDLCALC 53 11/19/2019 1556   LDLCALC 95 07/21/2015 0916      Wt Readings from Last 3 Encounters:  11/16/20 187 lb 12.8 oz (85.2 kg)  11/19/19 195 lb 12.8 oz (88.8 kg)  11/16/18 187 lb 1.9 oz (84.9 kg)      Other studies Reviewed: Additional studies/ records that were reviewed today include: . Review of the above records demonstrates:    ASSESSMENT AND PLAN:  1.  Hyperlipidemia: .  Lipid levels from last year look great.  We will  draw lipid panel and ALT today.  Continue current medications.   2.  Mild hypertension:    Blood pressure is well controlled.  Continue current medications.  We will have him follow-up with me or an APP in 1 year.   Current medicines are reviewed at length with the patient today.  The patient does not  have concerns regarding medicines.  The following changes have been made:  no change  Labs/ tests ordered today include:   Orders Placed This Encounter  Procedures   Lipid panel   ALT   EKG 12-Lead     Disposition:        Mertie Moores, MD  11/16/2020 10:27 AM    [facility] [address], [place], [place]  [address] Phone: [phone]; Fax: [phone]

## 2020-11-16 ENCOUNTER — Ambulatory Visit (INDEPENDENT_AMBULATORY_CARE_PROVIDER_SITE_OTHER): Payer: Medicare Other | Admitting: Cardiovascular Disease

## 2020-11-16 ENCOUNTER — Other Ambulatory Visit: Payer: Self-pay

## 2020-11-16 ENCOUNTER — Encounter: Payer: Self-pay | Admitting: Cardiovascular Disease

## 2020-11-16 VITALS — BP 122/78 | HR 73 | Ht 73.0 in | Wt 187.8 lb

## 2020-11-16 DIAGNOSIS — E782 Mixed hyperlipidemia: Secondary | ICD-10-CM

## 2020-11-16 DIAGNOSIS — I10 Essential (primary) hypertension: Secondary | ICD-10-CM | POA: Diagnosis not present

## 2020-11-16 LAB — LIPID PANEL
Chol/HDL Ratio: 2.7 ratio (ref 0.0–5.0)
Cholesterol, Total: 117 mg/dL (ref 100–199)
HDL: 43 mg/dL (ref >39)
LDL Chol Calc (NIH): 61 mg/dL (ref 0–99)
Triglycerides: 56 mg/dL (ref 0–149)
VLDL Cholesterol Cal: 13 mg/dL (ref 5–40)

## 2020-11-16 LAB — ALT: ALT: 28 IU/L (ref 0–44)

## 2020-11-16 NOTE — Patient Instructions (Signed)
Medication Instructions:  Your physician recommends that you continue on your current medications as directed. Please refer to the Current Medication list given to you today.  *If you need a refill on your cardiac medications before your next appointment, please call your pharmacy*   Lab Work: TODAY: FLP, ALT If you have labs (blood work) drawn today and your tests are completely normal, you will receive your results only by: [place] (if you have MyChart) OR A paper copy in the mail If you have any lab test that is abnormal or we need to change your treatment, we will call you to review the results.   Testing/Procedures: NONE   Follow-Up: At [facility], you and your health needs are our priority.  As part of our continuing mission to provide you with exceptional heart care, we have created designated Provider Care Teams.  These Care Teams include your primary Cardiologist (physician) and Advanced Practice Providers (APPs -  Physician Assistants and Nurse Practitioners) who all work together to provide you with the care you need, when you need it.    Your next appointment:   12 month(s)  The format for your next appointment:   In Person  Provider:   You may see Mertie Moores, MD or one of the following Advanced Practice Providers on your designated Care Team:   Richardson Dopp, PA-C [place], [place]

## 2020-11-20 DIAGNOSIS — R972 Elevated prostate specific antigen [PSA]: Secondary | ICD-10-CM | POA: Diagnosis not present

## 2020-11-20 DIAGNOSIS — N401 Enlarged prostate with lower urinary tract symptoms: Secondary | ICD-10-CM | POA: Diagnosis not present

## 2020-11-20 DIAGNOSIS — R351 Nocturia: Secondary | ICD-10-CM | POA: Diagnosis not present

## 2020-12-10 DIAGNOSIS — Z23 Encounter for immunization: Secondary | ICD-10-CM | POA: Diagnosis not present

## 2020-12-15 ENCOUNTER — Other Ambulatory Visit: Payer: Self-pay | Admitting: Cardiovascular Disease

## 2020-12-24 DIAGNOSIS — C44219 Basal cell carcinoma of skin of left ear and external auricular canal: Secondary | ICD-10-CM | POA: Diagnosis not present

## 2020-12-24 DIAGNOSIS — C44329 Squamous cell carcinoma of skin of other parts of face: Secondary | ICD-10-CM | POA: Diagnosis not present

## 2020-12-24 DIAGNOSIS — Z85828 Personal history of other malignant neoplasm of skin: Secondary | ICD-10-CM | POA: Diagnosis not present

## 2020-12-31 DIAGNOSIS — Z4802 Encounter for removal of sutures: Secondary | ICD-10-CM | POA: Diagnosis not present

## 2021-03-08 DIAGNOSIS — L570 Actinic keratosis: Secondary | ICD-10-CM | POA: Diagnosis not present

## 2021-03-08 DIAGNOSIS — Z85828 Personal history of other malignant neoplasm of skin: Secondary | ICD-10-CM | POA: Diagnosis not present

## 2021-03-08 DIAGNOSIS — L812 Freckles: Secondary | ICD-10-CM | POA: Diagnosis not present

## 2021-04-13 DIAGNOSIS — Z136 Encounter for screening for cardiovascular disorders: Secondary | ICD-10-CM | POA: Diagnosis not present

## 2021-04-13 DIAGNOSIS — E039 Hypothyroidism, unspecified: Secondary | ICD-10-CM | POA: Diagnosis not present

## 2021-04-13 DIAGNOSIS — Z125 Encounter for screening for malignant neoplasm of prostate: Secondary | ICD-10-CM | POA: Diagnosis not present

## 2021-04-13 DIAGNOSIS — E559 Vitamin D deficiency, unspecified: Secondary | ICD-10-CM | POA: Diagnosis not present

## 2021-04-13 DIAGNOSIS — Z0000 Encounter for general adult medical examination without abnormal findings: Secondary | ICD-10-CM | POA: Diagnosis not present

## 2021-04-15 DIAGNOSIS — Z125 Encounter for screening for malignant neoplasm of prostate: Secondary | ICD-10-CM | POA: Diagnosis not present

## 2021-04-15 DIAGNOSIS — J310 Chronic rhinitis: Secondary | ICD-10-CM | POA: Diagnosis not present

## 2021-04-15 DIAGNOSIS — Z136 Encounter for screening for cardiovascular disorders: Secondary | ICD-10-CM | POA: Diagnosis not present

## 2021-04-15 DIAGNOSIS — E039 Hypothyroidism, unspecified: Secondary | ICD-10-CM | POA: Diagnosis not present

## 2021-04-15 DIAGNOSIS — R3121 Asymptomatic microscopic hematuria: Secondary | ICD-10-CM | POA: Diagnosis not present

## 2021-04-15 DIAGNOSIS — Z1159 Encounter for screening for other viral diseases: Secondary | ICD-10-CM | POA: Diagnosis not present

## 2021-04-15 DIAGNOSIS — Z1211 Encounter for screening for malignant neoplasm of colon: Secondary | ICD-10-CM | POA: Diagnosis not present

## 2021-04-15 DIAGNOSIS — Z23 Encounter for immunization: Secondary | ICD-10-CM | POA: Diagnosis not present

## 2021-04-15 DIAGNOSIS — I10 Essential (primary) hypertension: Secondary | ICD-10-CM | POA: Diagnosis not present

## 2021-04-15 DIAGNOSIS — Z0001 Encounter for general adult medical examination with abnormal findings: Secondary | ICD-10-CM | POA: Diagnosis not present

## 2021-04-15 DIAGNOSIS — E559 Vitamin D deficiency, unspecified: Secondary | ICD-10-CM | POA: Diagnosis not present

## 2021-04-15 DIAGNOSIS — Z8546 Personal history of malignant neoplasm of prostate: Secondary | ICD-10-CM | POA: Diagnosis not present

## 2021-04-26 DIAGNOSIS — H838X3 Other specified diseases of inner ear, bilateral: Secondary | ICD-10-CM | POA: Diagnosis not present

## 2021-04-26 DIAGNOSIS — H903 Sensorineural hearing loss, bilateral: Secondary | ICD-10-CM | POA: Diagnosis not present

## 2021-05-28 DIAGNOSIS — R972 Elevated prostate specific antigen [PSA]: Secondary | ICD-10-CM | POA: Diagnosis not present

## 2021-06-04 DIAGNOSIS — R3912 Poor urinary stream: Secondary | ICD-10-CM | POA: Diagnosis not present

## 2021-06-04 DIAGNOSIS — R351 Nocturia: Secondary | ICD-10-CM | POA: Diagnosis not present

## 2021-06-04 DIAGNOSIS — C61 Malignant neoplasm of prostate: Secondary | ICD-10-CM | POA: Diagnosis not present

## 2021-06-04 DIAGNOSIS — R350 Frequency of micturition: Secondary | ICD-10-CM | POA: Diagnosis not present

## 2021-06-04 DIAGNOSIS — N401 Enlarged prostate with lower urinary tract symptoms: Secondary | ICD-10-CM | POA: Diagnosis not present

## 2021-07-01 ENCOUNTER — Other Ambulatory Visit: Payer: Self-pay | Admitting: Cardiovascular Disease

## 2021-08-10 DIAGNOSIS — R509 Fever, unspecified: Secondary | ICD-10-CM | POA: Diagnosis not present

## 2021-08-10 DIAGNOSIS — A4189 Other specified sepsis: Secondary | ICD-10-CM | POA: Diagnosis not present

## 2021-08-10 DIAGNOSIS — C61 Malignant neoplasm of prostate: Secondary | ICD-10-CM | POA: Diagnosis not present

## 2021-08-10 DIAGNOSIS — J1282 Pneumonia due to coronavirus disease 2019: Secondary | ICD-10-CM | POA: Diagnosis not present

## 2021-08-10 DIAGNOSIS — R079 Chest pain, unspecified: Secondary | ICD-10-CM | POA: Diagnosis not present

## 2021-08-10 DIAGNOSIS — Z743 Need for continuous supervision: Secondary | ICD-10-CM | POA: Diagnosis not present

## 2021-08-10 DIAGNOSIS — E785 Hyperlipidemia, unspecified: Secondary | ICD-10-CM | POA: Diagnosis not present

## 2021-08-10 DIAGNOSIS — U071 COVID-19: Secondary | ICD-10-CM | POA: Diagnosis not present

## 2021-08-10 DIAGNOSIS — D72810 Lymphocytopenia: Secondary | ICD-10-CM | POA: Diagnosis not present

## 2021-08-10 DIAGNOSIS — J9601 Acute respiratory failure with hypoxia: Secondary | ICD-10-CM | POA: Diagnosis not present

## 2021-08-10 DIAGNOSIS — A419 Sepsis, unspecified organism: Secondary | ICD-10-CM | POA: Diagnosis not present

## 2021-08-10 DIAGNOSIS — J029 Acute pharyngitis, unspecified: Secondary | ICD-10-CM | POA: Diagnosis not present

## 2021-09-06 DIAGNOSIS — D485 Neoplasm of uncertain behavior of skin: Secondary | ICD-10-CM | POA: Diagnosis not present

## 2021-09-06 DIAGNOSIS — C44619 Basal cell carcinoma of skin of left upper limb, including shoulder: Secondary | ICD-10-CM | POA: Diagnosis not present

## 2021-09-06 DIAGNOSIS — D225 Melanocytic nevi of trunk: Secondary | ICD-10-CM | POA: Diagnosis not present

## 2021-09-06 DIAGNOSIS — D2272 Melanocytic nevi of left lower limb, including hip: Secondary | ICD-10-CM | POA: Diagnosis not present

## 2021-09-06 DIAGNOSIS — D2271 Melanocytic nevi of right lower limb, including hip: Secondary | ICD-10-CM | POA: Diagnosis not present

## 2021-09-06 DIAGNOSIS — D3617 Benign neoplasm of peripheral nerves and autonomic nervous system of trunk, unspecified: Secondary | ICD-10-CM | POA: Diagnosis not present

## 2021-09-06 DIAGNOSIS — Z85828 Personal history of other malignant neoplasm of skin: Secondary | ICD-10-CM | POA: Diagnosis not present

## 2021-09-06 DIAGNOSIS — L570 Actinic keratosis: Secondary | ICD-10-CM | POA: Diagnosis not present

## 2021-09-06 DIAGNOSIS — L821 Other seborrheic keratosis: Secondary | ICD-10-CM | POA: Diagnosis not present

## 2021-09-06 DIAGNOSIS — C44319 Basal cell carcinoma of skin of other parts of face: Secondary | ICD-10-CM | POA: Diagnosis not present

## 2021-11-09 DIAGNOSIS — Z85828 Personal history of other malignant neoplasm of skin: Secondary | ICD-10-CM | POA: Diagnosis not present

## 2021-11-09 DIAGNOSIS — C44319 Basal cell carcinoma of skin of other parts of face: Secondary | ICD-10-CM | POA: Diagnosis not present

## 2021-11-16 DIAGNOSIS — D485 Neoplasm of uncertain behavior of skin: Secondary | ICD-10-CM | POA: Diagnosis not present

## 2021-11-16 DIAGNOSIS — C4441 Basal cell carcinoma of skin of scalp and neck: Secondary | ICD-10-CM | POA: Diagnosis not present

## 2021-11-16 DIAGNOSIS — L570 Actinic keratosis: Secondary | ICD-10-CM | POA: Diagnosis not present

## 2021-11-20 DIAGNOSIS — Z23 Encounter for immunization: Secondary | ICD-10-CM | POA: Diagnosis not present

## 2021-11-22 ENCOUNTER — Encounter: Payer: Self-pay | Admitting: Cardiovascular Disease

## 2021-11-22 NOTE — Progress Notes (Unsigned)
Cardiology Office Note   Date:  11/22/2021   ID:  Charles Solis, DOB Jul 21, 1950, MRN [id number]  PCP:  Audley Hose, MD  Cardiologist:   Mertie Moores, MD   Chief Complaint  Patient presents with   Hyperlipidemia   Problems: 1. Hyperlipidemia 2. hypothyroidism    Charles Solis is a 71 y.o. male who presents for hyperlipidemia.  Charles Solis has seen Dr. Adriana Simas over the past years.  He has had his TSH checked by Dr. Sharol Roussel and it is normal .   Was tried on Atorvastatin and had some mental sluggishness fairly shortly after that.  His recent lab work shows total cholesterol 253, triglyceride levels 133, LDL cholesterols 179.  His LDL particle number is 2349   Is otherwise healthy. Does have a family hx of CAD Father had MI and CABG at age 58     Has never had any chest pain . Eats fairly well.    Works out regularly .    04/20/2015: Charles Solis is seen today for follow-up of his hypercholesterolemia. Coronary calcium score:  IMPRESSION: Coronary calcium score of 50. This was 37 percentile for age and sex matched control.  Was started on Crestor - tolerating fairly well,   Did not tolerate Atorvastatin in the past. Had some brain foggyness.  He's cut his dose to Crestor 10 mg 3 times a week approximate 4 months ago. His LDL particle number has decreased slightly. In August his LDL particle number was 1890 After the crestor 3 times a week, it came down to 1261.   Oct. 9 , 2017   Charles Solis is see today for follow Up of his hyperlipidemia. Cardio IQ on June 6 revealed an LDL particle number of 1042.   He has a LDL pattern A which is desirable .  Takes Crestor 10 mg M,W, F, Sat.  No CP or dyspnea.  Staying very active.  Watches his diet .   June 08, 2016:  Charles Solis is seen back for follow up of his CAD  LDL particle number was 1229 in Jan. 2018: Taking Crestor 20 mg a day , Zetia 10 mg a day  Feeling well Has some   Nov. 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   Still exercising  Family hx of CAD but no known CAD in him   October 19, 2017:  Charles Solis is seen today for follow-up of his hyperlipidemia. Is on Rosuvastatin  Has put on some weight .  Trying to eat better   November 16, 2018  No CP , no dyspna.  Does elliptical regulalry  Has been doing PT for back injury .  Still working  [employer] is 187 today   November 19, 2019: Charles Solis is seen today for follow-up of his hyperlipidemia. Wt is 185 lbs.  Working out regularly .    Oct. 3, 2022: Charles Solis is seen today for follow up of his HLD Still very active Wt is 187 lbs Does the elliptical. 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 10, 2023 Charles Solis is seen for follow up of hi sHLT, HTN    Past Medical History:  Diagnosis Date   Hypothyroidism    Prostate cancer [facility])     Past Surgical History:  Procedure Laterality Date   TONSILLECTOMY  Current Outpatient Medications  Medication Sig Dispense Refill   alfuzosin (UROXATRAL) 10 MG 24 hr tablet Take 10 mg by mouth daily. (Patient not taking: Reported on 11/16/2020)     ezetimibe (ZETIA) 10 MG tablet TAKE ONE TABLET BY MOUTH EVERY DAY 90 tablet 3   hydrochlorothiazide (MICROZIDE) 12.5 MG capsule TAKE 1 CAPSULE BY MOUTH ONCE DAILY 90 capsule 1   ipratropium (ATROVENT) 0.03 % nasal spray Place 1 spray into both nostrils daily as needed.     potassium chloride (KLOR-CON) 10 MEQ tablet TAKE 1 TABLET BY MOUTH DAILY 90 tablet 1   rosuvastatin (CRESTOR) 20 MG tablet TAKE 1 TABLET BY MOUTH ONCE DAILY 90 tablet 3   Saw Palmetto 500 MG CAPS Take 1 capsule 3 (three) times daily by mouth.     No current facility-administered medications for this visit.    Allergies:   Patient has no known allergies.    Social History:  The patient  reports that he has quit smoking. He has never used smokeless tobacco. He  reports current alcohol use. He reports that he does not use drugs.   Family History:  The patient's family history includes Alzheimer's disease in his mother and paternal grandmother; Colon cancer in his father; Coronary artery disease in his maternal grandfather; Heart attack in his father and maternal grandfather; Heart disease in his paternal grandfather; Hyperlipidemia in his sister; Hypertension in his mother; Lung cancer in his paternal grandfather.    ROS:  Please see the history of present illness.    Review of Systems: As noted in HPI,  Otherwise negative   Physical Exam: There were no vitals taken for this visit.  No BP recorded.  {Refresh Note OR Click here to enter BP  :1}***    GEN:  Well nourished, well developed in no acute distress HEENT: Normal NECK: No JVD; No carotid bruits LYMPHATICS: No lymphadenopathy CARDIAC: RRR ***, no murmurs, rubs, gallops RESPIRATORY:  Clear to auscultation without rales, wheezing or rhonchi  ABDOMEN: Soft, non-tender, non-distended MUSCULOSKELETAL:  No edema; No deformity  SKIN: Warm and dry NEUROLOGIC:  Alert and oriented x 3     EKG:     Recent Labs: No results found for requested labs within last 365 days.    Lipid Panel    Component Value Date/Time   CHOL 117 11/16/2020 1030   CHOL 170 07/21/2015 0916   TRIG 56 11/16/2020 1030   TRIG 61 12/21/2016 0955   HDL 43 11/16/2020 1030   HDL 51 12/21/2016 0955   CHOLHDL 2.7 11/16/2020 1030   CHOLHDL 3.0 07/21/2015 0916   LDLCALC 61 11/16/2020 1030   LDLCALC 95 07/21/2015 0916      Wt Readings from Last 3 Encounters:  11/16/20 187 lb 12.8 oz (85.2 kg)  11/19/19 195 lb 12.8 oz (88.8 kg)  11/16/18 187 lb 1.9 oz (84.9 kg)      Other studies Reviewed: Additional studies/ records that were reviewed today include: . Review of the above records demonstrates:    ASSESSMENT AND PLAN:  1.  Hyperlipidemia: .    2.  Mild hypertension:      We will have him  follow-up with me or an APP in 1 year.   Current medicines are reviewed at length with the patient today.  The patient does not have concerns regarding medicines.  The following changes have been made:  no change  Labs/ tests ordered today include:   No orders of the defined types were placed in  this encounter.    Disposition:        Mertie Moores, MD  11/22/2021 8:58 PM    [facility] [address], [place], [place]  [address] Phone: [phone]; Fax: [phone]

## 2021-11-23 ENCOUNTER — Encounter: Payer: Self-pay | Admitting: Cardiovascular Disease

## 2021-11-23 ENCOUNTER — Ambulatory Visit: Payer: Medicare Other | Attending: Cardiovascular Disease | Admitting: Cardiovascular Disease

## 2021-11-23 VITALS — BP 118/78 | HR 84 | Ht 73.0 in | Wt 183.4 lb

## 2021-11-23 DIAGNOSIS — I10 Essential (primary) hypertension: Secondary | ICD-10-CM | POA: Insufficient documentation

## 2021-11-23 DIAGNOSIS — E782 Mixed hyperlipidemia: Secondary | ICD-10-CM | POA: Diagnosis not present

## 2021-11-23 NOTE — Patient Instructions (Signed)
Medication Instructions:  NONE *If you need a refill on your cardiac medications before your next appointment, please call your pharmacy*   Lab Work: NONE If you have labs (blood work) drawn today and your tests are completely normal, you will receive your results only by: [place] (if you have MyChart) OR A paper copy in the mail If you have any lab test that is abnormal or we need to change your treatment, we will call you to review the results.   Testing/Procedures: NONE   Follow-Up: At [facility], you and your health needs are our priority.  As part of our continuing mission to provide you with exceptional heart care, we have created designated Provider Care Teams.  These Care Teams include your primary Cardiologist (physician) and Advanced Practice Providers (APPs -  Physician Assistants and Nurse Practitioners) who all work together to provide you with the care you need, when you need it.  We recommend signing up for the patient portal called "MyChart".  Sign up information is provided on this After Visit Summary.  MyChart is used to connect with patients for Virtual Visits (Telemedicine).  Patients are able to view lab/test results, encounter notes, upcoming appointments, etc.  Non-urgent messages can be sent to your provider as well.   To learn more about what you can do with MyChart, go to [email or web address].    Your next appointment:   1 year(s)  The format for your next appointment:   In Person  Provider:   Mertie Moores, MD     Other Instructions NONE  Important Information About Sugar

## 2021-12-22 DIAGNOSIS — C44212 Basal cell carcinoma of skin of right ear and external auricular canal: Secondary | ICD-10-CM | POA: Diagnosis not present

## 2021-12-22 DIAGNOSIS — Z85828 Personal history of other malignant neoplasm of skin: Secondary | ICD-10-CM | POA: Diagnosis not present

## 2021-12-29 DIAGNOSIS — L570 Actinic keratosis: Secondary | ICD-10-CM | POA: Diagnosis not present

## 2022-01-07 ENCOUNTER — Other Ambulatory Visit: Payer: Self-pay | Admitting: Cardiovascular Disease

## 2022-01-18 DIAGNOSIS — C61 Malignant neoplasm of prostate: Secondary | ICD-10-CM | POA: Diagnosis not present

## 2022-01-24 DIAGNOSIS — C61 Malignant neoplasm of prostate: Secondary | ICD-10-CM | POA: Diagnosis not present

## 2022-01-24 DIAGNOSIS — R350 Frequency of micturition: Secondary | ICD-10-CM | POA: Diagnosis not present

## 2022-02-25 DIAGNOSIS — K649 Unspecified hemorrhoids: Secondary | ICD-10-CM | POA: Diagnosis not present

## 2022-02-25 DIAGNOSIS — Z800 Family history of malignant neoplasm of digestive organs: Secondary | ICD-10-CM | POA: Diagnosis not present

## 2022-02-25 DIAGNOSIS — K5730 Diverticulosis of large intestine without perforation or abscess without bleeding: Secondary | ICD-10-CM | POA: Diagnosis not present

## 2022-03-09 DIAGNOSIS — D225 Melanocytic nevi of trunk: Secondary | ICD-10-CM | POA: Diagnosis not present

## 2022-03-09 DIAGNOSIS — L570 Actinic keratosis: Secondary | ICD-10-CM | POA: Diagnosis not present

## 2022-03-09 DIAGNOSIS — D1801 Hemangioma of skin and subcutaneous tissue: Secondary | ICD-10-CM | POA: Diagnosis not present

## 2022-03-09 DIAGNOSIS — M7130 Other bursal cyst, unspecified site: Secondary | ICD-10-CM | POA: Diagnosis not present

## 2022-03-09 DIAGNOSIS — D2272 Melanocytic nevi of left lower limb, including hip: Secondary | ICD-10-CM | POA: Diagnosis not present

## 2022-03-09 DIAGNOSIS — L814 Other melanin hyperpigmentation: Secondary | ICD-10-CM | POA: Diagnosis not present

## 2022-03-09 DIAGNOSIS — L821 Other seborrheic keratosis: Secondary | ICD-10-CM | POA: Diagnosis not present

## 2022-03-09 DIAGNOSIS — D2261 Melanocytic nevi of right upper limb, including shoulder: Secondary | ICD-10-CM | POA: Diagnosis not present

## 2022-03-09 DIAGNOSIS — D2262 Melanocytic nevi of left upper limb, including shoulder: Secondary | ICD-10-CM | POA: Diagnosis not present

## 2022-03-09 DIAGNOSIS — D2271 Melanocytic nevi of right lower limb, including hip: Secondary | ICD-10-CM | POA: Diagnosis not present

## 2022-03-09 DIAGNOSIS — Z85828 Personal history of other malignant neoplasm of skin: Secondary | ICD-10-CM | POA: Diagnosis not present

## 2022-05-02 DIAGNOSIS — H838X3 Other specified diseases of inner ear, bilateral: Secondary | ICD-10-CM | POA: Diagnosis not present

## 2022-05-02 DIAGNOSIS — H903 Sensorineural hearing loss, bilateral: Secondary | ICD-10-CM | POA: Diagnosis not present

## 2022-06-06 ENCOUNTER — Other Ambulatory Visit: Payer: Self-pay | Admitting: Urology

## 2022-06-06 DIAGNOSIS — C61 Malignant neoplasm of prostate: Secondary | ICD-10-CM

## 2022-07-18 DIAGNOSIS — C61 Malignant neoplasm of prostate: Secondary | ICD-10-CM | POA: Diagnosis not present

## 2022-07-19 ENCOUNTER — Ambulatory Visit
Admission: RE | Admit: 2022-07-19 | Discharge: 2022-07-19 | Disposition: A | Discharge status: Home / Self Care | Payer: Medicare Other | Source: Ambulatory Visit | Attending: Urology | Admitting: Urology

## 2022-07-19 DIAGNOSIS — C61 Malignant neoplasm of prostate: Secondary | ICD-10-CM

## 2022-07-19 DIAGNOSIS — R972 Elevated prostate specific antigen [PSA]: Secondary | ICD-10-CM | POA: Diagnosis not present

## 2022-07-19 MED ORDER — GADOPICLENOL 0.5 MMOL/ML IV SOLN
9.0000 mL | Freq: Once | INTRAVENOUS | Status: AC | PRN
  Administered 2022-07-19: 9 mL via INTRAVENOUS

## 2022-07-25 DIAGNOSIS — R350 Frequency of micturition: Secondary | ICD-10-CM | POA: Diagnosis not present

## 2022-07-25 DIAGNOSIS — C61 Malignant neoplasm of prostate: Secondary | ICD-10-CM | POA: Diagnosis not present

## 2022-08-09 ENCOUNTER — Telehealth: Payer: Self-pay | Admitting: Cardiovascular Disease

## 2022-08-09 NOTE — Telephone Encounter (Signed)
Begin noticing for the last several months felt SOB with exertion that seem to be increasing in frequency with most activities. Questioned patient about his BP, doesn't check routinely, so cannot confirm that fatigue and low energy isn't due to this. Also has hx of hypothyroidism, but doesn't feel that's contributing. Was due for yearly f/u w/NA in September, but requested to move that up. Scheduled with Swinyer, NP on 08/24/22. He will call if symptoms worsen in the interim.

## 2022-08-09 NOTE — Telephone Encounter (Signed)
Pt c/o Shortness Of Breath: STAT if SOB developed within the last 24 hours or pt is noticeably SOB on the phone  1. Are you currently SOB (can you hear that pt is SOB on the phone)? Not right now but he felt SOB yesterday afternoon last  2. How long have you been experiencing SOB? Couple of month   3. Are you SOB when sitting or when up moving around? Moving around  4. Are you currently experiencing any other symptoms? Fatigue, nap during the day 2 or 3 times and low energy.

## 2022-08-22 DIAGNOSIS — C44619 Basal cell carcinoma of skin of left upper limb, including shoulder: Secondary | ICD-10-CM | POA: Diagnosis not present

## 2022-08-22 DIAGNOSIS — D485 Neoplasm of uncertain behavior of skin: Secondary | ICD-10-CM | POA: Diagnosis not present

## 2022-08-22 DIAGNOSIS — D3617 Benign neoplasm of peripheral nerves and autonomic nervous system of trunk, unspecified: Secondary | ICD-10-CM | POA: Diagnosis not present

## 2022-08-22 DIAGNOSIS — D225 Melanocytic nevi of trunk: Secondary | ICD-10-CM | POA: Diagnosis not present

## 2022-08-22 DIAGNOSIS — M7130 Other bursal cyst, unspecified site: Secondary | ICD-10-CM | POA: Diagnosis not present

## 2022-08-22 DIAGNOSIS — D1801 Hemangioma of skin and subcutaneous tissue: Secondary | ICD-10-CM | POA: Diagnosis not present

## 2022-08-22 DIAGNOSIS — D2262 Melanocytic nevi of left upper limb, including shoulder: Secondary | ICD-10-CM | POA: Diagnosis not present

## 2022-08-22 DIAGNOSIS — L570 Actinic keratosis: Secondary | ICD-10-CM | POA: Diagnosis not present

## 2022-08-22 DIAGNOSIS — L603 Nail dystrophy: Secondary | ICD-10-CM | POA: Diagnosis not present

## 2022-08-22 DIAGNOSIS — L812 Freckles: Secondary | ICD-10-CM | POA: Diagnosis not present

## 2022-08-22 DIAGNOSIS — Z85828 Personal history of other malignant neoplasm of skin: Secondary | ICD-10-CM | POA: Diagnosis not present

## 2022-08-22 DIAGNOSIS — L821 Other seborrheic keratosis: Secondary | ICD-10-CM | POA: Diagnosis not present

## 2022-08-22 NOTE — Progress Notes (Unsigned)
  Cardiology Office Note:  .   Date:  08/22/2022  ID:  Charles Solis, DOB Mar 08, 1950, MRN [id number] PCP: [place], MD  [facility] Providers Cardiologist:  Kristeen Miss, MD { Click to update primary MD,subspecialty MD or APP then REFRESH:1}   Patient Profile: .      PMH Hyperlipidemia LDL particle number of 1229 in 2018 >> improved to 834 10 months later Apolipoprotein B =  71 on 12/21/2016 Hypertension Family history CAD  Father had MI and CABG age 53 CT calcium score 09/29/2014 of 50 (48th percentile) Hypothyroidism  Referred to cardiology for management of hyperlipidemia. Family history of CAD.  He has maintained consistent follow-up with no major cardiac conditions.  Last cardiology clinic visit was 11/23/2021 with Dr. Elease Hashimoto at which time LDL cholesterol was 50.  Blood pressure was well-controlled and he had no specific cardiac symptoms.  He was advised to return in 1 year for follow-up.       History of Present Illness: .   Charles Solis is a *** 72 y.o. male who presents for evaluation of shortness of breath.   ROS: ***       Studies Reviewed: .        *** Risk Assessment/Calculations:   {Does this patient have ATRIAL FIBRILLATION?:[phone]} No BP recorded.  {Refresh Note OR Click here to enter BP  :1}***       Physical Exam:   VS:  There were no vitals taken for this visit.   Wt Readings from Last 3 Encounters:  11/23/21 183 lb 6.4 oz (83.2 kg)  11/16/20 187 lb 12.8 oz (85.2 kg)  11/19/19 195 lb 12.8 oz (88.8 kg)    GEN: Well nourished, well developed in no acute distress NECK: No JVD; No carotid bruits CARDIAC: ***RRR, no murmurs, rubs, gallops RESPIRATORY:  Clear to auscultation without rales, wheezing or rhonchi  ABDOMEN: Soft, non-tender, non-distended EXTREMITIES:  No edema; No deformity     ASSESSMENT AND PLAN: .    Shortness of breath: Hypertension: Hyperlipidemia LDL goal < 70:     {Are you ordering a CV Procedure (e.g. stress  test, cath, DCCV, TEE, etc)?   Press F2        :[id number]}  Dispo: ***  Signed, Eligha Bridegroom, NP-C

## 2022-08-24 ENCOUNTER — Encounter: Payer: Self-pay | Admitting: Nurse Practitioner

## 2022-08-24 ENCOUNTER — Ambulatory Visit (INDEPENDENT_AMBULATORY_CARE_PROVIDER_SITE_OTHER): Payer: Medicare Other

## 2022-08-24 ENCOUNTER — Ambulatory Visit: Payer: Medicare Other | Attending: Nurse Practitioner | Admitting: Nurse Practitioner

## 2022-08-24 VITALS — BP 120/76 | HR 122 | Ht 73.0 in | Wt 185.6 lb

## 2022-08-24 DIAGNOSIS — R000 Tachycardia, unspecified: Secondary | ICD-10-CM | POA: Diagnosis not present

## 2022-08-24 DIAGNOSIS — I10 Essential (primary) hypertension: Secondary | ICD-10-CM | POA: Insufficient documentation

## 2022-08-24 DIAGNOSIS — E785 Hyperlipidemia, unspecified: Secondary | ICD-10-CM | POA: Insufficient documentation

## 2022-08-24 DIAGNOSIS — R5383 Other fatigue: Secondary | ICD-10-CM | POA: Insufficient documentation

## 2022-08-24 DIAGNOSIS — R0602 Shortness of breath: Secondary | ICD-10-CM | POA: Insufficient documentation

## 2022-08-24 DIAGNOSIS — R931 Abnormal findings on diagnostic imaging of heart and coronary circulation: Secondary | ICD-10-CM | POA: Insufficient documentation

## 2022-08-24 DIAGNOSIS — M170 Bilateral primary osteoarthritis of knee: Secondary | ICD-10-CM | POA: Diagnosis not present

## 2022-08-24 LAB — CBC
Hematocrit: 46.1 % (ref 37.5–51.0)
Hemoglobin: 16 g/dL (ref 13.0–17.7)
MCH: 30.2 pg (ref 26.6–33.0)
MCHC: 34.7 g/dL (ref 31.5–35.7)
MCV: 87 fL (ref 79–97)
Platelets: 214 10*3/uL (ref 150–450)
RBC: 5.3 x10E6/uL (ref 4.14–5.80)
RDW: 14.1 % (ref 11.6–15.4)
WBC: 7.5 10*3/uL (ref 3.4–10.8)

## 2022-08-24 LAB — D-DIMER, QUANTITATIVE: D-DIMER: 0.35 mg/L FEU (ref 0.00–0.49)

## 2022-08-24 NOTE — Progress Notes (Unsigned)
Applied a 3 day Zio XT monitor to patient in the office  Nahser to read

## 2022-08-24 NOTE — Patient Instructions (Signed)
Medication Instructions:  Your physician recommends that you continue on your current medications as directed. Please refer to the Current Medication list given to you today.  *If you need a refill on your cardiac medications before your next appointment, please call your pharmacy*  Lab Work: TSH Rfx on ABN to FreeT4, CBC, CMET, D-Dimer-TODAY If you have labs (blood work) drawn today and your tests are completely normal, you will receive your results only by: MyChart Message (if you have MyChart) OR A paper copy in the mail If you have any lab test that is abnormal or we need to change your treatment, we will call you to review the results.  Testing/Procedures: Your physician has requested that you have an echocardiogram. Echocardiography is a painless test that uses sound waves to create images of your heart. It provides your doctor with information about the size and shape of your heart and how well your heart's chambers and valves are working. This procedure takes approximately one hour. There are no restrictions for this procedure. Please do NOT wear cologne, perfume, aftershave, or lotions (deodorant is allowed). Please arrive 15 minutes prior to your appointment time.   ZIO XT- Long Term Monitor Instructions  Your physician has requested you wear a ZIO patch monitor for 3 days.  This is a single patch monitor. [employer] supplies one patch monitor per enrollment. Additional stickers are not available. Please do not apply patch if you will be having a Nuclear Stress Test,  Echocardiogram, Cardiac CT, MRI, or Chest Xray during the period you would be wearing the  monitor. The patch cannot be worn during these tests. You cannot remove and re-apply the  ZIO XT patch monitor.  Your ZIO patch monitor will be mailed 3 day USPS to your address on file. It may take 3-5 days  to receive your monitor after you have been enrolled.  Once you have received your monitor, please review the enclosed  instructions. Your monitor  has already been registered assigning a specific monitor serial # to you.  Billing and Patient Assistance Program Information  We have supplied [employer] with any of your insurance information on file for billing purposes. [employer] offers a sliding scale Patient Assistance Program for patients that do not have  insurance, or whose insurance does not completely cover the cost of the ZIO monitor.  You must apply for the Patient Assistance Program to qualify for this discounted rate.  To apply, please call [employer] at [phone], select option 4, select option 2, ask to apply for  Patient Assistance Program. Meredeth Ide will ask your household income, and how many people  are in your household. They will quote your out-of-pocket cost based on that information.  [employer] will also be able to set up a 20-month, interest-free payment plan if needed.  Applying the monitor   Shave hair from upper left chest.  Hold abrader disc by orange tab. Rub abrader in 40 strokes over the upper left chest as  indicated in your monitor instructions.  Clean area with 4 enclosed alcohol pads. Let dry.  Apply patch as indicated in monitor instructions. Patch will be placed under collarbone on left  side of chest with arrow pointing upward.  Rub patch adhesive wings for 2 minutes. Remove white label marked "1". Remove the white  label marked "2". Rub patch adhesive wings for 2 additional minutes.  While looking in a mirror, press and release button in center of patch. A small green light will  flash 3-4 times.  This will be your only indicator that the monitor has been turned on.  Do not shower for the first 24 hours. You may shower after the first 24 hours.  Press the button if you feel a symptom. You will hear a small click. Record Date, Time and  Symptom in the Patient Logbook.  When you are ready to remove the patch, follow instructions on the last 2 pages of Patient  Logbook. Stick patch  monitor onto the last page of Patient Logbook.  Place Patient Logbook in the blue and white box. Use locking tab on box and tape box closed  securely. The blue and white box has prepaid postage on it. Please place it in the mailbox as  soon as possible. Your physician should have your test results approximately 7 days after the  monitor has been mailed back to [facility].  Call [facility] Customer Care at [phone] if you have questions regarding  your ZIO XT patch monitor. Call them immediately if you see an orange light blinking on your  monitor.  If your monitor falls off in less than 4 days, contact our Monitor department at [phone].  If your monitor becomes loose or falls off after 4 days call [employer] at [phone] for  suggestions on securing your monitor    Follow-Up: At [facility], you and your health needs are our priority.  As part of our continuing mission to provide you with exceptional heart care, we have created designated Provider Care Teams.  These Care Teams include your primary Cardiologist (physician) and Advanced Practice Providers (APPs -  Physician Assistants and Nurse Practitioners) who all work together to provide you with the care you need, when you need it.  We recommend signing up for the patient portal called "MyChart".  Sign up information is provided on this After Visit Summary.  MyChart is used to connect with patients for Virtual Visits (Telemedicine).  Patients are able to view lab/test results, encounter notes, upcoming appointments, etc.  Non-urgent messages can be sent to your provider as well.   To learn more about what you can do with MyChart, go to [email or web address].    Your next appointment:   3 month(s)  Provider:   Eligha Bridegroom, NP

## 2022-08-25 ENCOUNTER — Telehealth: Payer: Self-pay | Admitting: *Deleted

## 2022-08-25 ENCOUNTER — Other Ambulatory Visit: Payer: Self-pay | Admitting: Nurse Practitioner

## 2022-08-25 ENCOUNTER — Ambulatory Visit (HOSPITAL_COMMUNITY): Payer: Medicare Other | Attending: Nurse Practitioner

## 2022-08-25 DIAGNOSIS — R000 Tachycardia, unspecified: Secondary | ICD-10-CM | POA: Diagnosis not present

## 2022-08-25 DIAGNOSIS — R0602 Shortness of breath: Secondary | ICD-10-CM | POA: Diagnosis not present

## 2022-08-25 LAB — COMPREHENSIVE METABOLIC PANEL
ALT: 16 IU/L (ref 0–44)
AST: 22 IU/L (ref 0–40)
Albumin: 4.6 g/dL (ref 3.8–4.8)
Alkaline Phosphatase: 50 IU/L (ref 44–121)
BUN/Creatinine Ratio: 15 (ref 10–24)
BUN: 13 mg/dL (ref 8–27)
Bilirubin Total: 1 mg/dL (ref 0.0–1.2)
CO2: 20 mmol/L (ref 20–29)
Calcium: 9.8 mg/dL (ref 8.6–10.2)
Chloride: 103 mmol/L (ref 96–106)
Creatinine, Ser: 0.89 mg/dL (ref 0.76–1.27)
Globulin, Total: 2.2 g/dL (ref 1.5–4.5)
Glucose: 167 mg/dL — ABNORMAL HIGH (ref 70–99)
Potassium: 4.5 mmol/L (ref 3.5–5.2)
Sodium: 139 mmol/L (ref 134–144)
Total Protein: 6.8 g/dL (ref 6.0–8.5)
eGFR: 92 mL/min/{1.73_m2} (ref >59)

## 2022-08-25 LAB — ECHOCARDIOGRAM COMPLETE
Area-P 1/2: 4.75 cm2
S' Lateral: 2.5 cm

## 2022-08-25 LAB — TSH RFX ON ABNORMAL TO FREE T4: TSH: 0.498 u[IU]/mL (ref 0.450–4.500)

## 2022-08-25 MED ORDER — METOPROLOL TARTRATE 25 MG PO TABS: 25.0000 mg | ORAL_TABLET | Freq: Two times a day (BID) | ORAL | 3 refills | Status: DC

## 2022-08-25 NOTE — Telephone Encounter (Signed)
Called pt to schedule Appt. Pt stated send mychart message with Date and time. Message sent and scheduled.

## 2022-08-25 NOTE — Telephone Encounter (Signed)
Pls call him after 3 to schedule him back to see me either Mon or Wed in the held spots

## 2022-08-27 NOTE — Progress Notes (Signed)
Cardiology Office Note:  .   Date:  08/29/2022  ID:  Charles Solis, DOB Jun 15, 1950, MRN [id number] PCP: Theodis Shove, DO  [facility] Providers Cardiologist:  Kristeen Miss, MD    Patient Profile: .      PMH Hyperlipidemia LDL particle number of 1229 in 2018 >> improved to 834 10 months later Apolipoprotein B =  71 on 12/21/2016 Hypertension Family history CAD  Father had MI and CABG age 72 CT calcium score 09/29/2014 of 50 (48th percentile) Hypothyroidism  Referred to cardiology for management of hyperlipidemia. Family history of CAD.  He has maintained consistent follow-up with no major cardiac conditions.  Last cardiology clinic visit was 11/23/2021 with Dr. Elease Hashimoto at which time LDL cholesterol was 50.  Blood pressure was well-controlled and he had no specific cardiac symptoms.  He was advised to return in 1 year for follow-up.  Seen by me on 08/24/22 for for evaluation of shortness of breath and fatigue for approximately 3 months. Is napping more frequently, unusual for him. Does elliptical 5 days per week starting at resistance of 9 and gradually reduces resistance to reduce stress on his back. Can go for 15-20 minutes at resistance of 5-6 without significant shortness of breath. He denied chest pain, palpitations, orthopnea, PND, presyncope, or syncope. Symptoms of SOB and fatigue more with heavy lifting. Was not aware that HR was elevated today. Typical HR in low to mid 80s bpm. Only time he usually notes elevated HR is when he is flying and he has not flown recently. Admits to frequent diet Coke and tea intake, but limits it especially in the afternoon. Has upcoming stressful trial that has been pending for 3 years, sometimes wakes him up at night but no increased stress recently. No recent changes in medical history or trauma.        History of Present Illness: .   [name] is a very pleasant 72 y.o. male who presents for follow-up. Lab work collected at last ov did  not reveal significant abnormality including normal CBC, negative D-dimer, normal TSH, and normal kidney and liver function with stable electrolytes.  2D echo was obtained 08/25/2022 and revealed normal LVEF 55-60%, G1 DD, normal RV, no significant valve disease. A 3 day Zio was placed and we are still awaiting results. He is experiencing dizziness and having hypotension with BP < 100 mmHg on a few occasions over the weekend. Dr. Berneice Heinrich, urologist, had recently changed him to silodosin to alleviate dizziness on a different alpha blocker previously prescribed. He felt less dizzy until starting metoprolol. No additional pain, illness or changes in medical history. Has visual changes that are prodrome for migraine, but these have been present for a while. No chest pain, edema, orthopnea, PND, palpitations, presyncope, or syncope.   ROS: + DOE, + dizziness        Studies Reviewed: Marland Kitchen   EKG Interpretation Date/Time:  Monday August 29 2022 14:51:33 EDT Ventricular Rate:  91 PR Interval:  156 QRS Duration:  68 QT Interval:  326 QTC Calculation: 400 R Axis:   36  Text Interpretation: Normal sinus rhythm Low voltage QRS When compared with ECG of 24-Aug-2022 14:06, No significant change was found Confirmed by Eligha Bridegroom [phone]) on 08/29/2022 3:14:32 PM     Risk Assessment/Calculations:             Physical Exam:   VS:  BP 96/60   Pulse 91   Ht 6' (1.829 m)   Wt 182  lb 12.8 oz (82.9 kg)   SpO2 98%   BMI 24.79 kg/m    Wt Readings from Last 3 Encounters:  08/29/22 182 lb 12.8 oz (82.9 kg)  08/24/22 185 lb 9.6 oz (84.2 kg)  11/23/21 183 lb 6.4 oz (83.2 kg)    GEN: Well nourished, well developed in no acute distress NECK: No JVD; No carotid bruits CARDIAC: tachy rate, regular rhythm, no murmurs, rubs, gallops RESPIRATORY:  Clear to auscultation without rales, wheezing or rhonchi  ABDOMEN: Soft, non-tender, non-distended EXTREMITIES:  No edema; No deformity     ASSESSMENT AND PLAN: .     Shortness of breath and fatigue: Presented 08/24/22 with 3 month history of fatigue and dyspnea on exertion, most notable when he is moving or carrying something heavy. Found to have sinus tachycardia with HR 122 bpm on EKG. No clear indication for ST, including negative D-dimer, normal TSH, no electrolyte abnormality, no abnormality on CBC, and normal liver and kidney function. Today, he reports no change in symptoms of fatigue and DOE. Since starting metoprolol, he is having dizziness. Case reviewed with primary cardiologist, Dr. Elease Hashimoto.  Recommendation to d/c hydrochlorothiazide and potassium supplement, improve hydration and increase electrolytes to improve BP and decrease dizziness.  Resume exercise and notify us if symptoms worsen.  Continue to monitor HR and BP and report in 2 to 3 days.  Sinus tachycardia: EKG today reveals sinus rhythm at 91 bpm. He is taking metoprolol 25 mg twice daily. As noted below, he is having dizziness. We will continue this dose and discontinue HCTZ with hopes to improve BP.  CAD without angina: CT calcium score 09/2014 with coronary calcium score of 50 (48th percentile). He denies chest pain. Is having DOE and fatigue as described above. Encouraged him to resume elliptical work outs to see if symptoms worsen. Consider further ischemic evaluation if symptoms persist without specific cause.   Orthostatic hypotension/History of hypertension: He is having orthostatic hypotension since starting metoprolol.  We will have him discontinue hydrochlorothiazide and potassium supplement. Continue to monitor BP and notify us if there is no improvement. We have asked him to send a log of BP and HR in the next few days.   Hyperlipidemia LDL goal < 70: LDL 61 on 11/16/20. Need to repeat lipid panel at next visit. Continue ezetimibe and rosuvastatin.       Dispo: 3 months with Dr. Elease Hashimoto or me   Signed, Eligha Bridegroom, NP-C

## 2022-08-28 ENCOUNTER — Encounter: Payer: Self-pay | Admitting: Cardiovascular Disease

## 2022-08-29 ENCOUNTER — Encounter: Payer: Self-pay | Admitting: Nurse Practitioner

## 2022-08-29 ENCOUNTER — Ambulatory Visit: Payer: Medicare Other | Attending: Nurse Practitioner | Admitting: Nurse Practitioner

## 2022-08-29 VITALS — BP 96/60 | HR 91 | Ht 72.0 in | Wt 182.8 lb

## 2022-08-29 DIAGNOSIS — R5383 Other fatigue: Secondary | ICD-10-CM | POA: Diagnosis not present

## 2022-08-29 DIAGNOSIS — R931 Abnormal findings on diagnostic imaging of heart and coronary circulation: Secondary | ICD-10-CM | POA: Diagnosis not present

## 2022-08-29 DIAGNOSIS — R0609 Other forms of dyspnea: Secondary | ICD-10-CM | POA: Insufficient documentation

## 2022-08-29 DIAGNOSIS — R000 Tachycardia, unspecified: Secondary | ICD-10-CM | POA: Diagnosis not present

## 2022-08-29 DIAGNOSIS — I951 Orthostatic hypotension: Secondary | ICD-10-CM | POA: Insufficient documentation

## 2022-08-29 DIAGNOSIS — I10 Essential (primary) hypertension: Secondary | ICD-10-CM | POA: Insufficient documentation

## 2022-08-29 NOTE — Patient Instructions (Signed)
Medication Instructions:  Your physician has recommended you make the following change in your medication:  STOP hydrochlorothiazide and Potassium.  *If you need a refill on your cardiac medications before your next appointment, please call your pharmacy*   Lab Work: None  If you have labs (blood work) drawn today and your tests are completely normal, you will receive your results only by: MyChart Message (if you have MyChart) OR A paper copy in the mail If you have any lab test that is abnormal or we need to change your treatment, we will call you to review the results.   Testing/Procedures: None   Follow-Up: At [facility], you and your health needs are our priority.  As part of our continuing mission to provide you with exceptional heart care, we have created designated Provider Care Teams.  These Care Teams include your primary Cardiologist (physician) and Advanced Practice Providers (APPs -  Physician Assistants and Nurse Practitioners) who all work together to provide you with the care you need, when you need it.  We recommend signing up for the patient portal called "MyChart".  Sign up information is provided on this After Visit Summary.  MyChart is used to connect with patients for Virtual Visits (Telemedicine).  Patients are able to view lab/test results, encounter notes, upcoming appointments, etc.  Non-urgent messages can be sent to your provider as well.   To learn more about what you can do with MyChart, go to [email or web address].    Your next appointment:   3 month(s)  Provider:   Eligha Bridegroom, NP         Other Instructions Your provider recommends increasing hydration, V8 juice, Sugar Free Liquid IV, and Gator light.

## 2022-09-01 DIAGNOSIS — R000 Tachycardia, unspecified: Secondary | ICD-10-CM | POA: Diagnosis not present

## 2022-09-01 DIAGNOSIS — Z8546 Personal history of malignant neoplasm of prostate: Secondary | ICD-10-CM | POA: Diagnosis not present

## 2022-09-01 DIAGNOSIS — I2510 Atherosclerotic heart disease of native coronary artery without angina pectoris: Secondary | ICD-10-CM | POA: Diagnosis not present

## 2022-09-01 DIAGNOSIS — E785 Hyperlipidemia, unspecified: Secondary | ICD-10-CM | POA: Diagnosis not present

## 2022-09-01 DIAGNOSIS — Z800 Family history of malignant neoplasm of digestive organs: Secondary | ICD-10-CM | POA: Diagnosis not present

## 2022-09-01 DIAGNOSIS — M419 Scoliosis, unspecified: Secondary | ICD-10-CM | POA: Diagnosis not present

## 2022-09-01 DIAGNOSIS — Z9889 Other specified postprocedural states: Secondary | ICD-10-CM | POA: Diagnosis not present

## 2022-09-01 DIAGNOSIS — Z85828 Personal history of other malignant neoplasm of skin: Secondary | ICD-10-CM | POA: Diagnosis not present

## 2022-09-01 DIAGNOSIS — I10 Essential (primary) hypertension: Secondary | ICD-10-CM | POA: Diagnosis not present

## 2022-09-01 DIAGNOSIS — Z87891 Personal history of nicotine dependence: Secondary | ICD-10-CM | POA: Diagnosis not present

## 2022-09-01 DIAGNOSIS — J309 Allergic rhinitis, unspecified: Secondary | ICD-10-CM | POA: Diagnosis not present

## 2022-09-05 DIAGNOSIS — R000 Tachycardia, unspecified: Secondary | ICD-10-CM | POA: Diagnosis not present

## 2022-09-15 DIAGNOSIS — H35371 Puckering of macula, right eye: Secondary | ICD-10-CM | POA: Diagnosis not present

## 2022-09-15 DIAGNOSIS — H2513 Age-related nuclear cataract, bilateral: Secondary | ICD-10-CM | POA: Diagnosis not present

## 2022-09-15 DIAGNOSIS — H52203 Unspecified astigmatism, bilateral: Secondary | ICD-10-CM | POA: Diagnosis not present

## 2022-09-15 DIAGNOSIS — H5203 Hypermetropia, bilateral: Secondary | ICD-10-CM | POA: Diagnosis not present

## 2022-09-19 ENCOUNTER — Other Ambulatory Visit (HOSPITAL_COMMUNITY): Payer: Medicare Other

## 2022-10-18 ENCOUNTER — Other Ambulatory Visit: Payer: Self-pay | Admitting: Cardiovascular Disease

## 2022-10-27 DIAGNOSIS — J309 Allergic rhinitis, unspecified: Secondary | ICD-10-CM | POA: Diagnosis not present

## 2022-10-27 DIAGNOSIS — Z23 Encounter for immunization: Secondary | ICD-10-CM | POA: Diagnosis not present

## 2022-10-27 DIAGNOSIS — R000 Tachycardia, unspecified: Secondary | ICD-10-CM | POA: Diagnosis not present

## 2022-10-27 DIAGNOSIS — I700 Atherosclerosis of aorta: Secondary | ICD-10-CM | POA: Diagnosis not present

## 2022-11-01 DIAGNOSIS — Z23 Encounter for immunization: Secondary | ICD-10-CM | POA: Diagnosis not present

## 2022-11-07 DIAGNOSIS — M25561 Pain in right knee: Secondary | ICD-10-CM | POA: Diagnosis not present

## 2022-11-07 DIAGNOSIS — M25562 Pain in left knee: Secondary | ICD-10-CM | POA: Diagnosis not present

## 2022-11-07 DIAGNOSIS — M170 Bilateral primary osteoarthritis of knee: Secondary | ICD-10-CM | POA: Diagnosis not present

## 2022-11-11 ENCOUNTER — Other Ambulatory Visit: Payer: Self-pay | Admitting: Cardiovascular Disease

## 2022-11-24 ENCOUNTER — Ambulatory Visit: Payer: Medicare Other | Admitting: Nurse Practitioner

## 2022-11-25 ENCOUNTER — Ambulatory Visit: Payer: Medicare Other | Admitting: Cardiovascular Disease

## 2022-11-29 ENCOUNTER — Ambulatory Visit (INDEPENDENT_AMBULATORY_CARE_PROVIDER_SITE_OTHER): Payer: Medicare Other | Admitting: Audiology

## 2022-11-29 DIAGNOSIS — H903 Sensorineural hearing loss, bilateral: Secondary | ICD-10-CM

## 2022-11-29 NOTE — Progress Notes (Signed)
[facility] [address] [place], [place] [address]  Hearing Aid Check   Charles Solis comes today for a hearing aid check after he called requesting assistance.  Patient is a longstanding patient of Deatra Ina Au.D. and has a set of Phonak Audeo [id number].   The patient reports that he has not been able to get used to the changes made last time in March and wants to reduce the settings. Made changes to the hearing aid settings to meet patient comfort.   Patient reports to be pleased with the changes.   No charges today.  Recommend:  Reprogram hearing aids and complete live speech mapping, if issues persist.  Follow up with ENT as per his physician. Repeat audiogram if hearing changes are perceived or as per MD.    Marcelino Duster MARIE LEROUX-MARTINEZ, Au.D. Clinical Audiologist

## 2022-12-15 DIAGNOSIS — K219 Gastro-esophageal reflux disease without esophagitis: Secondary | ICD-10-CM | POA: Diagnosis not present

## 2022-12-19 ENCOUNTER — Encounter: Payer: Self-pay | Admitting: Cardiovascular Disease

## 2022-12-19 NOTE — Progress Notes (Unsigned)
Cardiology Office Note   Date:  12/20/2022   ID:  Charles Solis, DOB 03-Dec-1950, MRN [id number]  PCP:  Theodis Shove, DO  Cardiologist:   Kristeen Miss, MD   Chief Complaint  Patient presents with   Hypertension        Hyperlipidemia   Problems: 1. Hyperlipidemia 2. hypothyroidism    Charles Solis is a 72 y.o. male who presents for hyperlipidemia.  Charles Solis has seen Dr. Allena Napoleon over the past years.  He has had his TSH checked by Dr. Alessandra Bevels and it is normal .   Was tried on Atorvastatin and had some mental sluggishness fairly shortly after that.  His recent lab work shows total cholesterol 253, triglyceride levels 133, LDL cholesterols 179.  His LDL particle number is 2349   Is otherwise healthy. Does have a family hx of CAD Father had MI and CABG at age 79     Has never had any chest pain . Eats fairly well.    Works out regularly .    04/20/2015: Charles Solis is seen today for follow-up of his hypercholesterolemia. Coronary calcium score:  IMPRESSION: Coronary calcium score of 50. This was 48 percentile for age and sex matched control.  Was started on Crestor - tolerating fairly well,   Did not tolerate Atorvastatin in the past. Had some brain foggyness.  He's cut his dose to Crestor 10 mg 3 times a week approximate 4 months ago. His LDL particle number has decreased slightly. In August his LDL particle number was 1890 After the crestor 3 times a week, it came down to 1261.   Oct. 9 , 2017   Charles Solis is see today for follow Up of his hyperlipidemia. Cardio IQ on June 6 revealed an LDL particle number of 1042.   He has a LDL pattern A which is desirable .  Takes Crestor 10 mg M,W, F, Sat.  No CP or dyspnea.  Staying very active.  Watches his diet .   June 08, 2016:  Charles Solis is seen back for follow up of his CAD  LDL particle number was 1229 in Jan. 2018: Taking Crestor 20 mg a day , Zetia 10 mg a day  Feeling well Has some   Nov. 7, 2108  Charles Solis is doing  well.   Still working - Scientist, research (medical) . No CP or dyspnea.   Still exercising  Family hx of CAD but no known CAD in him   October 19, 2017:  Charles Solis is seen today for follow-up of his hyperlipidemia. Is on Rosuvastatin  Has put on some weight .  Trying to eat better   November 16, 2018  No CP , no dyspna.  Does elliptical regulalry  Has been doing PT for back injury .  Still working  Will [employer] is 187 today   November 19, 2019: Charles Solis is seen today for follow-up of his hyperlipidemia. Wt is 185 lbs.  Working out regularly .    Oct. 3, 2022: Charles Solis is seen today for follow up of his HLD Still very active Wt is 187 lbs Does the elliptical. 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 10, 2023 Charles Solis is seen for follow up of his HLT, HTN Active,  exercising some .  No cp , no dyspnea   Works as an Scientist, research (medical) ,  Had COVID in June.  Had a reaction with Paxlovid.   Nausea and vomitting  Paxlovid makes people more sensitive to altitude sickness.   Labs from his primary medical doctor in February, 2023 reveal a total cholesterol of 106.  HDL is 44.  Triglyceride level is 66.  LDL is 50.  Glucose level is 81.  Creatinine is 0.88.  Potassium is 3.9.   Nov. 5, 2024  Charles Solis is seen for follow up of his HTN, HLD ,  Still exercising  Still working  No CP , no dyspnea  HR  has been well controlled since we added metoprolol       Past Medical History:  Diagnosis Date   Hypothyroidism    Prostate cancer [facility])     Past Surgical History:  Procedure Laterality Date   TONSILLECTOMY       Current Outpatient Medications  Medication Sig Dispense Refill   ezetimibe (ZETIA) 10 MG tablet TAKE ONE TABLET BY MOUTH EVERY DAY 90 tablet 3   finasteride (PROSCAR) 5 MG tablet Take 5 mg by mouth daily.     ipratropium (ATROVENT) 0.03 % nasal spray Place 1 spray into both nostrils daily as  needed.     metoprolol tartrate (LOPRESSOR) 25 MG tablet Take 1 tablet (25 mg total) by mouth 2 (two) times daily. 180 tablet 3   rosuvastatin (CRESTOR) 20 MG tablet TAKE 1 TABLET BY MOUTH ONCE DAILY 90 tablet 3   Saw Palmetto 500 MG CAPS Take 1 capsule 3 (three) times daily by mouth.     silodosin (RAPAFLO) 8 MG CAPS capsule Take 8 mg by mouth daily with breakfast.     tadalafil (CIALIS) 5 MG tablet Take 1 tablet by mouth daily.     No current facility-administered medications for this visit.    Allergies:   Patient has no known allergies.    Social History:  The patient  reports that he has quit smoking. He has never used smokeless tobacco. He reports current alcohol use. He reports that he does not use drugs.   Family History:  The patient's family history includes Alzheimer's disease in his mother and paternal grandmother; Colon cancer in his father; Coronary artery disease in his maternal grandfather; Heart attack in his father and maternal grandfather; Heart disease in his paternal grandfather; Hyperlipidemia in his sister; Hypertension in his mother; Lung cancer in his paternal grandfather.    ROS:  Please see the history of present illness.    Review of Systems: As noted in HPI,  Otherwise negative     Physical Exam: Blood pressure 114/78, pulse 98, height 6\' 1"  (1.854 m), weight 188 lb (85.3 kg), SpO2 98%.      GEN:  Well nourished, well developed in no acute distress HEENT: Normal NECK: No JVD; No carotid bruits LYMPHATICS: No lymphadenopathy CARDIAC: RRR , no murmurs, rubs, gallops RESPIRATORY:  Clear to auscultation without rales, wheezing or rhonchi  ABDOMEN: Soft, non-tender, non-distended MUSCULOSKELETAL:  No edema; No deformity  SKIN: Warm and dry NEUROLOGIC:  Alert and oriented x 3   EKG:      Recent Labs: 08/24/2022: ALT 16; BUN 13; Creatinine, Ser 0.89; Hemoglobin 16.0; Platelets 214; Potassium 4.5; Sodium 139; TSH 0.498    Lipid Panel     Component Value Date/Time   CHOL 117 11/16/2020 1030   CHOL 170 07/21/2015 0916   TRIG 56 11/16/2020 1030   TRIG 61 12/21/2016 0955   HDL 43 11/16/2020 1030   HDL 51 12/21/2016 0955   CHOLHDL 2.7  11/16/2020 1030   CHOLHDL 3.0 07/21/2015 0916   LDLCALC 61 11/16/2020 1030   LDLCALC 95 07/21/2015 0916      Wt Readings from Last 3 Encounters:  12/20/22 188 lb (85.3 kg)  08/29/22 182 lb 12.8 oz (82.9 kg)  08/24/22 185 lb 9.6 oz (84.2 kg)      Other studies Reviewed: Additional studies/ records that were reviewed today include: . Review of the above records demonstrates:    ASSESSMENT AND PLAN:  1.  Hyperlipidemia: .  Last lipids were drawn 2 years ago.  His LDL is at goal.  Continue current medications.     2.  Mild hypertension:    Blood pressure is well-controlled.   Will have him follow-up with Dr. Gaynelle Arabian in 1 year.   Current medicines are reviewed at length with the patient today.  The patient does not have concerns regarding medicines.  The following changes have been made:  no change  Labs/ tests ordered today include:   No orders of the defined types were placed in this encounter.    Disposition:    1 year with Dr. Cornell Barman, MD  12/20/2022 2:17 PM    [facility] [address] [place], [place], [place]  [address] Phone: [phone]; Fax: [phone]

## 2022-12-20 ENCOUNTER — Ambulatory Visit: Payer: Medicare Other | Attending: Nurse Practitioner | Admitting: Cardiovascular Disease

## 2022-12-20 ENCOUNTER — Encounter: Payer: Self-pay | Admitting: Cardiovascular Disease

## 2022-12-20 VITALS — BP 114/78 | HR 98 | Ht 73.0 in | Wt 188.0 lb

## 2022-12-20 DIAGNOSIS — R931 Abnormal findings on diagnostic imaging of heart and coronary circulation: Secondary | ICD-10-CM | POA: Diagnosis not present

## 2022-12-20 DIAGNOSIS — E785 Hyperlipidemia, unspecified: Secondary | ICD-10-CM | POA: Insufficient documentation

## 2022-12-20 NOTE — Patient Instructions (Signed)
Lab Work: Lipids, ALT, BMET today If you have labs (blood work) drawn today and your tests are completely normal, you will receive your results only by: MyChart Message (if you have MyChart) OR A paper copy in the mail If you have any lab test that is abnormal or we need to change your treatment, we will call you to review the results.  Follow-Up: At [facility], you and your health needs are our priority.  As part of our continuing mission to provide you with exceptional heart care, we have created designated Provider Care Teams.  These Care Teams include your primary Cardiologist (physician) and Advanced Practice Providers (APPs -  Physician Assistants and Nurse Practitioners) who all work together to provide you with the care you need, when you need it.  Your next appointment:   1 year(s)  Provider:   Nathaniel Man, MD

## 2022-12-21 LAB — BASIC METABOLIC PANEL
BUN/Creatinine Ratio: 18 (ref 10–24)
BUN: 18 mg/dL (ref 8–27)
CO2: 23 mmol/L (ref 20–29)
Calcium: 9.1 mg/dL (ref 8.6–10.2)
Chloride: 106 mmol/L (ref 96–106)
Creatinine, Ser: 1 mg/dL (ref 0.76–1.27)
Glucose: 81 mg/dL (ref 70–99)
Potassium: 4.6 mmol/L (ref 3.5–5.2)
Sodium: 143 mmol/L (ref 134–144)
eGFR: 80 mL/min/{1.73_m2} (ref >59)

## 2022-12-21 LAB — LIPID PANEL
Chol/HDL Ratio: 2.6 ratio (ref 0.0–5.0)
Cholesterol, Total: 115 mg/dL (ref 100–199)
HDL: 44 mg/dL (ref >39)
LDL Chol Calc (NIH): 55 mg/dL (ref 0–99)
Triglycerides: 79 mg/dL (ref 0–149)
VLDL Cholesterol Cal: 16 mg/dL (ref 5–40)

## 2022-12-21 LAB — ALT: ALT: 20 [IU]/L (ref 0–44)

## 2023-01-01 DIAGNOSIS — S61216A Laceration without foreign body of right little finger without damage to nail, initial encounter: Secondary | ICD-10-CM | POA: Diagnosis not present

## 2023-01-01 DIAGNOSIS — Z6825 Body mass index (BMI) 25.0-25.9, adult: Secondary | ICD-10-CM | POA: Diagnosis not present

## 2023-01-01 DIAGNOSIS — E663 Overweight: Secondary | ICD-10-CM | POA: Diagnosis not present

## 2023-01-01 DIAGNOSIS — R030 Elevated blood-pressure reading, without diagnosis of hypertension: Secondary | ICD-10-CM | POA: Diagnosis not present

## 2023-01-24 DIAGNOSIS — C61 Malignant neoplasm of prostate: Secondary | ICD-10-CM | POA: Diagnosis not present

## 2023-01-31 DIAGNOSIS — R350 Frequency of micturition: Secondary | ICD-10-CM | POA: Diagnosis not present

## 2023-01-31 DIAGNOSIS — C61 Malignant neoplasm of prostate: Secondary | ICD-10-CM | POA: Diagnosis not present

## 2023-02-21 DIAGNOSIS — L570 Actinic keratosis: Secondary | ICD-10-CM | POA: Diagnosis not present

## 2023-02-21 DIAGNOSIS — L812 Freckles: Secondary | ICD-10-CM | POA: Diagnosis not present

## 2023-02-21 DIAGNOSIS — D225 Melanocytic nevi of trunk: Secondary | ICD-10-CM | POA: Diagnosis not present

## 2023-02-21 DIAGNOSIS — Z85828 Personal history of other malignant neoplasm of skin: Secondary | ICD-10-CM | POA: Diagnosis not present

## 2023-02-21 DIAGNOSIS — L821 Other seborrheic keratosis: Secondary | ICD-10-CM | POA: Diagnosis not present

## 2023-03-16 DIAGNOSIS — M170 Bilateral primary osteoarthritis of knee: Secondary | ICD-10-CM | POA: Diagnosis not present

## 2023-03-23 DIAGNOSIS — M170 Bilateral primary osteoarthritis of knee: Secondary | ICD-10-CM | POA: Diagnosis not present

## 2023-03-30 DIAGNOSIS — M170 Bilateral primary osteoarthritis of knee: Secondary | ICD-10-CM | POA: Diagnosis not present

## 2023-05-22 ENCOUNTER — Ambulatory Visit (INDEPENDENT_AMBULATORY_CARE_PROVIDER_SITE_OTHER): Payer: Medicare Other | Admitting: Otolaryngology

## 2023-05-22 ENCOUNTER — Encounter (INDEPENDENT_AMBULATORY_CARE_PROVIDER_SITE_OTHER): Payer: Self-pay

## 2023-05-22 VITALS — BP 123/77 | HR 89 | Ht 73.0 in | Wt 185.0 lb

## 2023-05-22 DIAGNOSIS — H903 Sensorineural hearing loss, bilateral: Secondary | ICD-10-CM | POA: Diagnosis not present

## 2023-05-22 NOTE — Progress Notes (Unsigned)
 Patient ID: Charles Solis, male   DOB: 22-Sep-1950, 73 y.o.   MRN: [id number]  Follow-up: Hearing loss  HPI: The patient is a 73 year old male who returns today for his follow-up evaluation.  The patient was last seen 1 year ago.  At that time, he was noted to have bilateral high-frequency sensorineural hearing loss, slightly worse on the left side.  The patient uses bilateral hearing aids.  The patient returns today reporting no significant change in his hearing.  He denies any otalgia, otorrhea, or vertigo. No other ENT, GI, or respiratory issue noted since the last visit.   Exam: General: Communicates without difficulty, well nourished, no acute distress. Head: Normocephalic, no evidence injury, no tenderness, facial buttresses intact without stepoff. Eyes: PERRL, EOMI. No scleral icterus, conjunctivae clear. Neuro: CN II exam reveals vision grossly intact. No nystagmus at any point of gaze. Ears: Auricles well formed without lesions. Ear canals are intact without mass or lesion. No erythema or edema is appreciated. The TMs are intact without fluid. Nose: External evaluation reveals normal support and skin without lesions. Dorsum is intact. Anterior rhinoscopy reveals healthy pink mucosa over anterior aspect of inferior turbinates and intact septum. No purulence noted. Oral:  Oral cavity and oropharynx are intact, symmetric, without erythema or edema. Mucosa is moist without lesions. Neck: Full range of motion without pain. There is no significant lymphadenopathy. No masses palpable. Thyroid bed within normal limits to palpation. Parotid glands and submandibular glands equal bilaterally without mass. Trachea is midline. Neuro:  CN 2-12 grossly intact. Gait normal. Vestibular: No nystagmus at any point of gaze.   Assessment: 1. The patient's ear canals, tympanic membranes and middle ear spaces are all normal.  2. Subjectively stable bilateral high frequency sensorineural hearing loss.  Plan: 1. The  physical exam findings are reviewed with the patient.  2. Continue with his hearing aids.  3. The patient will return for re-evaluation in 1 year with repeat audio.

## 2023-05-23 DIAGNOSIS — H903 Sensorineural hearing loss, bilateral: Secondary | ICD-10-CM | POA: Insufficient documentation

## 2023-05-25 ENCOUNTER — Ambulatory Visit (INDEPENDENT_AMBULATORY_CARE_PROVIDER_SITE_OTHER): Payer: Self-pay | Admitting: Audiology

## 2023-05-25 DIAGNOSIS — H903 Sensorineural hearing loss, bilateral: Secondary | ICD-10-CM

## 2023-05-25 NOTE — Progress Notes (Signed)
  [address], [address] [place], [place] [address] [phone]  Hearing Aid Check     Xzavien Harada comes for a scheduled appointment for a hearing aid check.  Time in:8:31am Time out:8:40am Accompanied BJ:[id number]   Right Left  Hearing aid manufacturer Deno Etienne B70R [id number] Deno Etienne B70R [id number]  Hearing aid style Receiver in the ear Receiver in the ear  Hearing aid battery rechargeable rechargeable  Receiver    Dome/ custom earpiece    Retention wire yes yes  Warranty expiration date 11-22-2019 11-22-2019  Loss and Damage    Additional accessories Expiration date [employer] air II  Initial fitting date 08-31-16 03-03-16  Device was fit at: [facility]    Chief complaint: Patient reports the changes made the last time he was here last year at [facility] seem to be too loud for him.  Actions taken: Inspection of the device and listening check showed that both were working well and were clean. Changed gain in the middle frequencies and slight in the high frequencies. Patient reported comfort. .  Services fee: $30 was paid at checkout.  Patient was oriented about the importance of using it 10-12 hours / day. Also he was oriented that ideally live speech mapping is completed when settings are changed. The equipment to do it is not available at this clinic yet. Patient may choose to go to another clinic to have that done. .  Recommend: Return for a hearing aid check , as needed. Return for a hearing evaluation and to see an ENT, if concerns with hearing changes arise.    Juneau Doughman MARIE LEROUX-MARTINEZ, AUD

## 2023-06-06 DIAGNOSIS — R599 Enlarged lymph nodes, unspecified: Secondary | ICD-10-CM | POA: Diagnosis not present

## 2023-06-06 DIAGNOSIS — R058 Other specified cough: Secondary | ICD-10-CM | POA: Diagnosis not present

## 2023-06-06 DIAGNOSIS — J029 Acute pharyngitis, unspecified: Secondary | ICD-10-CM | POA: Diagnosis not present

## 2023-06-06 DIAGNOSIS — R0981 Nasal congestion: Secondary | ICD-10-CM | POA: Diagnosis not present

## 2023-06-28 ENCOUNTER — Other Ambulatory Visit: Payer: Self-pay | Admitting: Adult Health

## 2023-06-28 DIAGNOSIS — R42 Dizziness and giddiness: Secondary | ICD-10-CM | POA: Diagnosis not present

## 2023-06-28 DIAGNOSIS — K649 Unspecified hemorrhoids: Secondary | ICD-10-CM | POA: Diagnosis not present

## 2023-06-28 DIAGNOSIS — Z87891 Personal history of nicotine dependence: Secondary | ICD-10-CM | POA: Diagnosis not present

## 2023-06-28 DIAGNOSIS — R599 Enlarged lymph nodes, unspecified: Secondary | ICD-10-CM

## 2023-06-28 DIAGNOSIS — Z8546 Personal history of malignant neoplasm of prostate: Secondary | ICD-10-CM | POA: Diagnosis not present

## 2023-06-28 DIAGNOSIS — R590 Localized enlarged lymph nodes: Secondary | ICD-10-CM | POA: Diagnosis not present

## 2023-06-30 ENCOUNTER — Ambulatory Visit
Admission: RE | Admit: 2023-06-30 | Discharge: 2023-06-30 | Disposition: A | Discharge status: Home / Self Care | Source: Ambulatory Visit | Attending: Adult Health | Admitting: Adult Health

## 2023-06-30 DIAGNOSIS — R599 Enlarged lymph nodes, unspecified: Secondary | ICD-10-CM

## 2023-06-30 DIAGNOSIS — R221 Localized swelling, mass and lump, neck: Secondary | ICD-10-CM | POA: Diagnosis not present

## 2023-07-12 ENCOUNTER — Telehealth (INDEPENDENT_AMBULATORY_CARE_PROVIDER_SITE_OTHER): Payer: Self-pay | Admitting: Otolaryngology

## 2023-07-12 NOTE — Telephone Encounter (Signed)
 Error

## 2023-07-13 ENCOUNTER — Telehealth (INDEPENDENT_AMBULATORY_CARE_PROVIDER_SITE_OTHER): Payer: Self-pay | Admitting: Otolaryngology

## 2023-07-13 DIAGNOSIS — R591 Generalized enlarged lymph nodes: Secondary | ICD-10-CM

## 2023-07-13 NOTE — Telephone Encounter (Signed)
 Patient of Dr. Darlin Ehrlich needs to be seen regarding a CT scan showing 2 Hypoechoic solid mass. Should the patient be scheduled soon or next available? Please advise

## 2023-07-17 ENCOUNTER — Telehealth (INDEPENDENT_AMBULATORY_CARE_PROVIDER_SITE_OTHER): Payer: Self-pay | Admitting: Audiology

## 2023-07-17 NOTE — Telephone Encounter (Signed)
 Given LAD noted, will get CT Neck prior to visit Evelina Hippo

## 2023-07-17 NOTE — Telephone Encounter (Signed)
 Patient informed of providers recommendations. Number provided for CT scan. Patient will call back to schedule office f/u once CT Scan is ordered.

## 2023-07-18 ENCOUNTER — Ambulatory Visit (HOSPITAL_BASED_OUTPATIENT_CLINIC_OR_DEPARTMENT_OTHER)
Admission: RE | Admit: 2023-07-18 | Discharge: 2023-07-18 | Disposition: A | Discharge status: Home / Self Care | Source: Ambulatory Visit | Attending: Otolaryngology | Admitting: Otolaryngology

## 2023-07-18 ENCOUNTER — Encounter (HOSPITAL_BASED_OUTPATIENT_CLINIC_OR_DEPARTMENT_OTHER): Payer: Self-pay

## 2023-07-18 DIAGNOSIS — C61 Malignant neoplasm of prostate: Secondary | ICD-10-CM | POA: Diagnosis not present

## 2023-07-18 DIAGNOSIS — R591 Generalized enlarged lymph nodes: Secondary | ICD-10-CM | POA: Diagnosis not present

## 2023-07-18 DIAGNOSIS — R599 Enlarged lymph nodes, unspecified: Secondary | ICD-10-CM | POA: Diagnosis not present

## 2023-07-18 DIAGNOSIS — I6523 Occlusion and stenosis of bilateral carotid arteries: Secondary | ICD-10-CM | POA: Diagnosis not present

## 2023-07-18 MED ORDER — IOHEXOL 300 MG/ML  SOLN
100.0000 mL | Freq: Once | INTRAMUSCULAR | Status: AC | PRN
  Administered 2023-07-18: 75 mL via INTRAVENOUS

## 2023-07-18 NOTE — Telephone Encounter (Signed)
 Called patient and informed him that the billing mistake was fixed.

## 2023-07-24 ENCOUNTER — Other Ambulatory Visit: Payer: Self-pay | Admitting: Nurse Practitioner

## 2023-07-31 ENCOUNTER — Telehealth (INDEPENDENT_AMBULATORY_CARE_PROVIDER_SITE_OTHER): Payer: Self-pay

## 2023-07-31 NOTE — Telephone Encounter (Signed)
 Patient called wanting to get his CT results.  I have called to get a stat reading

## 2023-08-03 ENCOUNTER — Telehealth (INDEPENDENT_AMBULATORY_CARE_PROVIDER_SITE_OTHER): Payer: Self-pay

## 2023-08-03 NOTE — Telephone Encounter (Signed)
 Patient was looking at results on my chart and wanted to know if he should start antibiotics prior to his appt next week

## 2023-08-08 DIAGNOSIS — C61 Malignant neoplasm of prostate: Secondary | ICD-10-CM | POA: Diagnosis not present

## 2023-08-11 ENCOUNTER — Encounter (INDEPENDENT_AMBULATORY_CARE_PROVIDER_SITE_OTHER): Payer: Self-pay | Admitting: Otolaryngology

## 2023-08-11 ENCOUNTER — Ambulatory Visit (INDEPENDENT_AMBULATORY_CARE_PROVIDER_SITE_OTHER): Admitting: Otolaryngology

## 2023-08-11 VITALS — BP 118/79 | HR 91 | Ht 73.0 in | Wt 185.0 lb

## 2023-08-11 DIAGNOSIS — K118 Other diseases of salivary glands: Secondary | ICD-10-CM | POA: Diagnosis not present

## 2023-08-11 NOTE — Patient Instructions (Signed)
 I have ordered an imaging study for you to complete prior to your next visit. Please call [facility] Scheduling at [phone] to schedule your imaging if you have not received a call within 24 hours. If you are unable to complete your imaging study prior to your next scheduled visit please call our office to let us know.

## 2023-08-11 NOTE — Progress Notes (Signed)
 Dear Dr. Judyth, Here is my assessment for our mutual patient, Charles Solis. Thank you for allowing me the opportunity to care for your patient. Please do not hesitate to contact me should you have any other questions. Sincerely, Dr. Eldora Blanch  Otolaryngology Clinic Note Referring provider: Dr. Judyth HPI:  Charles Solis is a 73 y.o. male kindly referred by Dr. Judyth for evaluation of left neck mass.  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 He was referred for a left parotid mass. Noticed in around April, no antecedent event. He thinks it is not growing, but is sensitive/uncomfortable. No ear pain. No biopsy of this. No abx. 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 Denies noting new skin lesions. Seeing dermatologist in early July.  ENT Surgery: no Personal or FHx of bleeding dz or anesthesia difficulty: no   GLP-1: no AP/AC: no  Tobacco: former (quit 1981, 10 pack yr)  PMHx: HTN, HLD, Prostate Cancer  Independent Review of Additional Tests or Records:  US  06/30/2023: noted left neck mass 2x1.5x1.5 cm, appears likely parotid? CT Neck 07/18/2023 independently interpreted: left tail of parotid mass ~2x2 cm, no noted cervical LAD that is worrisome  PMH/Meds/All/SocHx/FamHx/ROS:   Past Medical History:  Diagnosis Date   Hypothyroidism    Prostate cancer [facility])      Past Surgical History:  Procedure Laterality Date   TONSILLECTOMY      Family History  Problem Relation Age of Onset   Alzheimer's disease Mother    Hypertension Mother    Heart attack Father    Colon cancer Father    Coronary artery disease Maternal Grandfather    Heart attack Maternal Grandfather    Alzheimer's disease Paternal Grandmother    Lung cancer Paternal Grandfather    Heart disease  Paternal Grandfather    Hyperlipidemia Sister      Social Connections: Not on file      Current Outpatient Medications:    ezetimibe  (ZETIA ) 10 MG tablet, TAKE ONE TABLET BY MOUTH EVERY DAY, Disp: 90 tablet, Rfl: 3   finasteride (PROSCAR) 5 MG tablet, Take 5 mg by mouth daily., Disp: , Rfl:    ipratropium (ATROVENT) 0.03 % nasal spray, Place 1 spray into both nostrils daily as needed., Disp: , Rfl:    metoprolol  tartrate (LOPRESSOR ) 25 MG tablet, Take 1 tablet (25 mg total) by mouth 2 (two) times daily., Disp: 180 tablet, Rfl: 1   rosuvastatin  (CRESTOR ) 20 MG tablet, TAKE 1 TABLET BY MOUTH ONCE DAILY, Disp: 90 tablet, Rfl: 3   Saw Palmetto 500 MG CAPS, Take 1 capsule 3 (three) times daily by mouth., Disp: , Rfl:    silodosin (RAPAFLO) 8 MG CAPS capsule, Take 8 mg by mouth daily with breakfast., Disp: , Rfl:    tadalafil (CIALIS) 5 MG tablet, Take 1 tablet by mouth daily., Disp: , Rfl:    Physical Exam:   BP 118/79 (BP Location: Right Arm, Patient Position: Sitting, Cuff Size: Large)   Pulse 91   Ht 6' 1 (1.854 m)   Wt 185 lb (83.9 kg)   SpO2 94%   BMI 24.41 kg/m   Salient findings:  CN II-XII intact Bilateral EAC clear and TM intact with well pneumatized middle ear spaces Anterior rhinoscopy: Septum intact; bilateral inferior turbinates without significant hypertrophy No lesions of  oral cavity/oropharynx; dentition good No obviously palpable neck masses/lymphadenopathy/thyromegaly EXCEPT left tail of parotid mass, mobile, well demarcated Multiple facial scars - left papule (multiple) postauricular that patient reports have been present chronically (skin cancer?) No respiratory distress or stridor  Seprately Identifiable Procedures:  Prior to initiating any procedures, risks/benefits/alternatives were explained to the patient and verbal consent obtained. None today  Impression & Plans:  Charles Solis is a 73 y.o. male with:  1. Mass of left parotid gland    We discussed  benign and malignant etiologies for this. Would start with FNA. Depending on biopsy results, can consider excision He is to see his derm in July - would recommend looking at posterior auricular lesions - do not appear like carcinoma but low threshold to bx given parotid lesion depending on parotid FNA  - f/u 4 weeks  See below regarding exact medications prescribed this encounter including dosages and route: No orders of the defined types were placed in this encounter.     Thank you for allowing me the opportunity to care for your patient. Please do not hesitate to contact me should you have any other questions.  Sincerely, Eldora Blanch, MD Otolaryngologist (ENT), [facility] Phone: [phone] Fax: [phone]  08/11/2023, 1:26 PM   I have personally spent 41 minutes involved in face-to-face and non-face-to-face activities for this patient on the day of the visit.  Professional time spent excludes any procedures performed but includes the following activities, in addition to those noted in the documentation: preparing to see the patient (review of outside documentation and results), performing a medically appropriate examination, counseling, documenting in the electronic health record, extensive independently interpreting results (CT, US ).

## 2023-08-14 NOTE — Progress Notes (Signed)
 Karalee Wilkie POUR, MD  Arcadio Cope Approved for US  FNA BX of left parotid nodule.  hkm       Previous Messages    ----- Message ----- From: Rosemaria Inabinet Sent: 08/14/2023  11:16 AM EDT To: Ir Procedure Requests Subject: FW: US  FNA SALIVARY GLAND/PAROTID GLAND         ----- Message ----- From: Jakelyn Squyres Sent: 08/11/2023   1:44 PM EDT To: Tadao Emig; Ir Procedure Requests Subject: US  FNA SALIVARY GLAND/PAROTID GLAND            Procedure : US  FNA SALIVARY GLAND/PAROTID GLAND  Reason : left parotid nodule, req FNA Dx: Mass of left parotid gland [K11.8 (ICD-10-CM)]    History : Ct soft tissue neck w/ contrast , US  soft tissue head and neck  Provider : Tobie Eldora NOVAK, MD  Contact : [phone]

## 2023-08-15 DIAGNOSIS — C61 Malignant neoplasm of prostate: Secondary | ICD-10-CM | POA: Diagnosis not present

## 2023-08-15 NOTE — Progress Notes (Signed)
 Karalee Wilkie POUR, MD  Lashondra Vaquerano Yes, all FNA are local only  HKM       Previous Messages    ----- Message ----- From: Tachina Spoonemore Sent: 08/14/2023   1:48 PM EDT To: Wilkie POUR Karalee, MD Subject: RE: US  FNA SALIVARY GLAND/PAROTID GLAND        Thank you , sorry to ask again , would all FNA's be able to be local anesthetic ?  Just wanted to be sure ----- Message ----- From: Karalee Wilkie POUR, MD Sent: 08/14/2023  11:30 AM EDT To: Eleanor Mighty Subject: RE: US  FNA SALIVARY GLAND/PAROTID GLAND        Approved for US  FNA BX of left parotid nodule.  hkm ----- Message ----- From: Siarra Gilkerson Sent: 08/14/2023  11:16 AM EDT To: Ir Procedure Requests Subject: FW: US  FNA SALIVARY GLAND/PAROTID GLAND         ----- Message ----- From: Gayla Benn Sent: 08/11/2023   1:44 PM EDT To: Briante Loveall; Ir Procedure Requests Subject: US  FNA SALIVARY GLAND/PAROTID GLAND            Procedure : US  FNA SALIVARY GLAND/PAROTID GLAND  Reason : left parotid nodule, req FNA Dx: Mass of left parotid gland [K11.8 (ICD-10-CM)]    History : Ct soft tissue neck w/ contrast , US  soft tissue head and neck  Provider : Tobie Eldora NOVAK, MD  Contact : [phone]

## 2023-08-17 ENCOUNTER — Telehealth: Payer: Self-pay | Admitting: Cardiology

## 2023-08-17 NOTE — Telephone Encounter (Signed)
 Pt c/o Shortness Of Breath: STAT if SOB developed within the last 24 hours or pt is noticeably SOB on the phone  1. Are you currently SOB (can you hear that pt is SOB on the phone)? No  2. How long have you been experiencing SOB? 6 months  3. Are you SOB when sitting or when up moving around? Moving around  4. Are you currently experiencing any other symptoms? Dizziness, fatigue

## 2023-08-17 NOTE — Telephone Encounter (Signed)
 Call to patient to discuss concerns about symptoms.   Pt states he has been having increased SOB on exertion. He states he works out on his elliptical every day and has not noticed any change to his exercise tolerance, but when he is gardening or doing chores about the house he is more SOB on exertion than normal. He states he is also having to take several short naps a day due to fatigue which is new for him. He also reports dizziness/decreased balance when he tries to get dressed or he turns quickly. He reported many of these symptoms when he saw M. Swinyer on 08/2022. Meds were changed and symptoms seemed improved when he saw Dr. Alveta on 12/2022. He does not check his HR or BP at home but he was seen by his ENT 08/11/23 and BP was 118/79 and HR was 91. Of note, patient has an upcoming biopsy 08/31/23 for a mass of the parotid gland.  Made appt with EMERSON Bane for 09/11/23. Discussed precautions such as not working out on elliptical if he feels lightheaded, calling 911 if he has loss of consciousness or near loss of consciousness or escalating SOB that does not get better with rest. Patient verbalizes understanding.

## 2023-08-22 DIAGNOSIS — L570 Actinic keratosis: Secondary | ICD-10-CM | POA: Diagnosis not present

## 2023-08-22 DIAGNOSIS — D225 Melanocytic nevi of trunk: Secondary | ICD-10-CM | POA: Diagnosis not present

## 2023-08-22 DIAGNOSIS — L500 Allergic urticaria: Secondary | ICD-10-CM | POA: Diagnosis not present

## 2023-08-22 DIAGNOSIS — D1801 Hemangioma of skin and subcutaneous tissue: Secondary | ICD-10-CM | POA: Diagnosis not present

## 2023-08-22 DIAGNOSIS — C61 Malignant neoplasm of prostate: Secondary | ICD-10-CM | POA: Diagnosis not present

## 2023-08-22 DIAGNOSIS — Z85828 Personal history of other malignant neoplasm of skin: Secondary | ICD-10-CM | POA: Diagnosis not present

## 2023-08-22 DIAGNOSIS — L821 Other seborrheic keratosis: Secondary | ICD-10-CM | POA: Diagnosis not present

## 2023-08-22 DIAGNOSIS — L910 Hypertrophic scar: Secondary | ICD-10-CM | POA: Diagnosis not present

## 2023-08-22 DIAGNOSIS — D485 Neoplasm of uncertain behavior of skin: Secondary | ICD-10-CM | POA: Diagnosis not present

## 2023-08-22 DIAGNOSIS — R350 Frequency of micturition: Secondary | ICD-10-CM | POA: Diagnosis not present

## 2023-08-31 ENCOUNTER — Ambulatory Visit (HOSPITAL_COMMUNITY)
Admission: RE | Admit: 2023-08-31 | Discharge: 2023-08-31 | Disposition: A | Discharge status: Home / Self Care | Source: Ambulatory Visit | Attending: Otolaryngology | Admitting: Otolaryngology

## 2023-08-31 DIAGNOSIS — K118 Other diseases of salivary glands: Secondary | ICD-10-CM

## 2023-08-31 DIAGNOSIS — C07 Malignant neoplasm of parotid gland: Secondary | ICD-10-CM | POA: Insufficient documentation

## 2023-08-31 DIAGNOSIS — R221 Localized swelling, mass and lump, neck: Secondary | ICD-10-CM | POA: Diagnosis not present

## 2023-08-31 MED ORDER — LIDOCAINE HCL 1 % IJ SOLN
INTRAMUSCULAR | Status: AC
Start: 2023-08-31 — End: 2023-08-31
  Filled 2023-08-31: qty 20

## 2023-08-31 NOTE — Procedures (Signed)
 Interventional Radiology Procedure:   Indications: Left parotid lesion  Procedure: US  guided FNA  Findings: Aspirated 2 ml of bloody fluid and additional FNAs from left parotid nodule  Complications: None     EBL: Minimal  Plan: Fluid sent for cytology and culture  Armya Westerhoff R. Philip, MD  Pager: [phone]

## 2023-09-01 LAB — CYTOLOGY - NON PAP

## 2023-09-04 DIAGNOSIS — C779 Secondary and unspecified malignant neoplasm of lymph node, unspecified: Secondary | ICD-10-CM | POA: Diagnosis not present

## 2023-09-04 DIAGNOSIS — C44300 Unspecified malignant neoplasm of skin of unspecified part of face: Secondary | ICD-10-CM | POA: Diagnosis not present

## 2023-09-04 NOTE — Progress Notes (Signed)
 Otolaryngology Office Note  HPI:    Charles Solis is a 73 y.o. male who presents as a new  Patient.   Referring Provider: No ref. provider found   Chief complaint: Metastatic lymph node.  HPI: He is of [place] descent and fair skinned and has had multiple skin cancers over the years.  The most recent skin cancer removal was Mohs surgery to the left pinna about 3 years ago.  A few months ago he developed a lump in the left jaw area.  It has grown in size.  Ultimately he had ultrasound and then CT scan and then fine-needle aspiration biopsy.  He was told he had a squamous cell carcinoma and needed surgical treatment.  He is here for second opinion.  Overall in good health.  No history of heart or pulmonary disease.  Remote history of smoking.  He has had a little bit of discomfort where the lump is but no neurologic symptoms related to that.  PMH/Meds/All/SocHx/FamHx/ROS:   Medical History[1]  Surgical History[2]  No family history of bleeding disorders, wound healing problems or difficulty with anesthesia.      Current Medications[3]  A complete ROS was performed with pertinent positives/negatives noted in the HPI. The remainder of the ROS are negative.    Physical Exam:    BP 133/89 (BP Location: Left arm, Patient Position: Sitting)   Pulse 94   Temp 97.7 F (36.5 C) (Temporal)   Ht 1.854 m (6' 1)   Wt 85.7 kg (189 lb)   BMI 24.94 kg/m    General:  Healthy and alert, in no distress, breathing easily. Normal affect. In a pleasant mood. Head: Normocephalic, atraumatic. No masses, or scars. Eyes: Pupils are equal, and reactive to light. Vision is grossly intact. No spontaneous or gaze nystagmus. Ears: Left pinna with a Mohs defect that is healed in approximately 40% of the posterior pinna with missing cartilage.  Adjacent along the helical edge are multiple firm nodules without any obvious ulceration.  Ear canals are clear. Tympanic membranes are intact, with normal landmarks  and the middle ears are clear and healthy. Hearing: Grossly normal. Nose: Nasal cavities are clear with healthy mucosa, no polyps or exudate. Airways are patent. Face: No masses or scars, facial nerve function is symmetric. Oral Cavity: No mucosal abnormalities are noted. Tongue with normal mobility. Dentition appears healthy. Oropharynx: Tonsils are symmetric. There are no mucosal masses identified. Tongue base appears normal and healthy. Larynx/Hypopharynx: deferred Chest: Deferred Neck: There is a conglomerate of firm nodes with a superficial aspect that is in the subcutaneous tissue about the angle of the mandible and then a deeper much larger mass that extends approximately 4 cm.  There is no other palpable lymphadenopathy or masses.  There are no thyroid  nodules or enlargement. Neuro: Cranial nerves II-XII with normal function. Balance: Normal gate. Other findings: none.   Independent Review of Additional Tests or Records:  CT neck:  Centrally lucent irregular nodule within or abutting the posterior aspect of the superficial lobe of the left parotid gland.  Considerations primary include carotid neoplasm and suppurative or necrotic lymph node.  Ultrasound-guided biopsy is suggested if not previously performed.  FNA  Malignant cells present, carcinoma with squamous differentiation, no lymphoid tissue identified.  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  Recommend parotidectomy with facial nerve dissection.  Currently there is no evidence of facial nerve dysfunction but we talked about  the possibility of either temporary or permanent facial nerve paralysis depending on the relationship between the tumor and the nerve branches.  It is possible that a total parotidectomy may be necessary.  I would also recommend a partial neck dissection involving levels 2 and 3.  Also recommend a wedge resection of the pinna  where the abnormal nodules are to make sure there is not still a source.  He will almost certainly require postop radiation.  Postop adjuvant chemo may also be necessary depending on the pathology.  We will need to get a PET scan to evaluate for distant disease as well.  He understands all of this and agrees.   Medical Decision Making: #/Compl Problems  4     Data Rev  4  Management 4  (1-Straightforward, 2-Low, 3- Moderate, 4-High)  Ida VEAR Loader, MD        [1] Past Medical History: Diagnosis Date  . High blood pressure   . High cholesterol   [2] History reviewed. No pertinent surgical history. [3]  Current Outpatient Medications:  .  alfuzosin HCl (UROXATRAL ORAL), , Disp: , Rfl:  .  ezetimibe  (ZETIA ) 10 mg tablet, Take 10 mg by mouth daily., Disp: , Rfl:  .  finasteride  (PROSCAR ) 5 mg tablet, Take 5 mg by mouth daily., Disp: , Rfl:  .  ipratropium (ATROVENT ) 21 mcg (0.03 %) nasal spray, 2 sprays., Disp: , Rfl:  .  rosuvastatin  (CRESTOR ) 20 mg tablet, Take 20 mg by mouth daily., Disp: , Rfl:  .  silodosin (RAPAFLO) 8 mg cap capsule, Take 8 mg by mouth daily., Disp: , Rfl:  .  tadalafil  (CIALIS  ORAL), , Disp: , Rfl:

## 2023-09-04 NOTE — H&P (View-Only) (Signed)
 Otolaryngology Office Note  HPI:    Charles Solis is a 73 y.o. male who presents as a new  Patient.   Referring Provider: No ref. provider found   Chief complaint: Metastatic lymph node.  HPI: He is of [place] descent and fair skinned and has had multiple skin cancers over the years.  The most recent skin cancer removal was Mohs surgery to the left pinna about 3 years ago.  A few months ago he developed a lump in the left jaw area.  It has grown in size.  Ultimately he had ultrasound and then CT scan and then fine-needle aspiration biopsy.  He was told he had a squamous cell carcinoma and needed surgical treatment.  He is here for second opinion.  Overall in good health.  No history of heart or pulmonary disease.  Remote history of smoking.  He has had a little bit of discomfort where the lump is but no neurologic symptoms related to that.  PMH/Meds/All/SocHx/FamHx/ROS:   Medical History[1]  Surgical History[2]  No family history of bleeding disorders, wound healing problems or difficulty with anesthesia.      Current Medications[3]  A complete ROS was performed with pertinent positives/negatives noted in the HPI. The remainder of the ROS are negative.    Physical Exam:    BP 133/89 (BP Location: Left arm, Patient Position: Sitting)   Pulse 94   Temp 97.7 F (36.5 C) (Temporal)   Ht 1.854 m (6' 1)   Wt 85.7 kg (189 lb)   BMI 24.94 kg/m    General:  Healthy and alert, in no distress, breathing easily. Normal affect. In a pleasant mood. Head: Normocephalic, atraumatic. No masses, or scars. Eyes: Pupils are equal, and reactive to light. Vision is grossly intact. No spontaneous or gaze nystagmus. Ears: Left pinna with a Mohs defect that is healed in approximately 40% of the posterior pinna with missing cartilage.  Adjacent along the helical edge are multiple firm nodules without any obvious ulceration.  Ear canals are clear. Tympanic membranes are intact, with normal landmarks  and the middle ears are clear and healthy. Hearing: Grossly normal. Nose: Nasal cavities are clear with healthy mucosa, no polyps or exudate. Airways are patent. Face: No masses or scars, facial nerve function is symmetric. Oral Cavity: No mucosal abnormalities are noted. Tongue with normal mobility. Dentition appears healthy. Oropharynx: Tonsils are symmetric. There are no mucosal masses identified. Tongue base appears normal and healthy. Larynx/Hypopharynx: deferred Chest: Deferred Neck: There is a conglomerate of firm nodes with a superficial aspect that is in the subcutaneous tissue about the angle of the mandible and then a deeper much larger mass that extends approximately 4 cm.  There is no other palpable lymphadenopathy or masses.  There are no thyroid  nodules or enlargement. Neuro: Cranial nerves II-XII with normal function. Balance: Normal gate. Other findings: none.   Independent Review of Additional Tests or Records:  CT neck:  Centrally lucent irregular nodule within or abutting the posterior aspect of the superficial lobe of the left parotid gland.  Considerations primary include carotid neoplasm and suppurative or necrotic lymph node.  Ultrasound-guided biopsy is suggested if not previously performed.  FNA  Malignant cells present, carcinoma with squamous differentiation, no lymphoid tissue identified.  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  Recommend parotidectomy with facial nerve dissection.  Currently there is no evidence of facial nerve dysfunction but we talked about  the possibility of either temporary or permanent facial nerve paralysis depending on the relationship between the tumor and the nerve branches.  It is possible that a total parotidectomy may be necessary.  I would also recommend a partial neck dissection involving levels 2 and 3.  Also recommend a wedge resection of the pinna  where the abnormal nodules are to make sure there is not still a source.  He will almost certainly require postop radiation.  Postop adjuvant chemo may also be necessary depending on the pathology.  We will need to get a PET scan to evaluate for distant disease as well.  He understands all of this and agrees.   Medical Decision Making: #/Compl Problems  4     Data Rev  4  Management 4  (1-Straightforward, 2-Low, 3- Moderate, 4-High)  Ida VEAR Loader, MD        [1] Past Medical History: Diagnosis Date  . High blood pressure   . High cholesterol   [2] History reviewed. No pertinent surgical history. [3]  Current Outpatient Medications:  .  alfuzosin HCl (UROXATRAL ORAL), , Disp: , Rfl:  .  ezetimibe  (ZETIA ) 10 mg tablet, Take 10 mg by mouth daily., Disp: , Rfl:  .  finasteride  (PROSCAR ) 5 mg tablet, Take 5 mg by mouth daily., Disp: , Rfl:  .  ipratropium (ATROVENT ) 21 mcg (0.03 %) nasal spray, 2 sprays., Disp: , Rfl:  .  rosuvastatin  (CRESTOR ) 20 mg tablet, Take 20 mg by mouth daily., Disp: , Rfl:  .  silodosin (RAPAFLO) 8 mg cap capsule, Take 8 mg by mouth daily., Disp: , Rfl:  .  tadalafil  (CIALIS  ORAL), , Disp: , Rfl:

## 2023-09-05 LAB — AEROBIC/ANAEROBIC CULTURE W GRAM STAIN (SURGICAL/DEEP WOUND): Culture: NO GROWTH

## 2023-09-05 NOTE — Progress Notes (Deleted)
 Cardiology Office Note:  .   Date:  09/05/2023  ID:  [id number] KEM, DOB [birth year], MRN [id number] PCP: Judyth Isaiah Bottcher, DO  [facility] Providers Cardiologist:  Aleene Passe, MD    Patient Profile: .      PMH Hyperlipidemia LDL particle number of 1229 in 2018 >> improved to 834 10 months later Apolipoprotein B =  71 on 12/21/2016 Hypertension Family history CAD  Father had MI and CABG age 73 CT calcium  score 09/29/2014 of 50 (48th percentile) Hypothyroidism  Referred to cardiology for management of hyperlipidemia. Family history of CAD. Seen on 11/23/2021 by Dr. Passe at which time LDL cholesterol was 50.  Blood pressure was well-controlled and he had no specific cardiac symptoms. Advised to return in 1 year for follow-up.  Seen by me on 08/24/22 for for evaluation of shortness of breath and fatigue for approximately 3 months. Is napping more frequently, unusual for him. Does elliptical 5 days per week starting at resistance of 9 and gradually reduces resistance to reduce stress on his back. Can go for 15-20 minutes at resistance of 5-6 without significant shortness of breath. No chest pain, palpitations, orthopnea, PND, presyncope, or syncope. Symptoms of SOB and fatigue more with heavy lifting. Was not aware that HR was elevated today. Typical HR in low to mid 80s bpm. Frequent diet Coke and tea intake, but limits it especially in the afternoon. Has upcoming stressful trial that has been pending for 3 years, sometimes wakes him up at night but no increased stress recently. No recent changes in medical history or trauma. No abnormalities on labs. Cardiac monitor revealed predominant NSR with average HR 100 bpm, 11 episodes SVT with longest lasting 8.3 sec, no a fib or concerning arrhythmias.   2D echo was obtained 08/25/2022 and revealed normal LVEF 55-60%, G1 DD, normal RV, no significant valve disease. Dr. Alvaro, urologist, had recently changed him to silodosin to alleviate  dizziness on a different alpha blocker previously prescribed. He felt less dizzy until starting metoprolol . Has visual changes that are prodrome for migraine, but these have been present for a while. No chest pain, edema, orthopnea, PND, palpitations, presyncope, or syncope. Metoprolol  was added for management of tachycardia and he developed orthostasis. We discontinued hydrochlorothiazide . BP and heart rate improved and when he saw Dr. Passe on 12/20/22, vitals were stable and no concerning cardiac symptoms.         History of Present Illness: .   Charles Solis is a very pleasant 73 y.o. male who presents for follow-up.   Coronary CTA  ROS: ***       Studies Reviewed: .         Risk Assessment/Calculations:     No BP recorded.  {Refresh Note OR Click here to enter BP  :1}***       Physical Exam:   VS:  There were no vitals taken for this visit.   Wt Readings from Last 3 Encounters:  08/11/23 185 lb (83.9 kg)  05/22/23 185 lb (83.9 kg)  12/20/22 188 lb (85.3 kg)    GEN: Well nourished, well developed in no acute distress NECK: No JVD; No carotid bruits CARDIAC: tachy rate, regular rhythm, no murmurs, rubs, gallops RESPIRATORY:  Clear to auscultation without rales, wheezing or rhonchi  ABDOMEN: Soft, non-tender, non-distended EXTREMITIES:  No edema; No deformity     ASSESSMENT AND PLAN: .    Shortness of breath and fatigue: Presented 08/24/22 with 3 month history of fatigue  and dyspnea on exertion, most notable when he is moving or carrying something heavy. Found to have sinus tachycardia with HR 122 bpm on EKG. No clear indication for ST, including negative D-dimer, normal TSH, no electrolyte abnormality, no abnormality on CBC, and normal liver and kidney function. Today, he reports no change in symptoms of fatigue and DOE. Since starting metoprolol , he is having dizziness. Case reviewed with primary cardiologist, Dr. Alveta.  Recommendation to d/c hydrochlorothiazide  and  potassium supplement, improve hydration and increase electrolytes to improve BP and decrease dizziness.  Resume exercise and notify us  if symptoms worsen.  Continue to monitor HR and BP and report in 2 to 3 days.  Sinus tachycardia: EKG today reveals sinus rhythm at 91 bpm. He is taking metoprolol  25 mg twice daily. As noted below, he is having dizziness. We will continue this dose and discontinue HCTZ with hopes to improve BP.  CAD without angina: CT calcium  score 09/2014 with coronary calcium  score of 50 (48th percentile). He denies chest pain. Is having DOE and fatigue as described above. Encouraged him to resume elliptical work outs to see if symptoms worsen. Consider further ischemic evaluation if symptoms persist without specific cause.   Orthostatic hypotension/History of hypertension: He is having orthostatic hypotension since starting metoprolol .  We will have him discontinue hydrochlorothiazide  and potassium supplement. Continue to monitor BP and notify us  if there is no improvement. We have asked him to send a log of BP and HR in the next few days.   Hyperlipidemia LDL goal < 70: LDL 61 on 11/16/20. Need to repeat lipid panel at next visit. Continue ezetimibe  and rosuvastatin .       Dispo: ***  Signed, Rosaline Bane, NP-C

## 2023-09-08 NOTE — Progress Notes (Signed)
 Surgical Instructions   Your procedure is scheduled on September 13, 2023. Report to [facility] Main Entrance A at 10:15 A.M., then check in with the Admitting office. Any questions or running late day of surgery: call [phone]  Questions prior to your surgery date: call [phone], Monday-Friday, 8am-4pm. If you experience any cold or flu symptoms such as cough, fever, chills, shortness of breath, etc. between now and your scheduled surgery, please notify us  at the above number.     Remember:  Do not eat or drink  after midnight the night before your surgery       Take these medicines the morning of surgery with A SIP OF WATER  ezetimibe  (ZETIA )  finasteride (PROSCAR)  metoprolol  tartrate (LOPRESSOR )  rosuvastatin  (CRESTOR )  silodosin (RAPAFLO)   May take these medicines IF NEEDED: ipratropium (ATROVENT) nasal spray    One week prior to surgery, STOP taking any Aspirin (unless otherwise instructed by your surgeon) Aleve, Naproxen, Ibuprofen, Motrin, Advil, Goody's, BC's, all herbal medications, fish oil, and non-prescription vitamins.                     Do NOT Smoke (Tobacco/Vaping) for 24 hours prior to your procedure.  If you use a CPAP at night, you may bring your mask/headgear for your overnight stay.   You will be asked to remove any contacts, glasses, piercing's, hearing aid's, dentures/partials prior to surgery. Please bring cases for these items if needed.    Patients discharged the day of surgery will not be allowed to drive home, and someone needs to stay with them for 24 hours.  SURGICAL WAITING ROOM VISITATION Patients may have no more than 2 support people in the waiting area - these visitors may rotate.   Pre-op nurse will coordinate an appropriate time for 1 ADULT support person, who may not rotate, to accompany patient in pre-op.  Children under the age of 11 must have an adult with them who is not the patient and must remain in the main waiting area  with an adult.  If the patient needs to stay at the hospital during part of their recovery, the visitor guidelines for inpatient rooms apply.  Please refer to the [facility] website for the visitor guidelines for any additional information.   If you received a COVID test during your pre-op visit  it is requested that you wear a mask when out in public, stay away from anyone that may not be feeling well and notify your surgeon if you develop symptoms. If you have been in contact with anyone that has tested positive in the last 10 days please notify you surgeon.      Pre-operative CHG Bathing Instructions   You can play a key role in reducing the risk of infection after surgery. Your skin needs to be as free of germs as possible. You can reduce the number of germs on your skin by washing with CHG (chlorhexidine gluconate) soap before surgery. CHG is an antiseptic soap that kills germs and continues to kill germs even after washing.   DO NOT use if you have an allergy to chlorhexidine/CHG or antibacterial soaps. If your skin becomes reddened or irritated, stop using the CHG and notify one of our RNs at [phone].              TAKE A SHOWER THE NIGHT BEFORE SURGERY AND THE DAY OF SURGERY    Please keep in mind the following:  DO NOT shave, including  legs and underarms, 48 hours prior to surgery.   You may shave your face before/day of surgery.  Place clean sheets on your bed the night before surgery Use a clean washcloth (not used since being washed) for each shower. DO NOT sleep with pet's night before surgery.  CHG Shower Instructions:  Wash your face and private area with normal soap. If you choose to wash your hair, wash first with your normal shampoo.  After you use shampoo/soap, rinse your hair and body thoroughly to remove shampoo/soap residue.  Turn the water OFF and apply half the bottle of CHG soap to a CLEAN washcloth.  Apply CHG soap ONLY FROM YOUR NECK DOWN TO YOUR TOES  (washing for 3-5 minutes)  DO NOT use CHG soap on face, private areas, open wounds, or sores.  Pay special attention to the area where your surgery is being performed.  If you are having back surgery, having someone wash your back for you may be helpful. Wait 2 minutes after CHG soap is applied, then you may rinse off the CHG soap.  Pat dry with a clean towel  Put on clean pajamas    Additional instructions for the day of surgery: DO NOT APPLY any lotions, deodorants, cologne, or perfumes.   Do not wear jewelry or makeup Do not wear nail polish, gel polish, artificial nails, or any other type of covering on natural nails (fingers and toes) Do not bring valuables to the hospital. [facility] is not responsible for valuables/personal belongings. Put on clean/comfortable clothes.  Please brush your teeth.  Ask your nurse before applying any prescription medications to the skin.

## 2023-09-11 ENCOUNTER — Encounter (HOSPITAL_COMMUNITY)
Admission: RE | Admit: 2023-09-11 | Discharge: 2023-09-11 | Disposition: A | Discharge status: Home / Self Care | Source: Ambulatory Visit | Attending: Otolaryngology

## 2023-09-11 ENCOUNTER — Other Ambulatory Visit: Payer: Self-pay

## 2023-09-11 ENCOUNTER — Encounter (HOSPITAL_COMMUNITY): Payer: Self-pay

## 2023-09-11 ENCOUNTER — Ambulatory Visit (HOSPITAL_BASED_OUTPATIENT_CLINIC_OR_DEPARTMENT_OTHER): Admitting: Nurse Practitioner

## 2023-09-11 VITALS — BP 131/83 | HR 80 | Temp 98.2°F | Resp 17 | Ht 73.0 in | Wt 192.3 lb

## 2023-09-11 DIAGNOSIS — C779 Secondary and unspecified malignant neoplasm of lymph node, unspecified: Secondary | ICD-10-CM | POA: Diagnosis not present

## 2023-09-11 DIAGNOSIS — C770 Secondary and unspecified malignant neoplasm of lymph nodes of head, face and neck: Secondary | ICD-10-CM | POA: Diagnosis not present

## 2023-09-11 DIAGNOSIS — Z87891 Personal history of nicotine dependence: Secondary | ICD-10-CM | POA: Insufficient documentation

## 2023-09-11 DIAGNOSIS — E785 Hyperlipidemia, unspecified: Secondary | ICD-10-CM | POA: Insufficient documentation

## 2023-09-11 DIAGNOSIS — L905 Scar conditions and fibrosis of skin: Secondary | ICD-10-CM | POA: Diagnosis not present

## 2023-09-11 DIAGNOSIS — E039 Hypothyroidism, unspecified: Secondary | ICD-10-CM | POA: Insufficient documentation

## 2023-09-11 DIAGNOSIS — Z79899 Other long term (current) drug therapy: Secondary | ICD-10-CM | POA: Diagnosis not present

## 2023-09-11 DIAGNOSIS — E7800 Pure hypercholesterolemia, unspecified: Secondary | ICD-10-CM | POA: Diagnosis not present

## 2023-09-11 DIAGNOSIS — C44300 Unspecified malignant neoplasm of skin of unspecified part of face: Secondary | ICD-10-CM | POA: Insufficient documentation

## 2023-09-11 DIAGNOSIS — Z8546 Personal history of malignant neoplasm of prostate: Secondary | ICD-10-CM | POA: Insufficient documentation

## 2023-09-11 DIAGNOSIS — C07 Malignant neoplasm of parotid gland: Secondary | ICD-10-CM | POA: Diagnosis not present

## 2023-09-11 DIAGNOSIS — C44209 Unspecified malignant neoplasm of skin of left ear and external auricular canal: Secondary | ICD-10-CM | POA: Diagnosis not present

## 2023-09-11 DIAGNOSIS — Z01818 Encounter for other preprocedural examination: Secondary | ICD-10-CM | POA: Insufficient documentation

## 2023-09-11 LAB — CBC
HCT: 41.4 % (ref 39.0–52.0)
Hemoglobin: 13.9 g/dL (ref 13.0–17.0)
MCH: 30 pg (ref 26.0–34.0)
MCHC: 33.6 g/dL (ref 30.0–36.0)
MCV: 89.4 fL (ref 80.0–100.0)
Platelets: 208 K/uL (ref 150–400)
RBC: 4.63 MIL/uL (ref 4.22–5.81)
RDW: 13.2 % (ref 11.5–15.5)
WBC: 7.6 K/uL (ref 4.0–10.5)
nRBC: 0 % (ref 0.0–0.2)

## 2023-09-11 LAB — BASIC METABOLIC PANEL WITH GFR
Anion gap: 8 (ref 5–15)
BUN: 19 mg/dL (ref 8–23)
CO2: 24 mmol/L (ref 22–32)
Calcium: 9.2 mg/dL (ref 8.9–10.3)
Chloride: 108 mmol/L (ref 98–111)
Creatinine, Ser: 0.91 mg/dL (ref 0.61–1.24)
GFR, Estimated: >60 mL/min (ref >60)
Glucose, Bld: 95 mg/dL (ref 70–99)
Potassium: 3.8 mmol/L (ref 3.5–5.1)
Sodium: 140 mmol/L (ref 135–145)

## 2023-09-11 NOTE — Progress Notes (Signed)
 PCP -Isaiah Bottcher Combs DO Cardiologist - Aleene Passe MD, Lonni Delcie COME  PPM/ICD - denies Device Orders -  Rep Notified -   Chest x-ray - na EKG - 09/11/23 Stress Test - denies ECHO - 08/25/22 Cardiac Cath - denies  Sleep Study - denies CPAP -   Fasting Blood Sugar - na Checks Blood Sugar _____ times a day  Last dose of GLP1 agonist-  na GLP1 instructions:   Blood Thinner Instructions:na Aspirin Instructions:na  ERAS Protcol -no PRE-SURGERY Ensure or G2-   COVID TEST- na   Anesthesia review: yes- CAD,HTN.Pt had appointment scheduled for today with Rosaline Bane NP in cardiology office for complaints of fatigue,DOE,dizziness. Appointment was made on 08/17/23,but at PAT appointment pt says he has canceled the appointment with Rosaline Bane because he feels his symptoms have improved.   Patient denies shortness of breath, fever, cough,dizziness, and chest pain at PAT appointment   All instructions explained to the patient, with a verbal understanding of the material. Patient agrees to go over the instructions while at home for a better understanding. The opportunity to ask questions was provided.

## 2023-09-12 ENCOUNTER — Encounter (HOSPITAL_COMMUNITY): Payer: Self-pay | Admitting: Otolaryngology

## 2023-09-12 NOTE — Progress Notes (Signed)
 Anesthesia Chart Review:   Case: [id number] Date/Time: 09/13/23 0815   Procedures:      DISSECTION, NECK, RADICAL (Left)     EXCISION, PAROTID GLAND (Left)     OTOPLASTY (Left) - Left wedge resection of pinna with primary closure.   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  History includes former smoker, prostate cancer, hypothyroidism, skin cancer (multiple excisions, including Mohs surgery left pinna ~ 2022). Recently he noted an enlarged mass in his left submandibular region. CT suggested a parotid neoplasm or necrotic lymph node. 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 Above procedure recommended.  He has been followed by cardiologist Dr. Alveta for HLD since 2016. CAC score then was 50 (48th percentile). Last visit was on 12/20/22. He was still working and exercising. Denied CP and SOB. July 2024 monitor showed dominant sinus rhythm with 11 episodes of SVT, the fastest and longest episode lasting 8.3 seconds with max HR of 207 bpm. July 2024 TTE showed LVEF 85 to 60%, no regional wall motion abnormalities, grade 1 diastolic dysfunction, normal RV systolic function normal PASP, estimated RVSP 26.4 mmHg, trivial MR. He had been started on metoprolol  but had orthostasis, fatigue and dizziness after started, but improved after hydrochlorothiazide  discontinued.  On Crestor  and Zetia  for HLD.  No changes made.  1 year follow-up plan with Dr. Kate following Dr. Allena retirement. Of note, he had some recurrent dizziness and fatigue ~ a month ago, but symptoms resolved, so he did not feel he needed to push up his  12/2023 follow-up date.   Anesthesia team to evaluate on the day of surgery.   VS: BP 131/83   Pulse 80   Temp 36.8 C   Resp 17   Ht 6' 1 (1.854 m)   Wt 87.2 kg   SpO2 99%   BMI 25.37 kg/m    PROVIDERS: Pcp, No Alveta Mungo, MD -> Kate Bruckner, MD is cardiologist (next visit 12/21/23)   LABS: Labs reviewed: Acceptable for surgery. (all labs ordered are listed, but only abnormal results are displayed)  Labs Reviewed  BASIC METABOLIC PANEL WITH GFR  CBC     IMAGES: CT Soft Tissue Neck 07/18/23: IMPRESSION: 1. Centrally lucent irregular nodule within or abutting the posterior aspect of the superficial lobe of the left parotid gland. Considerations primary include parotid neoplasm and suppurative or necrotic lymph node. Ultrasound-guided biopsy is suggested if not previously performed.   EKG: 09/11/23: NSR   CV: Echo 08/25/22: IMPRESSIONS   1. Left ventricular ejection fraction, by estimation, is 55 to 60%. The  left ventricle has normal function. The left ventricle has no regional  wall motion abnormalities. Left ventricular diastolic parameters are  consistent with Grade I diastolic  dysfunction (impaired relaxation).   2. Right ventricular systolic function is normal. The right ventricular  size is normal. There is normal pulmonary artery systolic pressure. The  estimated right ventricular systolic pressure is 26.4 mmHg.   3. The mitral valve is normal in structure. Trivial mitral valve  regurgitation. No evidence of mitral stenosis.   4. The aortic valve is tricuspid. Aortic valve regurgitation is not  visualized. No aortic stenosis is present.   5. The inferior vena cava is normal in size with greater than 50%  respiratory variability, suggesting right atrial pressure of 3 mmHg.    Long term monitor 08/24/22 - 08/27/22:   Predominate rhythm is sinus rhythm   He had 11 episodes of supraventricular tachycardia.  the fastest and longest episode  lasted 8.3 seconds with a max HR of 207 and an avg HR of 171.   Rare PACs, rare PVCs   CT Cardiac Calcium  Scoring 09/29/14: IMPRESSION: Coronary calcium  score of 50. This was 48 percentile for age and sex matched control.     Past Medical History:  Diagnosis Date   Hypothyroidism    Prostate cancer (HCC)     Past Surgical History:  Procedure Laterality Date   HERNIA REPAIR     inguinal   MOHS SURGERY     head,ear,cheek,nose   TONSILLECTOMY      MEDICATIONS:  cyanocobalamin  (VITAMIN B12) 1000 MCG tablet   ezetimibe  (ZETIA ) 10 MG tablet   finasteride  (PROSCAR ) 5 MG tablet   ipratropium (ATROVENT ) 0.03 % nasal spray   metoprolol  tartrate (LOPRESSOR ) 25 MG tablet   rosuvastatin  (CRESTOR ) 20 MG tablet   Saw Palmetto  500 MG CAPS   silodosin (RAPAFLO) 8 MG CAPS capsule   tadalafil  (CIALIS ) 5 MG tablet   No current facility-administered medications for this encounter.    Isaiah Ruder, PA-C Surgical Short Stay/Anesthesiology [facility] Phone [phone] [facility] Phone [phone] 09/12/2023 11:56 AM

## 2023-09-12 NOTE — Progress Notes (Signed)
 Patient called and him to be here at 0630 for his 0830 surgery time.

## 2023-09-12 NOTE — Progress Notes (Signed)
 Called and informed patient to be here at 0530 for his 0830 surgery time.

## 2023-09-12 NOTE — Anesthesia Preprocedure Evaluation (Addendum)
 Anesthesia Evaluation  Patient identified by MRN, date of birth, ID band Patient awake    Reviewed: Allergy & Precautions, NPO status , Patient's Chart, lab work & pertinent test results  Airway Mallampati: II       Dental no notable dental hx. (+) Teeth Intact, Caps, Dental Advisory Given   Pulmonary former smoker   Pulmonary exam normal breath sounds clear to auscultation       Cardiovascular hypertension, Normal cardiovascular exam+ dysrhythmias Supra Ventricular Tachycardia  Rhythm:Regular Rate:Normal  EKG 09/11/23 NSR, Normal  Echo 08/25/22 1. Left ventricular ejection fraction, by estimation, is 55 to 60%. The  left ventricle has normal function. The left ventricle has no regional  wall motion abnormalities. Left ventricular diastolic parameters are  consistent with Grade I diastolic  dysfunction (impaired relaxation).   2. Right ventricular systolic function is normal. The right ventricular  size is normal. There is normal pulmonary artery systolic pressure. The  estimated right ventricular systolic pressure is 26.4 mmHg.   3. The mitral valve is normal in structure. Trivial mitral valve  regurgitation. No evidence of mitral stenosis.   4. The aortic valve is tricuspid. Aortic valve regurgitation is not  visualized. No aortic stenosis is present.   5. The inferior vena cava is normal in size with greater than 50%  respiratory variability, suggesting right atrial pressure of 3 mmHg.     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   Endo/Other  Hypothyroidism  HLD  Renal/GU negative Renal ROS   Hx/o Prostate Ca    Musculoskeletal Skin Ca face   Abdominal   Peds  Hematology Metastatic disease to Lymph nodes   Anesthesia Other Findings   Reproductive/Obstetrics                              Anesthesia  Physical Anesthesia Plan  ASA: 2  Anesthesia Plan: General   Post-op Pain Management: Dilaudid  IV, Ofirmev  IV (intra-op)* and Precedex   Induction: Intravenous  PONV Risk Score and Plan: 4 or greater and Treatment may vary due to age or medical condition, Ondansetron  and Dexamethasone   Airway Management Planned: Oral ETT  Additional Equipment: None  Intra-op Plan:   Post-operative Plan: Extubation in OR  Informed Consent: I have reviewed the patients History and Physical, chart, labs and discussed the procedure including the risks, benefits and alternatives for the proposed anesthesia with the patient or authorized representative who has indicated his/her understanding and acceptance.     Dental advisory given  Plan Discussed with: Anesthesiologist and CRNA  Anesthesia Plan Comments: (PAT note written 09/12/2023 by Allison Zelenak, PA-C.  )         Anesthesia Quick Evaluation

## 2023-09-13 ENCOUNTER — Encounter (HOSPITAL_COMMUNITY): Payer: Self-pay | Admitting: Otolaryngology

## 2023-09-13 ENCOUNTER — Inpatient Hospital Stay (HOSPITAL_COMMUNITY): Payer: Self-pay | Admitting: Certified Registered Nurse Anesthetist

## 2023-09-13 ENCOUNTER — Encounter (HOSPITAL_COMMUNITY)
Admission: RE | Disposition: A | Discharge status: Home / Self Care | Payer: Self-pay | Source: Home / Self Care | Attending: Otolaryngology

## 2023-09-13 ENCOUNTER — Inpatient Hospital Stay (HOSPITAL_COMMUNITY)
Admission: RE | Admit: 2023-09-13 | Discharge: 2023-09-14 | DRG: 822 | Disposition: A | Discharge status: Home / Self Care | Attending: Otolaryngology | Admitting: Otolaryngology

## 2023-09-13 ENCOUNTER — Other Ambulatory Visit: Payer: Self-pay

## 2023-09-13 DIAGNOSIS — Z8546 Personal history of malignant neoplasm of prostate: Secondary | ICD-10-CM

## 2023-09-13 DIAGNOSIS — C779 Secondary and unspecified malignant neoplasm of lymph node, unspecified: Secondary | ICD-10-CM | POA: Diagnosis not present

## 2023-09-13 DIAGNOSIS — C770 Secondary and unspecified malignant neoplasm of lymph nodes of head, face and neck: Secondary | ICD-10-CM | POA: Diagnosis present

## 2023-09-13 DIAGNOSIS — E7800 Pure hypercholesterolemia, unspecified: Secondary | ICD-10-CM | POA: Diagnosis present

## 2023-09-13 DIAGNOSIS — C4490 Unspecified malignant neoplasm of skin, unspecified: Secondary | ICD-10-CM | POA: Diagnosis present

## 2023-09-13 DIAGNOSIS — C07 Malignant neoplasm of parotid gland: Secondary | ICD-10-CM | POA: Diagnosis not present

## 2023-09-13 DIAGNOSIS — C44300 Unspecified malignant neoplasm of skin of unspecified part of face: Secondary | ICD-10-CM

## 2023-09-13 DIAGNOSIS — L905 Scar conditions and fibrosis of skin: Secondary | ICD-10-CM | POA: Diagnosis not present

## 2023-09-13 DIAGNOSIS — Z79899 Other long term (current) drug therapy: Secondary | ICD-10-CM | POA: Diagnosis not present

## 2023-09-13 DIAGNOSIS — E039 Hypothyroidism, unspecified: Secondary | ICD-10-CM | POA: Diagnosis present

## 2023-09-13 DIAGNOSIS — C44209 Unspecified malignant neoplasm of skin of left ear and external auricular canal: Secondary | ICD-10-CM | POA: Diagnosis present

## 2023-09-13 DIAGNOSIS — C799 Secondary malignant neoplasm of unspecified site: Principal | ICD-10-CM | POA: Diagnosis present

## 2023-09-13 DIAGNOSIS — Z87891 Personal history of nicotine dependence: Secondary | ICD-10-CM

## 2023-09-13 HISTORY — PX: PAROTIDECTOMY: SHX2163

## 2023-09-13 HISTORY — PX: OTOPLASATY: SHX1485

## 2023-09-13 HISTORY — PX: RADICAL NECK DISSECTION: SHX2284

## 2023-09-13 SURGERY — DISSECTION, NECK, RADICAL
Anesthesia: General | Laterality: Left

## 2023-09-13 MED ORDER — SAW PALMETTO 500 MG PO CAPS: 1.0000 | ORAL_CAPSULE | Freq: Three times a day (TID) | ORAL | Status: DC

## 2023-09-13 MED ORDER — LACTATED RINGERS IV SOLN: INTRAVENOUS | Status: DC

## 2023-09-13 MED ORDER — ROSUVASTATIN CALCIUM 20 MG PO TABS
20.0000 mg | ORAL_TABLET | Freq: Every day | ORAL | Status: DC
  Administered 2023-09-13: 20 mg via ORAL
  Filled 2023-09-13: qty 1

## 2023-09-13 MED ORDER — IBUPROFEN 100 MG/5ML PO SUSP: 400.0000 mg | Freq: Four times a day (QID) | ORAL | Status: DC | PRN

## 2023-09-13 MED ORDER — MIDAZOLAM HCL 2 MG/2ML IJ SOLN
INTRAMUSCULAR | Status: DC | PRN
  Administered 2023-09-13: 2 mg via INTRAVENOUS

## 2023-09-13 MED ORDER — HYDROCODONE-ACETAMINOPHEN 5-325 MG PO TABS
1.0000 | ORAL_TABLET | ORAL | Status: DC | PRN
  Administered 2023-09-13 (×2): 2 via ORAL
  Filled 2023-09-13 (×2): qty 2

## 2023-09-13 MED ORDER — DROPERIDOL 2.5 MG/ML IJ SOLN: 0.6250 mg | Freq: Once | INTRAMUSCULAR | Status: DC | PRN

## 2023-09-13 MED ORDER — EPHEDRINE SULFATE-NACL 50-0.9 MG/10ML-% IV SOSY
PREFILLED_SYRINGE | INTRAVENOUS | Status: DC | PRN
  Administered 2023-09-13: 2.5 mg via INTRAVENOUS

## 2023-09-13 MED ORDER — PROPOFOL 10 MG/ML IV BOLUS
INTRAVENOUS | Status: AC
  Filled 2023-09-13: qty 20

## 2023-09-13 MED ORDER — ONDANSETRON HCL 4 MG/2ML IJ SOLN: 4.0000 mg | INTRAMUSCULAR | Status: DC | PRN

## 2023-09-13 MED ORDER — SUGAMMADEX SODIUM 200 MG/2ML IV SOLN
INTRAVENOUS | Status: AC
  Filled 2023-09-13: qty 2

## 2023-09-13 MED ORDER — BACITRACIN ZINC 500 UNIT/GM EX OINT
TOPICAL_OINTMENT | CUTANEOUS | Status: AC
Start: 2023-09-13 — End: 2023-09-13
  Filled 2023-09-13: qty 56.7

## 2023-09-13 MED ORDER — CHLORHEXIDINE GLUCONATE 0.12 % MT SOLN
15.0000 mL | Freq: Once | OROMUCOSAL | Status: AC
  Administered 2023-09-13: 15 mL via OROMUCOSAL
  Filled 2023-09-13: qty 15

## 2023-09-13 MED ORDER — PROPOFOL 500 MG/50ML IV EMUL
INTRAVENOUS | Status: DC | PRN
Start: 2023-09-13 — End: 2023-09-13
  Administered 2023-09-13: 50 ug/kg/min via INTRAVENOUS

## 2023-09-13 MED ORDER — ACETAMINOPHEN 10 MG/ML IV SOLN
INTRAVENOUS | Status: AC
  Filled 2023-09-13: qty 100

## 2023-09-13 MED ORDER — ALBUMIN HUMAN 5 % IV SOLN: INTRAVENOUS | Status: DC | PRN

## 2023-09-13 MED ORDER — LIDOCAINE-EPINEPHRINE 1 %-1:100000 IJ SOLN
INTRAMUSCULAR | Status: AC
  Filled 2023-09-13: qty 1

## 2023-09-13 MED ORDER — HYDROMORPHONE HCL 1 MG/ML IJ SOLN: 0.2500 mg | INTRAMUSCULAR | Status: DC | PRN

## 2023-09-13 MED ORDER — ROCURONIUM BROMIDE 10 MG/ML (PF) SYRINGE
PREFILLED_SYRINGE | INTRAVENOUS | Status: AC
Start: 2023-09-13 — End: 2023-09-13
  Filled 2023-09-13: qty 30

## 2023-09-13 MED ORDER — ONDANSETRON HCL 4 MG PO TABS: 4.0000 mg | ORAL_TABLET | ORAL | Status: DC | PRN

## 2023-09-13 MED ORDER — VITAMIN B-12 1000 MCG PO TABS
1000.0000 ug | ORAL_TABLET | Freq: Every day | ORAL | Status: DC
  Administered 2023-09-13: 1000 ug via ORAL
  Filled 2023-09-13: qty 1

## 2023-09-13 MED ORDER — ONDANSETRON HCL 4 MG/2ML IJ SOLN: 4.0000 mg | Freq: Once | INTRAMUSCULAR | Status: DC | PRN

## 2023-09-13 MED ORDER — EZETIMIBE 10 MG PO TABS
10.0000 mg | ORAL_TABLET | Freq: Every day | ORAL | Status: DC
Start: 2023-09-13 — End: 2023-09-14
  Administered 2023-09-13: 10 mg via ORAL
  Filled 2023-09-13: qty 1

## 2023-09-13 MED ORDER — ACETAMINOPHEN 10 MG/ML IV SOLN
INTRAVENOUS | Status: DC | PRN
  Administered 2023-09-13: 1000 mg via INTRAVENOUS

## 2023-09-13 MED ORDER — ONDANSETRON HCL 4 MG/2ML IJ SOLN
INTRAMUSCULAR | Status: DC | PRN
  Administered 2023-09-13: 4 mg via INTRAVENOUS

## 2023-09-13 MED ORDER — PHENYLEPHRINE 80 MCG/ML (10ML) SYRINGE FOR IV PUSH (FOR BLOOD PRESSURE SUPPORT)
PREFILLED_SYRINGE | INTRAVENOUS | Status: DC | PRN
  Administered 2023-09-13 (×2): 160 ug via INTRAVENOUS

## 2023-09-13 MED ORDER — PROPOFOL 10 MG/ML IV BOLUS
INTRAVENOUS | Status: DC | PRN
  Administered 2023-09-13: 140 mg via INTRAVENOUS

## 2023-09-13 MED ORDER — ORAL CARE MOUTH RINSE: 15.0000 mL | Freq: Once | OROMUCOSAL | Status: AC

## 2023-09-13 MED ORDER — LACTATED RINGERS IV SOLN: INTRAVENOUS | Status: DC | PRN

## 2023-09-13 MED ORDER — PHENYLEPHRINE 80 MCG/ML (10ML) SYRINGE FOR IV PUSH (FOR BLOOD PRESSURE SUPPORT)
PREFILLED_SYRINGE | INTRAVENOUS | Status: AC
Start: 2023-09-13 — End: 2023-09-13
  Filled 2023-09-13: qty 10

## 2023-09-13 MED ORDER — SUGAMMADEX SODIUM 200 MG/2ML IV SOLN
INTRAVENOUS | Status: DC | PRN
  Administered 2023-09-13: 100 mg via INTRAVENOUS

## 2023-09-13 MED ORDER — ROCURONIUM BROMIDE 10 MG/ML (PF) SYRINGE
PREFILLED_SYRINGE | INTRAVENOUS | Status: DC | PRN
  Administered 2023-09-13: 50 mg via INTRAVENOUS

## 2023-09-13 MED ORDER — CEFAZOLIN SODIUM-DEXTROSE 2-3 GM-%(50ML) IV SOLR
INTRAVENOUS | Status: DC | PRN
  Administered 2023-09-13: 2 g via INTRAVENOUS

## 2023-09-13 MED ORDER — IPRATROPIUM BROMIDE 0.06 % NA SOLN
1.0000 | Freq: Every day | NASAL | Status: DC
  Filled 2023-09-13: qty 15

## 2023-09-13 MED ORDER — DEXTROSE-SODIUM CHLORIDE 5-0.9 % IV SOLN: INTRAVENOUS | Status: DC

## 2023-09-13 MED ORDER — TADALAFIL 5 MG PO TABS
5.0000 mg | ORAL_TABLET | Freq: Every day | ORAL | Status: DC
  Administered 2023-09-13: 5 mg via ORAL
  Filled 2023-09-13 (×2): qty 1

## 2023-09-13 MED ORDER — FENTANYL CITRATE (PF) 250 MCG/5ML IJ SOLN
INTRAMUSCULAR | Status: DC | PRN
  Administered 2023-09-13: 100 ug via INTRAVENOUS
  Administered 2023-09-13 (×3): 50 ug via INTRAVENOUS

## 2023-09-13 MED ORDER — FENTANYL CITRATE (PF) 250 MCG/5ML IJ SOLN
INTRAMUSCULAR | Status: AC
  Filled 2023-09-13: qty 5

## 2023-09-13 MED ORDER — MIDAZOLAM HCL 2 MG/2ML IJ SOLN
INTRAMUSCULAR | Status: AC
  Filled 2023-09-13: qty 2

## 2023-09-13 MED ORDER — LIDOCAINE 2% (20 MG/ML) 5 ML SYRINGE
INTRAMUSCULAR | Status: DC | PRN
  Administered 2023-09-13: 80 mg via INTRAVENOUS

## 2023-09-13 MED ORDER — 0.9 % SODIUM CHLORIDE (POUR BTL) OPTIME
TOPICAL | Status: DC | PRN
  Administered 2023-09-13: 1000 mL

## 2023-09-13 MED ORDER — LIDOCAINE 2% (20 MG/ML) 5 ML SYRINGE
INTRAMUSCULAR | Status: AC
  Filled 2023-09-13: qty 10

## 2023-09-13 MED ORDER — HYDROMORPHONE HCL 1 MG/ML IJ SOLN
INTRAMUSCULAR | Status: AC
  Filled 2023-09-13: qty 0.5

## 2023-09-13 MED ORDER — METOPROLOL TARTRATE 25 MG PO TABS
25.0000 mg | ORAL_TABLET | Freq: Two times a day (BID) | ORAL | Status: DC
  Administered 2023-09-13 (×2): 25 mg via ORAL
  Filled 2023-09-13 (×2): qty 1

## 2023-09-13 MED ORDER — DEXAMETHASONE SODIUM PHOSPHATE 10 MG/ML IJ SOLN
INTRAMUSCULAR | Status: DC | PRN
  Administered 2023-09-13: 10 mg via INTRAVENOUS

## 2023-09-13 MED ORDER — SUCCINYLCHOLINE CHLORIDE 200 MG/10ML IV SOSY
PREFILLED_SYRINGE | INTRAVENOUS | Status: AC
  Filled 2023-09-13: qty 10

## 2023-09-13 MED ORDER — PHENYLEPHRINE HCL-NACL 20-0.9 MG/250ML-% IV SOLN
INTRAVENOUS | Status: DC | PRN
  Administered 2023-09-13: 30 ug/min via INTRAVENOUS

## 2023-09-13 MED ORDER — GLYCOPYRROLATE PF 0.2 MG/ML IJ SOSY
PREFILLED_SYRINGE | INTRAMUSCULAR | Status: DC | PRN
  Administered 2023-09-13: .1 mg via INTRAVENOUS

## 2023-09-13 MED ORDER — FINASTERIDE 5 MG PO TABS
5.0000 mg | ORAL_TABLET | Freq: Every day | ORAL | Status: DC
  Administered 2023-09-13: 5 mg via ORAL
  Filled 2023-09-13: qty 1

## 2023-09-13 SURGICAL SUPPLY — 48 items
BAG COUNTER SPONGE SURGICOUNT (BAG) ×1 IMPLANT
BLADE SURG 15 STRL LF DISP TIS (BLADE) IMPLANT
CANISTER SUCTION 3000ML PPV (SUCTIONS) ×1 IMPLANT
CLEANER TIP ELECTROSURG 2X2 (MISCELLANEOUS) ×1 IMPLANT
CNTNR URN SCR LID CUP LEK RST (MISCELLANEOUS) IMPLANT
CORD BIPOLAR FORCEPS 12FT (ELECTRODE) ×1 IMPLANT
COVER SURGICAL LIGHT HANDLE (MISCELLANEOUS) ×1 IMPLANT
DERMABOND ADVANCED .7 DNX12 (GAUZE/BANDAGES/DRESSINGS) ×1 IMPLANT
DRAIN 1/8 RD END PERF LFSIL ST (DRAIN) IMPLANT
DRAPE HALF SHEET 40X57 (DRAPES) IMPLANT
DRAPE INCISE 23X17 STRL (DRAPES) ×1 IMPLANT
DRSG TEGADERM 2-3/8X2-3/4 SM (GAUZE/BANDAGES/DRESSINGS) IMPLANT
DRSG TEGADERM 4X4.75 (GAUZE/BANDAGES/DRESSINGS) IMPLANT
ELECT COATED BLADE 2.86 ST (ELECTRODE) ×1 IMPLANT
ELECTRODE REM PT RTRN 9FT ADLT (ELECTROSURGICAL) ×1 IMPLANT
EVACUATOR SILICONE 100CC (DRAIN) ×1 IMPLANT
FORCEPS BIPOLAR SPETZLER 8 1.0 (NEUROSURGERY SUPPLIES) ×1 IMPLANT
GAUZE 4X4 16PLY ~~LOC~~+RFID DBL (SPONGE) ×1 IMPLANT
GLOVE BIO SURGEON STRL SZ 6.5 (GLOVE) ×1 IMPLANT
GLOVE ECLIPSE 7.5 STRL STRAW (GLOVE) ×1 IMPLANT
GOWN STRL REUS W/ TWL LRG LVL3 (GOWN DISPOSABLE) ×2 IMPLANT
KIT BASIN OR (CUSTOM PROCEDURE TRAY) ×1 IMPLANT
KIT TURNOVER KIT B (KITS) ×1 IMPLANT
LOCATOR NERVE 3 VOLT (DISPOSABLE) IMPLANT
NDL PRECISIONGLIDE 27X1.5 (NEEDLE) ×1 IMPLANT
NEEDLE PRECISIONGLIDE 27X1.5 (NEEDLE) ×1 IMPLANT
NS IRRIG 1000ML POUR BTL (IV SOLUTION) ×1 IMPLANT
PAD ARMBOARD POSITIONER FOAM (MISCELLANEOUS) ×2 IMPLANT
PENCIL FOOT CONTROL (ELECTRODE) ×1 IMPLANT
PROBE NERVBE PRASS .33 (MISCELLANEOUS) IMPLANT
SHEARS HARMONIC 9CM CVD (BLADE) ×1 IMPLANT
SPONGE INTESTINAL PEANUT (DISPOSABLE) IMPLANT
STAPLER SKIN PROX 35W (STAPLE) ×1 IMPLANT
SUT CHROMIC 3 0 SH 27 (SUTURE) ×1 IMPLANT
SUT CHROMIC 5 0 P 3 (SUTURE) IMPLANT
SUT ETHILON 3 0 PS 1 (SUTURE) ×1 IMPLANT
SUT ETHILON 5 0 PS 2 18 (SUTURE) IMPLANT
SUT MNCRL AB 4-0 PS2 18 (SUTURE) ×1 IMPLANT
SUT NYLON ETHILON 5-0 P-3 1X18 (SUTURE) IMPLANT
SUT SILK 2 0 REEL (SUTURE) IMPLANT
SUT SILK 2 0 SH CR/8 (SUTURE) ×1 IMPLANT
SUT SILK 3 0 SH CR/8 (SUTURE) ×1 IMPLANT
SUT SILK 4 0 REEL (SUTURE) ×1 IMPLANT
SUT VIC AB 3-0 SH 18 (SUTURE) IMPLANT
SYR CONTROL 10ML LL (SYRINGE) IMPLANT
TOWEL GREEN STERILE FF (TOWEL DISPOSABLE) ×1 IMPLANT
TRAY ENT MC OR (CUSTOM PROCEDURE TRAY) ×1 IMPLANT
TRAY FOLEY MTR SLVR 14FR STAT (SET/KITS/TRAYS/PACK) IMPLANT

## 2023-09-13 NOTE — Op Note (Addendum)
 OPERATIVE REPORT  DATE OF SURGERY: 09/13/2023  PATIENT:  Charles Solis,  73 y.o. 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  Left partial pinnectomy, suture marks superior 2.  Left superficial parotidectomy with overlying skin paddle, suture marks superior. 3.  Left modified neck dissection level 2 and 3, double suture marks level 2 and single suture marks level 3  COUNTS:  Correct  PROCEDURE DETAILS: The patient was taken to the operating room and placed on the operating table in the supine position. Following induction of general endotracheal anesthesia, the left ear face and neck were prepped and draped in standard fashion.  Facial nerve monitoring electrode array was established.  1.  Partial left pinnectomy.  A wedge resection was outlined with a marking pen involving adequate margins from the palpable nodules.  The wedge was approximately 3 cm in greatest dimension along the helix and proceeded all the way down into the conchal bowl.  Electrocautery was used to incise the skin and through the cartilage and the entire wedge was resected and marked for orientation.  Bipolar cautery was used for hemostasis.  The defect was then reapproximated without tension in 2 layers using running and interrupted 4-0 Monocryl.  Care was taken to provide anatomic alignment of all of the structures.  Dermabond was applied to the skin.  2.  Left superficial parotidectomy with facial nerve dissection.  Incision was outlined in  preauricular crease with extension down to include an ellipse of skin overlying the palpable mass.  The skin paddle was approximately 4 cm in width and approximately 7 cm in superior to inferior length.  Electrocautery was used to incise the skin and subcutaneous tissue.  Cautery was used for hemostasis.  Skin flap was developed anteriorly in the superficial plane.  The mass and the parotid were dissected off of the ear canal and down towards the digastric muscle.  There was adhesion of the tumor to the upper sternocleidomastoid muscle so a cuff of muscle was taken as margin.  The tumor was dissected more anteriorly off of the ear canal and once the posterior digastric and tympanomastoid suture line were easily identified the main trunk of the facial nerve was identified.  The upper division was dissected initially.  The harmonic dissector was used to divide parotid tissue.  The lower division was then dissected as the specimen was brought inferiorly.  All branches were identified and preserved.  There is no obvious palpable involvement of the deep lobe.  The entire superficial lobe with accompanying mass was removed and sent for pathologic evaluation with marking suture for orientation.  The nerve stimulated with the electric stimulator.  3.  Left modified selective neck dissection.  Lymph node bearing tissue was dissected off of the lateral femoral cleidomastoid muscle down to the level of the omohyoid.  This was then dissected off the medial aspect.  The spinal accessory nerve was identified and dissected free all the way up to the medial portion of the digastric.  The lymph node bearing tissue posterior superior to  the accessory nerve was dissected down to the splenius capitis and levator scapular muscles.  This was brought under the nerve and brought forward.  The cervical plexus branches were preserved and all fibrofatty tissue was dissected lateral to that and brought forward towards the internal jugular  vein.  The internal jugular vein was dissected free of all surrounding tissue using a 15 scalpel.  The hypoglossal nerve was identified and dissected and preserved.  The ansa hypoglossal was sacrificed.  Ranine veins were ligated with silk suture.  Branches of the internal jugular were identified and ligated between clamps using 4-0 silk ties.  Carotid and vagus were dissected free of surrounding tissue and preserved.  Specimen was delivered and marked with silk suture for levels 2 and 3 and sent for pathologic evaluation.  The wound was irrigated with saline.  Closure was accomplished in 2 layers using interrupted 3-0 chromic on the platysma layer lower down and the deeper layers in the upper portion.  The anterior skin flap was rotated superiorly to facilitate closure of the skin defect.  Subcuticular running 4-0 Monocryl was applied and then Dermabond on the skin.  Tegaderm dressings were applied over the drain and over the midportion of the incision to facilitate airtight closure.  The drain was charged.  Patient was awakened extubated and transferred to recovery in stable condition.  Note: The facial nerve monitoring was essential in this procedure due to the proximity of the tumor to the lower division of the facial nerve. Surgical assistant was necessary in this case for retraction, and assisting with identifying facial movements throughout the procedure especially dissecting the facial nerve.    PATIENT DISPOSITION:  To PACU, stable

## 2023-09-13 NOTE — Interval H&P Note (Signed)
 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  The various methods of treatment have been discussed with the patient and family. After consideration of risks, benefits and other options for treatment, the patient has consented to  Procedure(s) with comments: DISSECTION, NECK, RADICAL (Left) EXCISION, PAROTID GLAND (Left) OTOPLASTY (Left) - Left wedge resection of pinna with primary closure. as a surgical intervention.  The patient's history has been reviewed, patient examined, no change in status, stable for surgery.  I have reviewed the patient's chart and labs.  Questions were answered to the patient's satisfaction.     Ida Loader

## 2023-09-13 NOTE — Progress Notes (Signed)
 Patient's JP drain connected to Low continuous wall suction.

## 2023-09-13 NOTE — Anesthesia Procedure Notes (Signed)
 Procedure Name: Intubation Date/Time: 09/13/2023 8:38 AM  Performed by: Lamar Lucie DASEN, CRNAPre-anesthesia Checklist: Patient identified, Emergency Drugs available, Suction available and Patient being monitored Patient Re-evaluated:Patient Re-evaluated prior to induction Oxygen  Delivery Method: Circle system utilized Preoxygenation: Pre-oxygenation with 100% oxygen  Induction Type: IV induction Ventilation: Mask ventilation without difficulty Laryngoscope Size: Mac and 4 Grade View: Grade I Tube type: Oral Tube size: 7.5 mm Number of attempts: 1 Airway Equipment and Method: Stylet and Oral airway Placement Confirmation: ETT inserted through vocal cords under direct vision, positive ETCO2 and breath sounds checked- equal and bilateral Secured at: 22 cm Tube secured with: Tape Dental Injury: Teeth and Oropharynx as per pre-operative assessment

## 2023-09-13 NOTE — Transfer of Care (Signed)
 Immediate Anesthesia Transfer of Care Note  Patient: Charles Solis  Procedure(s) Performed: DISSECTION, NECK, RADICAL (Left) EXCISION, PAROTID GLAND (Left) OTOPLASTY (Left)  Patient Location: PACU  Anesthesia Type:General  Level of Consciousness: drowsy and patient cooperative  Airway & Oxygen  Therapy: Patient Spontanous Breathing and Patient connected to nasal cannula oxygen   Post-op Assessment: Report given to RN, Post -op Vital signs reviewed and stable, and Patient moving all extremities X 4  Post vital signs: Reviewed and stable  Last Vitals:  Vitals Value Taken Time  BP 126/85 09/13/23 11:30  Temp 36.7 C 09/13/23 11:30  Pulse 99 09/13/23 11:32  Resp 11 09/13/23 11:32  SpO2 97 % 09/13/23 11:32  Vitals shown include unfiled device data.  Last Pain:  Vitals:   09/13/23 0705  TempSrc:   PainSc: 0-No pain         Complications: No notable events documented.

## 2023-09-13 NOTE — Plan of Care (Signed)

## 2023-09-13 NOTE — Anesthesia Postprocedure Evaluation (Signed)
 Anesthesia Post Note  Patient: Charles Solis  Procedure(s) Performed: DISSECTION, NECK, RADICAL (Left) EXCISION, PAROTID GLAND (Left) OTOPLASTY (Left)     Patient location during evaluation: PACU Anesthesia Type: General Level of consciousness: awake and alert and oriented Pain management: pain level controlled Vital Signs Assessment: post-procedure vital signs reviewed and stable Respiratory status: spontaneous breathing, nonlabored ventilation, respiratory function stable and patient connected to nasal cannula oxygen  Cardiovascular status: blood pressure returned to baseline and stable Postop Assessment: no apparent nausea or vomiting Anesthetic complications: no   No notable events documented.  Last Vitals:  Vitals:   09/13/23 1215 09/13/23 1230  BP: (!) 137/93 (!) 145/98  Pulse: 98 (!) 102  Resp: 13 14  Temp:    SpO2: 94% 98%    Last Pain:  Vitals:   09/13/23 1200  TempSrc:   PainSc: 0-No pain                 Zaine Elsass A.

## 2023-09-13 NOTE — Plan of Care (Signed)

## 2023-09-13 NOTE — Progress Notes (Signed)
 Postop check,  Awake and alert, no significant complaints.  Facial nerve function is normal and symmetric.  Drain was charged and holding a seal.  Ear and skin flaps are a little bit dusky but appear healthy.  Stable postop.  Continue overnight care.

## 2023-09-14 ENCOUNTER — Encounter (HOSPITAL_COMMUNITY): Payer: Self-pay | Admitting: Otolaryngology

## 2023-09-14 NOTE — Plan of Care (Signed)
   Problem: Education: Goal: Knowledge of General Education information will improve Description: Including pain rating scale, medication(s)/side effects and non-pharmacologic comfort measures Outcome: Progressing   Problem: Health Behavior/Discharge Planning: Goal: Ability to manage health-related needs will improve Outcome: Progressing   Problem: Clinical Measurements: Goal: Ability to maintain clinical measurements within normal limits will improve Outcome: Progressing Goal: Will remain free from infection Outcome: Progressing Goal: Diagnostic test results will improve Outcome: Progressing Goal: Respiratory complications will improve Outcome: Progressing Goal: Cardiovascular complication will be avoided Outcome: Progressing   Problem: Activity: Goal: Risk for activity intolerance will decrease Outcome: Progressing   Problem: Nutrition: Goal: Adequate nutrition will be maintained Outcome: Progressing   Problem: Coping: Goal: Level of anxiety will decrease Outcome: Progressing   Problem: Elimination: Goal: Will not experience complications related to bowel motility Outcome: Progressing Goal: Will not experience complications related to urinary retention Outcome: Progressing   Problem: Pain Managment: Goal: General experience of comfort will improve and/or be controlled Outcome: Progressing   Problem: Safety: Goal: Ability to remain free from injury will improve Outcome: Progressing   Problem: Skin Integrity: Goal: Risk for impaired skin integrity will decrease Outcome: Progressing   Problem: Education: Goal: Knowledge of the prescribed therapeutic regimen will improve Outcome: Progressing   Problem: Activity: Goal: Ability to tolerate increased activity will improve Outcome: Progressing   Problem: Health Behavior/Discharge Planning: Goal: Identification of resources available to assist in meeting health care needs will improve Outcome: Progressing    Problem: Nutrition: Goal: Maintenance of adequate nutrition will improve Outcome: Progressing   Problem: Clinical Measurements: Goal: Complications related to the disease process, condition or treatment will be avoided or minimized Outcome: Progressing   Problem: Respiratory: Goal: Will regain and/or maintain adequate ventilation Outcome: Progressing   Problem: Skin Integrity: Goal: Demonstration of wound healing without infection will improve Outcome: Progressing

## 2023-09-14 NOTE — Progress Notes (Signed)
 Subjective: Doing great, minimal pain, no complaints.  Objective: Vital signs in last 24 hours: Temp:  [97.5 F (36.4 C)-98.1 F (36.7 C)] 97.6 F (36.4 C) (07/31 0512) Pulse Rate:  [81-102] 81 (07/31 0512) Resp:  [10-18] 17 (07/31 0512) BP: (121-145)/(74-98) 137/98 (07/31 0512) SpO2:  [94 %-100 %] 100 % (07/31 0512) Weight change:     Intake/Output from previous day: 07/30 0701 - 07/31 0700 In: 2384.9 [P.O.:358; I.V.:1676.9; IV Piggyback:350] Out: 1108 [Urine:1050; Drains:8; Blood:50] Intake/Output this shift: No intake/output data recorded.  PHYSICAL EXAM: Awake and alert.  Flaps look excellent, good color.  Ear repair is also healthy looking.  Drain is holding a Water quality scientist.  Facial nerve function normal.  Lab Results: Recent Labs    09/11/23 1059  WBC 7.6  HGB 13.9  HCT 41.4  PLT 208   BMET Recent Labs    09/11/23 1059  NA 140  K 3.8  CL 108  CO2 24  GLUCOSE 95  BUN 19  CREATININE 0.91  CALCIUM  9.2    Studies/Results: No results found.  Medications: I have reviewed the patient's current medications.  Assessment/Plan: Stable postop day 1.  Ready for discharge.  Will send home with drain.  Plan on removing it Monday if appropriate output.  Does not require pain medication.  LOS: 1 day      Ida Loader 09/14/2023, 8:41 AM

## 2023-09-14 NOTE — Discharge Instructions (Signed)
 Keep the surgical site clean and dry.  Do not use any ointments creams or oils.

## 2023-09-15 LAB — SURGICAL PATHOLOGY

## 2023-09-15 NOTE — Discharge Summary (Signed)
 Physician Discharge Summary  Patient ID: Charles Solis MRN: [id number] DOB/AGE: 1950-03-02 73 y.o.  Admit date: 09/13/2023 Discharge date: 09/15/2023  Admission Diagnoses:metastatic carcinoma left parotid  Discharge Diagnoses:  Principal Problem:   Metastasis from skin cancer [facility])   Discharged Condition: good  Hospital Course: no complications.  Consults: none  Significant Diagnostic Studies: none  Treatments: surgery: left parotidectomy, neck dissection, partial pinnectomy  Discharge Exam: Blood pressure 120/88, pulse 72, temperature 97.8 F (36.6 C), temperature source Oral, resp. rate 17, height 6' 1 (1.854 m), weight 83.9 kg, SpO2 99%. PHYSICAL EXAM: Awake and alert, facial nerve function symmetric, flaps down and good skin color.  Disposition: Discharge disposition: 01-Home or Self Care       Discharge Instructions     Diet - low sodium heart healthy   Complete by: As directed    Discharge wound care:   Complete by: As directed    Empty drain, record output, recharge drain 3 times daily.   Increase activity slowly   Complete by: As directed    Avoid strenuous activity for 3 weeks.  Start gentle activity in 2 weeks.  Walking inside or outside is appropriate.  Avoid getting the surgical sites wet until the drain is removed.      Allergies as of 09/14/2023   No Known Allergies      Medication List     TAKE these medications    cyanocobalamin  1000 MCG tablet Commonly known as: VITAMIN B12 Take 1,000 mcg by mouth daily.   ezetimibe  10 MG tablet Commonly known as: ZETIA  TAKE ONE TABLET BY MOUTH EVERY DAY   finasteride  5 MG tablet Commonly known as: PROSCAR  Take 5 mg by mouth daily.   ipratropium 0.03 % nasal spray Commonly known as: ATROVENT  Place 1 spray into both nostrils daily.   metoprolol  tartrate 25 MG tablet Commonly known as: LOPRESSOR  Take 1 tablet (25 mg total) by mouth 2 (two) times daily.   rosuvastatin  20 MG tablet Commonly  known as: CRESTOR  TAKE 1 TABLET BY MOUTH ONCE DAILY   Saw Palmetto  500 MG Caps Take 1 capsule 3 (three) times daily by mouth.   silodosin 8 MG Caps capsule Commonly known as: RAPAFLO Take 8 mg by mouth daily with breakfast.   tadalafil  5 MG tablet Commonly known as: CIALIS  Take 1 tablet by mouth daily.               Discharge Care Instructions  (From admission, onward)           Start     Ordered   09/14/23 0000  Discharge wound care:       Comments: Empty drain, record output, recharge drain 3 times daily.   09/14/23 9155            Follow-up Information     Jesus Oliphant, MD Follow up on 09/18/2023.   Specialty: Otolaryngology Why: Come to the office at 11:30 AM. Contact information: [address] [place] [address] [phone]                 Signed: Oliphant Jesus 09/15/2023, 7:33 AM

## 2023-09-18 DIAGNOSIS — C779 Secondary and unspecified malignant neoplasm of lymph node, unspecified: Secondary | ICD-10-CM | POA: Diagnosis not present

## 2023-09-18 DIAGNOSIS — C44309 Unspecified malignant neoplasm of skin of other parts of face: Secondary | ICD-10-CM | POA: Diagnosis not present

## 2023-09-18 DIAGNOSIS — C44300 Unspecified malignant neoplasm of skin of unspecified part of face: Secondary | ICD-10-CM | POA: Diagnosis not present

## 2023-09-19 ENCOUNTER — Other Ambulatory Visit (HOSPITAL_COMMUNITY): Payer: Self-pay | Admitting: Otolaryngology

## 2023-09-19 DIAGNOSIS — C44300 Unspecified malignant neoplasm of skin of unspecified part of face: Secondary | ICD-10-CM

## 2023-09-19 DIAGNOSIS — C779 Secondary and unspecified malignant neoplasm of lymph node, unspecified: Secondary | ICD-10-CM

## 2023-09-19 DIAGNOSIS — C44309 Unspecified malignant neoplasm of skin of other parts of face: Secondary | ICD-10-CM

## 2023-09-20 DIAGNOSIS — M170 Bilateral primary osteoarthritis of knee: Secondary | ICD-10-CM | POA: Diagnosis not present

## 2023-09-26 ENCOUNTER — Ambulatory Visit (INDEPENDENT_AMBULATORY_CARE_PROVIDER_SITE_OTHER): Admitting: Otolaryngology

## 2023-09-28 ENCOUNTER — Ambulatory Visit (HOSPITAL_COMMUNITY)
Admission: RE | Admit: 2023-09-28 | Discharge: 2023-09-28 | Disposition: A | Discharge status: Home / Self Care | Source: Ambulatory Visit | Attending: Otolaryngology | Admitting: Otolaryngology

## 2023-09-28 DIAGNOSIS — C44309 Unspecified malignant neoplasm of skin of other parts of face: Secondary | ICD-10-CM | POA: Insufficient documentation

## 2023-09-28 DIAGNOSIS — C779 Secondary and unspecified malignant neoplasm of lymph node, unspecified: Secondary | ICD-10-CM | POA: Diagnosis not present

## 2023-09-28 DIAGNOSIS — C44300 Unspecified malignant neoplasm of skin of unspecified part of face: Secondary | ICD-10-CM | POA: Diagnosis not present

## 2023-09-28 DIAGNOSIS — R911 Solitary pulmonary nodule: Secondary | ICD-10-CM | POA: Diagnosis not present

## 2023-09-28 LAB — GLUCOSE, CAPILLARY: Glucose-Capillary: 88 mg/dL (ref 70–99)

## 2023-09-28 MED ORDER — FLUDEOXYGLUCOSE F - 18 (FDG) INJECTION
9.1900 | Freq: Once | INTRAVENOUS | Status: AC
  Administered 2023-09-28: 9.19 via INTRAVENOUS

## 2023-10-02 NOTE — Progress Notes (Signed)
 Oncology Nurse Navigator Documentation   Placed introductory call to new referral patient Charles Solis.  Introduced myself as the H&N oncology nurse navigator that works with Dr. Izell and Dr. Autumn to whom he has been referred by Dr. Jesus. He confirmed understanding of referral. Briefly explained my role as his navigator, provided my contact information.  Confirmed understanding of upcoming appts and [facility] location, explained arrival and registration process.  I encouraged him to call with questions/concerns as he moves forward with appts and procedures.   He verbalized understanding of information provided, expressed appreciation for my call.   Navigator Initial Assessment Employment Status: he is working part-time Currently on [employer] / STD: no Living Situation: Support System: friends, family PCP: None PCD: na Financial Concerns: not at this time Transportation Needs: no Sensory Deficits: none Chiropodist Needed:  no Ambulation Needs: no Psychosocial Needs:  no Concerns/Needs Understanding Cancer:  addressed/answered by navigator to best of ability Self-Expressed Needs: no   Tourist information centre manager, BSN, OCN Head & Neck Oncology Nurse Navigator [facility] Phone # [phone]  Fax # [phone]

## 2023-10-02 NOTE — Progress Notes (Addendum)
 Radiation Oncology         [phone] ________________________________  Initial outpatient Consultation  Name: Charles Solis MRN: [id number]  Date: 10/03/2023  DOB: 08-05-1950  CC:Pcp, No  Charles Oliphant, MD   REFERRING PHYSICIAN: Jesus Oliphant, MD  DIAGNOSIS: C77.0   ICD-10-CM   1. Cancer of skin of left ear  C44.209     2. Skin cancer  C44.90     3. 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.0        Cancer Staging  Skin cancer Staging form: Cutaneous Carcinoma of the Head and Neck, AJCC 8th Edition - Pathologic stage from 10/04/2023: Stage IV (rpTX, rpN3b, rcM0) - Signed by Izell Domino, MD on 10/04/2023 Stage prefix: Recurrence Extraosseous extension: Unknown    CHIEF COMPLAINT: Here to discuss management of skin cancer/neck cancer  HISTORY OF PRESENT ILLNESS::Charles Solis is a 73 y.o. male with a history significant for prostate cancer and skin cancer with multiple recurrences. The most recent skin cancer removal was Mohs surgery to the left pinna about 3 years ago.   A few months ago, patient developed a lesion located on his left jaw area. Lesion has since continued increasing in size. To further investigate his symptoms, he underwent a CT neck on 07/18/23 showing an ovoid centrally hypodense lesion present posteriorly within or abutting the superficial lobe of the left parotid, measuring approximately 22 x 17 x 18 mm.   In light of findings, he then underwent a fine-needle aspiration biopsy on 08/31/23. Pathology indicated the presence of malignant cells, carcinoma with squamous differentiation, with no lymphoid tissue identified.  Patient then presented for a second-opinion with Dr. Jesus on 09/04/23. Based on his recommendations, patient underwent a parotidectomy with radical neck dissection on 09/13/23. Surgical pathology redemonstrated tumor size of 3.5 x 3.2 x 2.3 cm  and histology of  invasive moderately differentiated squamous cell carcinoma with  tumor present in dermis and infiltrates the subcutaneous adipose to superficially invade the underlying parotid gland. Tumor present in 3:00 margin but is negative for angiolymphatic and perineural invasion. Neck dissection pathology of 20 lymph nodes are negative for carcinoma.            For staging purposes, he underwent a PET scan on 09/28/23- results pending.    PATHOLOGY:  A. LEFT EAR, PARTIAL PINNECTOMY:  Dermal scar with keloid  Negative for carcinoma   B. LEFT SUPERFICIAL PAROTID, PAROTIDECTOMY:  Invasive moderately differentiated squamous cell carcinoma  Tumor present in dermis and infiltrates the subcutaneous adipose to  superficially invade the underlying parotid gland  Tumor measures 3.5 x 3.2 x 2.3 cm (pT3)  Tumor present in 3:00 margin [1 mm from deep soft tissue margin and 3.5  mm from 9:00 margin (parotid margin free)]  Negative for angiolymphatic and perineural invasion  Focal inflammatory changes consistent with prior biopsy  Marked solar elastosis  One benign lymph node, negative for carcinoma (0/1)   C. LEFT NECK DISSECTION:  Twenty benign lymph nodes, negative for carcinoma (0/20, pN0)     PREVIOUS RADIATION THERAPY: No  PAST MEDICAL HISTORY:  has a past medical history of High cholesterol, Hypothyroidism, and Prostate cancer (HCC).    PAST SURGICAL HISTORY: Past Surgical History:  Procedure Laterality Date   HERNIA REPAIR     inguinal   MOHS SURGERY     head,ear,cheek,nose   OTOPLASATY Left 09/13/2023   Procedure: OTOPLASTY;  Surgeon: Charles Oliphant, MD;  Location: [facility] OR;  Service: ENT;  Laterality: Left;  Left wedge resection of pinna with primary closure.   PAROTIDECTOMY Left 09/13/2023   Procedure: EXCISION, PAROTID GLAND;  Surgeon: Charles Oliphant, MD;  Location: [facility] OR;  Service: ENT;  Laterality: Left;   RADICAL NECK DISSECTION Left 09/13/2023   Procedure: DISSECTION, NECK, RADICAL;  Surgeon: Charles Oliphant, MD;  Location: [facility] OR;  Service: ENT;  Laterality:  Left;   TONSILLECTOMY      FAMILY HISTORY: family history includes Alzheimer's disease in his mother and paternal grandmother; Colon cancer in his father; Coronary artery disease in his maternal grandfather; Heart attack in his father and maternal grandfather; Heart disease in his paternal grandfather; Hyperlipidemia in his sister; Hypertension in his mother; Lung cancer in his paternal grandfather.  SOCIAL HISTORY:  reports that he has quit smoking. He has never used smokeless tobacco. He reports current alcohol use of about 1.0 standard drink of alcohol per week. He reports that he does not use drugs.  ALLERGIES: Patient has no known allergies.  MEDICATIONS:  Current Outpatient Medications  Medication Sig Dispense Refill   cyanocobalamin  (VITAMIN B12) 1000 MCG tablet Take 1,000 mcg by mouth daily.     ezetimibe  (ZETIA ) 10 MG tablet TAKE ONE TABLET BY MOUTH EVERY DAY 90 tablet 3   finasteride  (PROSCAR ) 5 MG tablet Take 5 mg by mouth daily.     ipratropium (ATROVENT ) 0.03 % nasal spray Place 1 spray into both nostrils daily.     metoprolol  tartrate (LOPRESSOR ) 25 MG tablet Take 1 tablet (25 mg total) by mouth 2 (two) times daily. 180 tablet 1   rosuvastatin  (CRESTOR ) 20 MG tablet TAKE 1 TABLET BY MOUTH ONCE DAILY 90 tablet 3   Saw Palmetto  500 MG CAPS Take 1 capsule 3 (three) times daily by mouth.     silodosin (RAPAFLO) 8 MG CAPS capsule Take 8 mg by mouth daily with breakfast.     tadalafil  (CIALIS ) 5 MG tablet Take 1 tablet by mouth daily.     No current facility-administered medications for this encounter.    REVIEW OF SYSTEMS:  Notable for that above.   PHYSICAL EXAM:  height is 6' 1 (1.854 m) and weight is 188 lb 12.8 oz (85.6 kg). His temperature is 97.8 F (36.6 C). His blood pressure is 127/89 and his pulse is 78. His respiration is 18 and oxygen  saturation is 100%.   General: Alert and oriented, in no acute distress   HEENT: Head is normocephalic. Extraocular movements are  intact. Oropharynx is clear. Extremely mild droop in left lip. Decreased sensation in left parotid bed/ear. Neck: Neck is supple, no palpable cervical or supraclavicular lymphadenopathy. Healing well from recent parotid/neck surgery on left Heart: Regular in rate and rhythm with no murmurs, rubs, or gallops. Chest: Clear to auscultation bilaterally, with no rhonchi, wheezes, or rales. Abdomen: Soft, nontender, nondistended, with no rigidity or guarding. Extremities: No cyanosis or edema. Lymphatics: see Neck Exam Skin: No concerning lesions. Musculoskeletal: symmetric strength and muscle tone throughout. Neurologic: Cranial nerves II through XII are grossly intact w/exception to HEENT notes. Speech is fluent. Coordination is intact. Psychiatric: Judgment and insight are intact. Affect is appropriate.   ECOG = 0  0 - Asymptomatic (Fully active, able to carry on all predisease activities without restriction)  1 - Symptomatic but completely ambulatory (Restricted in physically strenuous activity but ambulatory and able to carry out work of a light or sedentary nature. For example, light housework, office work)  2 - Symptomatic, <50% in bed during the  day (Ambulatory and capable of all self care but unable to carry out any work activities. Up and about more than 50% of waking hours)  3 - Symptomatic, >50% in bed, but not bedbound (Capable of only limited self-care, confined to bed or chair 50% or more of waking hours)  4 - Bedbound (Completely disabled. Cannot carry on any self-care. Totally confined to bed or chair)  5 - Death   Raylene MM, Creech RH, Tormey DC, et al. [phone]). Toxicity and response criteria of the [facility]. Am. DOROTHA Bridges. Oncol. 5 (6): 649-55   LABORATORY DATA:  Lab Results  Component Value Date   WBC 7.6 09/11/2023   HGB 13.9 09/11/2023   HCT 41.4 09/11/2023   MCV 89.4 09/11/2023   PLT 208 09/11/2023   CMP     Component Value Date/Time    NA 140 09/11/2023 1059   NA 143 12/20/2022 1439   K 3.8 09/11/2023 1059   CL 108 09/11/2023 1059   CO2 24 09/11/2023 1059   GLUCOSE 95 09/11/2023 1059   BUN 19 09/11/2023 1059   BUN 18 12/20/2022 1439   CREATININE 0.91 09/11/2023 1059   CALCIUM  9.2 09/11/2023 1059   PROT 6.8 08/24/2022 1508   ALBUMIN  4.6 08/24/2022 1508   AST 22 08/24/2022 1508   ALT 20 12/20/2022 1439   ALKPHOS 50 08/24/2022 1508   BILITOT 1.0 08/24/2022 1508   EGFR 80 12/20/2022 1439   GFRNONAA >60 09/11/2023 1059       RADIOGRAPHY: as above, PET results pending    IMPRESSION/PLAN:This is a delightful patient with h/o of left ear skin cancer, now with recurrence in the left parotid (SCC). I recommend adjuvant radiotherapy for this patient. Anticipate 6-6.5 weeks of treatment - clarifying if margin is truly positive with pathologist   We discussed the potential risks, benefits, and side effects of radiotherapy. We talked in detail about acute and late effects. We discussed that some of the most bothersome acute effects may be mucositis, dysgeusia, salivary changes, skin irritation, hair loss, dehydration, weight loss and fatigue. We talked about late effects which include but are not necessarily limited  hypothyroidism, nerve injury, vascular injury, spinal cord injury, xerostomia, trismus, neck edema, dental issues, non-healing wound, and potentially fatal injury to any of the tissues in the head and neck region. No guarantees of treatment were given. A consent form was signed and placed in the patient's medical record. The patient is enthusiastic about proceeding with treatment. I look forward to participating in the patient's care.    Simulation (treatment planning) will take place in near future after clearance by dentistry and PET results are back  We also discussed that the treatment of head and neck cancer is a multidisciplinary process to maximize treatment outcomes and quality of life. For this reason the  following referrals have been or will be made:   Dentistry for dental evaluation, advice on reducing risk of cavities, osteoradionecrosis, or other oral issues. SPDs may be helpful   Nutritionist for nutrition support during and after treatment.   Social work for social support.    Physical therapy due to risk of lymphedema in neck and deconditioning.   Baseline labs including TSH.    I've reached out to Med/onc to see if this patient is a candidate for cempilumab after RT.     On date of service, in total, I spent 70 minutes on this encounter. Patient was seen in person. Note signed after encounter date;  minutes pertain to date of service, only.  ADDENDUM:  I conferred with pathology team and Dr. Jesus: Per Dr. Jesus, the tumor present in the subcutaneous tissue and  subcutaneous adipose is not margin. The skin was all resected en bloc so  those are not margins. Close margins are the deep dissection adjacent to the facial nerve. Status of margins favored to be close, not positive.   I will Rx 60Gy/29fx.  __________________________________________   Lauraine Golden, MD  This document serves as a record of services personally performed by Lauraine Golden, MD. It was created on her behalf by Reymundo Cartwright, a trained medical scribe. The creation of this record is based on the scribe's personal observations and the provider's statements to them. This document has been checked and approved by the attending provider.

## 2023-10-03 ENCOUNTER — Encounter: Payer: Self-pay | Admitting: Radiation Oncology

## 2023-10-03 ENCOUNTER — Ambulatory Visit
Admission: RE | Admit: 2023-10-03 | Discharge: 2023-10-03 | Disposition: A | Discharge status: Home / Self Care | Source: Ambulatory Visit | Attending: Radiation Oncology | Admitting: Radiation Oncology

## 2023-10-03 ENCOUNTER — Ambulatory Visit
Admission: RE | Admit: 2023-10-03 | Discharge: 2023-10-03 | Discharge status: Home / Self Care | Source: Ambulatory Visit | Attending: Radiation Oncology | Admitting: Radiation Oncology

## 2023-10-03 VITALS — BP 127/89 | HR 78 | Temp 97.8°F | Resp 18 | Ht 73.0 in | Wt 188.8 lb

## 2023-10-03 DIAGNOSIS — C44209 Unspecified malignant neoplasm of skin of left ear and external auricular canal: Secondary | ICD-10-CM

## 2023-10-03 DIAGNOSIS — C4490 Unspecified malignant neoplasm of skin, unspecified: Secondary | ICD-10-CM

## 2023-10-03 DIAGNOSIS — C770 Secondary and unspecified malignant neoplasm of lymph nodes of head, face and neck: Secondary | ICD-10-CM

## 2023-10-03 HISTORY — DX: Pure hypercholesterolemia, unspecified: E78.00

## 2023-10-03 NOTE — Progress Notes (Signed)
  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 Lesion has since continued increasing in size .   Biopsies revealed:    Nutrition Status Yes No Comments  Weight changes? []  [x]    Swallowing concerns? []  [x]    PEG? []  [x]     Referrals Yes No Comments  Social Work? [x]  []    Dentistry? [x]  []    Swallowing therapy? [x]  []    Nutrition? [x]  []    Med/Onc? [x]  []     Safety Issues Yes No Comments  Prior radiation? []  [x]    Pacemaker/ICD? []  [x]    Possible current pregnancy? []  [x]    Is the patient on methotrexate? []  [x]     Tobacco/Marijuana/Snuff/ETOH use: Quit 40 years ago  Past/Anticipated interventions by otolaryngology, if any:  09/13/2023 Jesus, MD Radical Left Neck Excision  Parotid Gland Otoplasty  Past/Anticipated interventions by medical/ Radiation oncology, if any:  Radiation Therapy     Current Complaints / other details:    None BP 127/89 (BP Location: Left Arm, Patient Position: Sitting, Cuff Size: Large)   Pulse 78   Temp 97.8 F (36.6 C)   Resp 18   Ht 6' 1 (1.854 m)   Wt 188 lb 12.8 oz (85.6 kg)   SpO2 100%   BMI 24.91 kg/m    Wt Readings from Last 3 Encounters:  10/03/23 188 lb 12.8 oz (85.6 kg)  09/13/23 185 lb (83.9 kg)  09/11/23 192 lb 4.8 oz (87.2 kg)

## 2023-10-03 NOTE — Progress Notes (Signed)
 Oncology Nurse Navigator Documentation   Met with patient during initial consult with Dr. Izell.  He was accompanied by his wife.  Further introduced myself as his/their Navigator, explained my role as a member of the Care Team. Provided New Patient resource guide binder: Contact information for physicians, this navigator, other members of the Care Team Advance Directive information; provided [facility] AD booklet at their request,  Fall Prevention Patient Safety Plan Financial Assistance Information sheet Symptom Management Clinic information [facility] campus map with highlight of [facility] SLP Information sheet Head and Neck cancer basics Nutrition information Patient and family support information including Spiritual care/Chaplain information, Peer mentor program, health and wellness classes, and the survivorship program Community resources  Assisted with post-consult appt scheduling. Referral will be placed to Dr. Danial for possible scatter protective devices and dental clearance before starting radiation.  I toured him to the [facility] treatment area, explained procedures for lobby registration, arrival to Radiation Waiting, and arrival to treatment area.    They verbalized understanding of information provided. I encouraged them to call with questions/concerns moving forward.  Delon Jefferson, RN, BSN, OCN Head & Neck Oncology Nurse Navigator [facility] [place] [phone]

## 2023-10-04 DIAGNOSIS — C4490 Unspecified malignant neoplasm of skin, unspecified: Secondary | ICD-10-CM | POA: Insufficient documentation

## 2023-10-04 DIAGNOSIS — C770 Secondary and unspecified malignant neoplasm of lymph nodes of head, face and neck: Secondary | ICD-10-CM | POA: Diagnosis not present

## 2023-10-04 DIAGNOSIS — C44209 Unspecified malignant neoplasm of skin of left ear and external auricular canal: Secondary | ICD-10-CM | POA: Diagnosis not present

## 2023-10-04 NOTE — Progress Notes (Signed)
 Dental Form with Estimates of Radiation Dose  Charles Solis Riding Date of birth: 1951/01/05     Diagnosis:    ICD-10-CM   1. Cancer of skin of left ear  C44.209     2. Skin cancer  C44.90     3. 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.0       Cancer Staging  Skin cancer Staging form: Cutaneous Carcinoma of the Head and Neck, AJCC 8th Edition - Pathologic stage from 10/04/2023: Stage IV (rpTX, rpN3b, rcM0) - Signed by Izell Domino, MD on 10/04/2023 Stage prefix: Recurrence Extraosseous extension: Unknown   Prognosis: curative  Anticipated # of fractions: 30-33    Daily?: yes  # of weeks of radiotherapy: 6-6.5  Chemotherapy?: TBD-  Adjuvant immunotherapy?  Anticipated xerostomia:  Mild permanent    Pre-simulation needs:    Scatter protection / advice  Simulation: I've requested this to be on Sept 3.  Other Notes:   Please contact Domino Izell, MD, with patient's disposition after evaluation and/or dental treatment. -----------------------------------  Domino Izell, MD

## 2023-10-04 NOTE — Addendum Note (Signed)
 Encounter addended by: Izell Domino, MD on: 10/04/2023 11:49 AM  Actions taken: Clinical Note Signed

## 2023-10-05 ENCOUNTER — Encounter: Payer: Self-pay | Admitting: Oncology

## 2023-10-05 DIAGNOSIS — C779 Secondary and unspecified malignant neoplasm of lymph node, unspecified: Secondary | ICD-10-CM | POA: Diagnosis not present

## 2023-10-05 DIAGNOSIS — C44300 Unspecified malignant neoplasm of skin of unspecified part of face: Secondary | ICD-10-CM | POA: Diagnosis not present

## 2023-10-05 NOTE — Progress Notes (Signed)
 Postop visit, getting ready to start treatments in the next week or 2.  Overall doing well.  His tongue mobility is improving.  Surgical site looks excellent.  Everything is healing nicely.  Minimal scabs.  Facial nerve function is normal.  Tongue mobility improving.  Stable postop.  Follow-up after he completes treatment.

## 2023-10-06 ENCOUNTER — Inpatient Hospital Stay: Attending: Oncology | Admitting: Oncology

## 2023-10-06 ENCOUNTER — Other Ambulatory Visit

## 2023-10-06 VITALS — BP 120/79 | HR 76 | Temp 98.3°F | Resp 17 | Ht 73.0 in | Wt 190.0 lb

## 2023-10-06 DIAGNOSIS — Z83438 Family history of other disorder of lipoprotein metabolism and other lipidemia: Secondary | ICD-10-CM | POA: Insufficient documentation

## 2023-10-06 DIAGNOSIS — N210 Calculus in bladder: Secondary | ICD-10-CM | POA: Diagnosis not present

## 2023-10-06 DIAGNOSIS — E7800 Pure hypercholesterolemia, unspecified: Secondary | ICD-10-CM | POA: Diagnosis not present

## 2023-10-06 DIAGNOSIS — Z8546 Personal history of malignant neoplasm of prostate: Secondary | ICD-10-CM | POA: Diagnosis not present

## 2023-10-06 DIAGNOSIS — I2510 Atherosclerotic heart disease of native coronary artery without angina pectoris: Secondary | ICD-10-CM | POA: Diagnosis not present

## 2023-10-06 DIAGNOSIS — Z8249 Family history of ischemic heart disease and other diseases of the circulatory system: Secondary | ICD-10-CM | POA: Diagnosis not present

## 2023-10-06 DIAGNOSIS — C4449 Other specified malignant neoplasm of skin of scalp and neck: Secondary | ICD-10-CM | POA: Insufficient documentation

## 2023-10-06 DIAGNOSIS — E039 Hypothyroidism, unspecified: Secondary | ICD-10-CM | POA: Insufficient documentation

## 2023-10-06 DIAGNOSIS — Z8349 Family history of other endocrine, nutritional and metabolic diseases: Secondary | ICD-10-CM | POA: Insufficient documentation

## 2023-10-06 DIAGNOSIS — C44209 Unspecified malignant neoplasm of skin of left ear and external auricular canal: Secondary | ICD-10-CM

## 2023-10-06 DIAGNOSIS — Z85828 Personal history of other malignant neoplasm of skin: Secondary | ICD-10-CM | POA: Diagnosis not present

## 2023-10-06 DIAGNOSIS — I700 Atherosclerosis of aorta: Secondary | ICD-10-CM | POA: Diagnosis not present

## 2023-10-06 DIAGNOSIS — Z87891 Personal history of nicotine dependence: Secondary | ICD-10-CM | POA: Insufficient documentation

## 2023-10-06 DIAGNOSIS — Z79899 Other long term (current) drug therapy: Secondary | ICD-10-CM | POA: Insufficient documentation

## 2023-10-06 DIAGNOSIS — Z801 Family history of malignant neoplasm of trachea, bronchus and lung: Secondary | ICD-10-CM | POA: Insufficient documentation

## 2023-10-06 DIAGNOSIS — Z800 Family history of malignant neoplasm of digestive organs: Secondary | ICD-10-CM | POA: Insufficient documentation

## 2023-10-06 NOTE — Progress Notes (Signed)
 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   Post-operative status with positive deep margin, necessitating adjuvant radiation therapy.   No evidence of metastasis, but considered locally advanced due to depth of involvement.   Immunotherapy with cemiplimab proposed post-radiation to prevent recurrence and manage any residual disease.   Discussed the mechanism of action of cemiplimab as an immunotherapy that unmasks cancer cells to the immune system. Potential side effects include immune-mediated organ damage, with thyroid  dysfunction being the most common. Severe side effects are rare, occurring in less than 0.5% of cases. The decision to use cemiplimab is based on its efficacy in preventing recurrence in similar cases and its role as a standard of care in such scenarios.  Patient is agreeable to proceeding with cemiplimab.  - Proceed with radiation therapy for 6-6.5 weeks after complete healing from surgery.  - Plan for immunotherapy with cemiplimab (Libtayo) post-radiation, administered intravenously every three weeks for a  minimum of one year.  - Arrange for an education session with nursing staff to discuss side effects and provide printed information.  - Coordinate with interventional radiology for port placement around the end of radiation therapy.  - Schedule regular blood work to monitor liver function, blood counts, and thyroid  function during immunotherapy.  - Conduct follow-up imaging (CT or PET scan) every three months to monitor for recurrence or metastasis.   I reviewed lab results and outside records for this visit and discussed relevant results with the patient. Diagnosis, plan of care and treatment options were also discussed in detail with the patient. Opportunity provided to ask questions and answers provided to his apparent satisfaction. Provided instructions to call our clinic with any problems, questions or concerns prior to return visit. I recommended to continue follow-up with PCP and sub-specialists. He verbalized understanding and agreed with the plan. No barriers to learning was detected.  NCCN guidelines have been consulted in the planning of this patient's care.  Taeshawn Helfman, MD   [facility] - A DEPT OF [facility] [address] [place] [address] Dept: [phone] Dept Fax: [phone]   HISTORY OF PRESENTING ILLNESS:   I have reviewed his chart and materials related to his cancer extensively and collaborated history with the patient. Summary of oncologic history is as follows:  ONCOLOGY HISTORY:  He has a history significant for prostate cancer and skin cancer with multiple recurrences. The most recent skin cancer removal was Mohs surgery to the left pinna about 3 years ago.    A few months ago, patient developed a lesion located on his left jaw area.  Lesion has since continued increasing in size. To further investigate his symptoms, he underwent a CT neck on 07/18/23 showing an ovoid centrally hypodense  lesion present posteriorly within or abutting the superficial lobe of the left parotid, measuring approximately 22 x 17 x 18 mm.    In light of findings, he then underwent a fine-needle aspiration biopsy on 08/31/23. Pathology indicated the presence of malignant cells, carcinoma with squamous differentiation, with no lymphoid tissue identified.   Patient then presented for a second-opinion with Dr. Jesus on 09/04/23. Based on his recommendations, patient underwent a parotidectomy with radical neck dissection on 09/13/23. Surgical pathology redemonstrated tumor size of 3.5 x 3.2 x 2.3 cm  and histology of  invasive moderately differentiated squamous cell carcinoma with tumor present in dermis and infiltrates the subcutaneous adipose to superficially invade the underlying parotid gland. Tumor present in 3:00 margin but is negative for angiolymphatic and perineural invasion. Neck dissection pathology of 20 lymph nodes are negative for carcinoma.             Staging PET scan on 09/28/2023 showed left sided neck hypermetabolic some without well-defined correlate mass or adenopathy and is likely postoperative.  No evidence of extracervical suspicious activity.  Incidental findings include a 12 mm bladder stone and evidence of CAD and aortic atherosclerosis.  4 mm right lower lobe lung nodule, below PET resolution.  Positive deep margin, necessitating adjuvant radiation therapy.   No evidence of metastasis, but considered locally advanced due to depth of involvement.   Immunotherapy with cemiplimab proposed post-radiation to prevent recurrence and manage any residual disease.  Patient agreeable with this approach.  Oncology History  Skin cancer  10/04/2023 Initial Diagnosis   Skin cancer   10/04/2023 Cancer Staging   Staging form: Cutaneous Carcinoma of the Head and Neck, AJCC 8th Edition - Pathologic stage from 10/04/2023: Stage IV (rpTX, rpN3b, rcM0) - Signed by Izell Domino, MD on 10/04/2023 Stage prefix:  Recurrence Extraosseous extension: Unknown     INTERVAL HISTORY:  Discussed the use of AI scribe software for clinical note transcription with the patient, who gave verbal consent to proceed.  History of Present Illness Demontrae Gilbert Tyshan Enderle is a 73 year old male who presents for follow-up after surgery for squamous cell carcinoma of the head and neck.  He is recovering from surgery for squamous cell carcinoma of the head and neck, which originated from the skin. He is healing well, although he experienced some slurring due to a bruised nerve in his tongue, which has improved significantly over the past three weeks. He had some difficulty chewing post-surgery due to tongue issues but is now eating well and maintaining his weight.  He has a history of multiple basal cell carcinomas, having undergone Mohs surgery four times, including on the ear, nose, cheek, and head. He does not recall any previous diagnosis of squamous cell carcinoma. He regularly visits his dermatologist every six months for a full body scan, during which actinic keratoses are often treated with liquid nitrogen. He has used Efudex on his scalp, most recently this past winter.  A PET scan on September 28, 2023, was performed; the patient is aware of findings including a bladder stone and signs of heart disease. He is currently on statins for heart disease.  No new headaches, vision problems, changes in bowel movements, fatigue, nausea, or changes in appetite.    MEDICAL HISTORY:  Past Medical History:  Diagnosis Date   High cholesterol    Hypothyroidism  Prostate cancer [facility])     SURGICAL HISTORY: Past Surgical History:  Procedure Laterality Date   HERNIA REPAIR     inguinal   MOHS SURGERY     head,ear,cheek,nose   OTOPLASATY Left 09/13/2023   Procedure: OTOPLASTY;  Surgeon: Jesus Oliphant, MD;  Location: [facility] OR;  Service: ENT;  Laterality: Left;  Left wedge resection of pinna with primary closure.   PAROTIDECTOMY  Left 09/13/2023   Procedure: EXCISION, PAROTID GLAND;  Surgeon: Jesus Oliphant, MD;  Location: [facility] OR;  Service: ENT;  Laterality: Left;   RADICAL NECK DISSECTION Left 09/13/2023   Procedure: DISSECTION, NECK, RADICAL;  Surgeon: Jesus Oliphant, MD;  Location: [facility] OR;  Service: ENT;  Laterality: Left;   TONSILLECTOMY      SOCIAL HISTORY: Social History   Socioeconomic History   Marital status: Divorced    Spouse name: Not on file   Number of children: Not on file   Years of education: Not on file   Highest education level: Not on file  Occupational History   Not on file  Tobacco Use   Smoking status: Former   Smokeless tobacco: Never  Vaping Use   Vaping status: Never Used  Substance and Sexual Activity   Alcohol use: Yes    Alcohol/week: 1.0 standard drink of alcohol    Types: 1 Cans of beer per week   Drug use: No   Sexual activity: Not on file  Other Topics Concern   Not on file  Social History Narrative   Not on file   Social Drivers of Health   Financial Resource Strain: Not on file  Food Insecurity: No Food Insecurity (09/13/2023)   Hunger Vital Sign    Worried About Running Out of Food in the Last Year: Never true    Ran Out of Food in the Last Year: Never true  Transportation Needs: No Transportation Needs (09/13/2023)   PRAPARE - Administrator, Civil Service (Medical): No    Lack of Transportation (Non-Medical): No  Physical Activity: Not on file  Stress: Not on file  Social Connections: Moderately Integrated (09/13/2023)   Social Connection and Isolation Panel    Frequency of Communication with Friends and Family: Twice a week    Frequency of Social Gatherings with Friends and Family: Twice a week    Attends Religious Services: More than 4 times per year    Active Member of [employer] or Organizations: No    Attends Banker Meetings: Never    Marital Status: Living with partner  Intimate Partner Violence: Not At Risk (09/13/2023)   Humiliation,  Afraid, Rape, and Kick questionnaire    Fear of Current or Ex-Partner: No    Emotionally Abused: No    Physically Abused: No    Sexually Abused: No    FAMILY HISTORY: Family History  Problem Relation Age of Onset   Alzheimer's disease Mother    Hypertension Mother    Heart attack Father    Colon cancer Father    Coronary artery disease Maternal Grandfather    Heart attack Maternal Grandfather    Alzheimer's disease Paternal Grandmother    Lung cancer Paternal Grandfather    Heart disease Paternal Grandfather    Hyperlipidemia Sister     ALLERGIES:  He has no known allergies.  MEDICATIONS:  Current Outpatient Medications  Medication Sig Dispense Refill   cyanocobalamin  (VITAMIN B12) 1000 MCG tablet Take 1,000 mcg by mouth daily.     ezetimibe  (ZETIA ) 10 MG  tablet TAKE ONE TABLET BY MOUTH EVERY DAY 90 tablet 3   finasteride  (PROSCAR ) 5 MG tablet Take 5 mg by mouth daily.     ipratropium (ATROVENT ) 0.03 % nasal spray Place 1 spray into both nostrils daily.     metoprolol  tartrate (LOPRESSOR ) 25 MG tablet Take 1 tablet (25 mg total) by mouth 2 (two) times daily. 180 tablet 1   rosuvastatin  (CRESTOR ) 20 MG tablet TAKE 1 TABLET BY MOUTH ONCE DAILY 90 tablet 3   Saw Palmetto  500 MG CAPS Take 1 capsule 3 (three) times daily by mouth.     silodosin (RAPAFLO) 8 MG CAPS capsule Take 8 mg by mouth daily with breakfast.     tadalafil  (CIALIS ) 5 MG tablet Take 1 tablet by mouth daily.     No current facility-administered medications for this visit.    REVIEW OF SYSTEMS:    Review of Systems - Oncology  All other pertinent systems were reviewed with the patient and are negative.  PHYSICAL EXAMINATION:    Onc Performance Status - 10/10/23 1600       ECOG Perf Status   ECOG Perf Status Fully active, able to carry on all pre-disease performance without restriction      KPS SCALE   KPS % SCORE Normal, no compliants, no evidence of disease           Vitals:   10/06/23 0904  10/06/23 0907  BP: (!) 136/96 120/79  Pulse: 76   Resp: 17   Temp: 98.3 F (36.8 C)   SpO2: 100%    Filed Weights   10/06/23 0904  Weight: 190 lb (86.2 kg)    Physical Exam Constitutional:      General: He is not in acute distress.    Appearance: Normal appearance.  HENT:     Head: Normocephalic and atraumatic.  Eyes:     Conjunctiva/sclera: Conjunctivae normal.  Cardiovascular:     Rate and Rhythm: Normal rate and regular rhythm.  Pulmonary:     Effort: Pulmonary effort is normal. No respiratory distress.  Abdominal:     General: There is no distension.  Skin:    Comments: Sutures from recent carcinoma excision on the left side of neck/parotid area are well-healed.  Neurological:     General: No focal deficit present.     Mental Status: He is alert and oriented to person, place, and time.  Psychiatric:        Mood and Affect: Mood normal.        Behavior: Behavior normal.    LABORATORY DATA:   I have reviewed the data as listed.  No results found for any visits on 10/06/23.  Lab Results  Component Value Date   WBC 7.6 09/11/2023   HGB 13.9 09/11/2023   HCT 41.4 09/11/2023   MCV 89.4 09/11/2023   PLT 208 09/11/2023   Recent Labs    12/20/22 1439 09/11/23 1059  NA 143 140  K 4.6 3.8  CL 106 108  CO2 23 24  GLUCOSE 81 95  BUN 18 19  CREATININE 1.00 0.91  CALCIUM  9.1 9.2  GFRNONAA  --  >60  ALT 20  --      RADIOGRAPHIC STUDIES:  I have personally reviewed the radiological images as listed and agree with the findings in the report.  NM PET Image Initial (PI) Skull Base To Thigh (F-18 FDG) Result Date: 10/03/2023 CLINICAL DATA:  Initial treatment strategy for skin cancer of the face. Salivary gland biopsied last  month. Radical neck dissection 09/13/2023. EXAM: NUCLEAR MEDICINE PET SKULL BASE TO THIGH TECHNIQUE: 9.2 mCi F-18 FDG was injected intravenously. Full-ring PET imaging was performed from the skull base to thigh after the radiotracer. CT data  was obtained and used for attenuation correction and anatomic localization. Fasting blood glucose: 88 mg/dl COMPARISON:  93/96/7974 neck CT. Abdominopelvic CT 03/26/2009 also reviewed. FINDINGS: Mediastinal blood pool activity: SUV max 2.0 Liver activity: SUV max NA NECK: Within the presumed operative bed, posterior aspect of the expected location of the left parotid, is moderate hypermetabolism without well-defined residual mass. Example at a S.U.V. max of 4.6 including on 20/3/4. Deep to the left sternocleidomastoid is amorphous soft tissue thickening and low-level activity including at a S.U.V. max of 3.6 on 26/4. No well-defined adenopathy in this area. Incidental CT findings: Bilateral carotid atherosclerosis. CHEST: No areas of abnormal hypermetabolism. Incidental CT findings: Aortic and coronary artery calcification. 4 mm right lower lobe pulmonary nodule on 43/7. ABDOMEN/PELVIS: No abdominopelvic parenchymal or nodal hypermetabolism. Incidental CT findings: Interpolar left renal 3.0 cm fluid density lesion is likely a cyst . In the absence of clinically indicated signs/symptoms require(s) no independent follow-up. Abdominal aortic atherosclerosis. Scattered colonic diverticula. 12 mm dependent bladder stone. SKELETON: No abnormal marrow activity. Incidental CT findings: Convex right lumbar spine curvature. IMPRESSION: 1. Left-sided neck hypermetabolism is without well-defined correlate mass or adenopathy and likely postoperative. 2. No evidence of extracervical suspicious activity. 3. 12 mm bladder stone. 4. Incidental findings, including: Coronary artery atherosclerosis. Aortic Atherosclerosis (ICD10-I70.0). 4 mm right lower lobe pulmonary nodule, below PET resolution. Electronically Signed   By: Rockey Kilts M.D.   On: 10/03/2023 15:47    No orders of the defined types were placed in this encounter.   CODE STATUS:  Code Status History     Date Active Date Inactive Code Status Order ID Comments  User Context   09/13/2023 1247 09/14/2023 1436 Full Code [id number]  Jesus Oliphant, MD Inpatient    Questions for Most Recent Historical Code Status (Order [id number])     Question Answer   By: Other                Advance Directive Documentation    Flowsheet Row Most Recent Value  Type of Advance Directive Healthcare Power of Attorney, Living will  Pre-existing out of facility DNR order (yellow form or pink MOST form) --  MOST Form in Place? --    Future Appointments  Date Time Provider Department Center  10/18/2023  8:15 AM [facility] NURSE [facility] None  10/18/2023  9:00 AM Izell Domino, MD [facility] None  12/21/2023 10:00 AM Kate Lonni CROME, MD [facility]     I spent a total of 60 minutes during this encounter with the patient including review of chart and various tests results, discussions about plan of care and coordination of care plan.  This document was completed utilizing speech recognition software. Grammatical errors, random word insertions, pronoun errors, and incomplete sentences are an occasional consequence of this system due to software limitations, ambient noise, and hardware issues. Any formal questions or concerns about the content, text or information contained within the body of this dictation should be directly addressed to the provider for clarification.

## 2023-10-09 ENCOUNTER — Other Ambulatory Visit: Payer: Self-pay

## 2023-10-09 DIAGNOSIS — C4490 Unspecified malignant neoplasm of skin, unspecified: Secondary | ICD-10-CM

## 2023-10-09 DIAGNOSIS — R5381 Other malaise: Secondary | ICD-10-CM

## 2023-10-09 DIAGNOSIS — C770 Secondary and unspecified malignant neoplasm of lymph nodes of head, face and neck: Secondary | ICD-10-CM

## 2023-10-10 ENCOUNTER — Encounter: Payer: Self-pay | Admitting: Oncology

## 2023-10-10 NOTE — Assessment & Plan Note (Signed)
 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   Post-operative status with positive deep margin, necessitating adjuvant radiation therapy.   No evidence of metastasis, but considered locally advanced due to depth of involvement.   Immunotherapy with cemiplimab proposed post-radiation to prevent recurrence and manage any residual disease.   Discussed the mechanism of action of cemiplimab as an immunotherapy that unmasks cancer cells to the immune system. Potential side effects include immune-mediated organ damage, with thyroid  dysfunction being the most common. Severe side effects are rare, occurring in less than 0.5% of cases. The decision to use cemiplimab is based on its efficacy in preventing recurrence in similar cases and its role as a standard of care in such scenarios.  Patient is agreeable to proceeding with cemiplimab.  - Proceed with radiation therapy for 6-6.5 weeks after complete healing from surgery.  - Plan for immunotherapy with cemiplimab (Libtayo) post-radiation, administered intravenously every three weeks for a minimum of one year.  - Arrange for an education session with nursing staff to discuss side effects and provide printed information.  - Coordinate with interventional radiology for port placement around the end of radiation therapy.  - Schedule regular blood work to monitor liver function, blood counts, and thyroid  function during immunotherapy.  - Conduct follow-up imaging (CT or PET scan) every three months to monitor for recurrence or metastasis.

## 2023-10-11 ENCOUNTER — Encounter (HOSPITAL_BASED_OUTPATIENT_CLINIC_OR_DEPARTMENT_OTHER): Payer: Self-pay | Admitting: Cardiology

## 2023-10-12 ENCOUNTER — Other Ambulatory Visit: Payer: Self-pay

## 2023-10-12 ENCOUNTER — Telehealth: Payer: Self-pay

## 2023-10-12 DIAGNOSIS — C4490 Unspecified malignant neoplasm of skin, unspecified: Secondary | ICD-10-CM

## 2023-10-12 DIAGNOSIS — C770 Secondary and unspecified malignant neoplasm of lymph nodes of head, face and neck: Secondary | ICD-10-CM

## 2023-10-12 DIAGNOSIS — C44209 Unspecified malignant neoplasm of skin of left ear and external auricular canal: Secondary | ICD-10-CM

## 2023-10-12 NOTE — Telephone Encounter (Signed)
 [facility] Clinical Social Work  Clinical Social Work was referred by Statistician for assessment of psychosocial needs (new Patient protocol).  Clinical Social Worker attempted to contact patient by phone to offer support and assess for needs.  Patient at dental appointment for clearance at time of call. CSW will attempt to reach patient again. Left VM with purpose of call and direct contact.    Lizbeth Sprague, LCSW  Clinical Social Worker [facility]

## 2023-10-13 ENCOUNTER — Inpatient Hospital Stay

## 2023-10-13 NOTE — Telephone Encounter (Signed)
 Can we add him to my schedule on 9/10?

## 2023-10-13 NOTE — Progress Notes (Signed)
 [facility] Clinical Social Work  Initial Assessment   Charles Solis is a 73 y.o. year old male contacted by phone. Clinical Social Work was referred by nurse navigator for assessment of psychosocial needs.   SDOH (Social Determinants of Health) assessments performed: Yes   SDOH Screenings   Food Insecurity: No Food Insecurity (10/13/2023)  Housing: Low Risk  (10/13/2023)  Transportation Needs: No Transportation Needs (10/13/2023)  Utilities: Not At Risk (10/13/2023)  Depression (PHQ2-9): Low Risk  (10/06/2023)  Social Connections: Moderately Integrated (09/13/2023)  Tobacco Use: Medium Risk (10/10/2023)    PHQ 2/9:    10/06/2023    9:13 AM  Depression screen PHQ 2/9  Decreased Interest 0  Down, Depressed, Hopeless 0  PHQ - 2 Score 0     Distress Screen completed: No     No data to display            Family/Social Information:  Housing Arrangement: patient lives with his significant other. Family members/support persons in your life? Signe named his girlfriend, his sister, and his friends as his support system.  Transportation concerns: Signe feels comfortable driving to treatments on his own. If necessary, his significant other will step in.   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  Income source: Employment Financial concerns: No Type of concern: None Food access concerns: no Religious or spiritual practice: Signe did not name any specific beliefs, however he does have friends that he can confide in if necessary. Advanced directives: Yes-completed and uploaded Services Currently in place:  Insurance, employment, family, income, transportation  Coping/ Adjustment to diagnosis: Patient understands treatment plan and what happens next? He understands the treatment he will be going through, and the purpose.  Concerns about diagnosis and/or treatment: No specific worries related to treatment.  He describes  a high level of trust amongst his providers, and is appreciative of the support.  Patient reported stressors: No reported stressors. No initial levels of distress noted. Patient enjoys reading, being a pilot, and wood work. Current coping skills/ strengths: Ability for insight , Active sense of humor , Average or above average intelligence , Capable of independent living , Communication skills , Financial means , General fund of knowledge , Motivation for treatment/growth , Special hobby/interest , Supportive family/friends , and Work skills     SUMMARY: Current SDOH Barriers:  No SDOH barriers identified  Clinical Social Work Clinical Goal(s):  CSW will provide continued support as needed.  Interventions: Discussed common feeling and emotions when being diagnosed with head and neck cancer, and the importance of support during treatment Informed patient of the support team roles and support services at [facility], specifically head and neck support group and alight peer mentors. Provided CSW contact information and encouraged patient to call with any questions or concerns  Follow Up Plan: CSW will follow-up with patient by phone     Lizbeth Sprague, LCSW Clinical Social Worker [facility]

## 2023-10-16 DIAGNOSIS — Z0120 Encounter for dental examination and cleaning without abnormal findings: Secondary | ICD-10-CM | POA: Diagnosis not present

## 2023-10-16 NOTE — Progress Notes (Addendum)
 [facility]  10/16/23 No primary care provider on file. Treatment Providers Dr. Willye Manas  A virtual visit was used. this real-time, interactive virtual clinical encounter was conducted using Engineer, drilling. The patient participated in the visit from [facility]. Consent for virtual care, including informing the patient that insurance will be billed, and that in-person care is available in case of emergencies or as needed otherwise, was discussed at the time of scheduling.  Dental procedures in this visit  . I4013 - FLUORIDE GEL CARRIER Max (Completed)    Service provider: Willye Manas, DDS MS    Billing provider: Willye Manas, DDS MS    HEALTH HISTORY ? Vitals:  BP Readings from Last 1 Encounters:  10/12/23 138/79    Pulse:    Medical history was reviewed and updated. No contraindication to care. Medical History[1] Surgical History[2] Social History   Tobacco Use  . Smoking status: Not on file  . Smokeless tobacco: Not on file  Substance Use Topics  . Alcohol use: Not on file   Family History[3] Medications Ordered Prior to Encounter[4] Medical Risk Assessment ASA GRADE: ASA 4 - Patient with severe systemic disease that is a constant threat to life  Subjective: Charles Solis with SCC L Parotid presents with appt to have impressions made for scatter protection devices prior to starting Head and Neck Radiation Therapy.   History of presenting condition: Patient came in to the office for impressions for SPDs. He will start Head and Neck Radiation Therapy with a tentative date of 10-31-23.   ?Pain score currently: 0/10  Objective:  Radiographs taken: No radiographs captured at this appointment.  ROS: all other systems negative for symptoms Intraoral exam completed and findings: After looking at radiographs and doing an intraoral exam it was found that #18 and #19 both have PFM crowns and have large gaps at the margin of the crowns on the  mesial and distal surfaces. No decay present at this time  Extraoral exam completed and findings:  Perio findings:  Additional observations:   ASSESSMENT? 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  Teeth #s 18 and 19 with PFM crowns previously evaluated by Dr. Darl and found to have large gaps at the mesial and distal margins but without any recurrent caries. Discussed because there is no sign of decay/caries, re-fabrication of crowns can be deferred at this time so that his HNRT can be started on timely manner. Discussed the oral complications of radiation therapy including mucositis, hyposalivation, high caries risk and risk of osteoradionecrosis of the jaw and that any extractions after radiotherapy in the future need to be done with antibiotic coverage.  Procedure: Impressions made of maxillary and mandibular arches for fabrication of radiation mouthguards. Radiation upper and lower mouthguards/ scatter prevention devices delivered.  ?? PLAN? Patient received literature on effects of radiation on the mouth and jawbones. Wear the radiation mouthguards/scatter prevention devices during the radiation therapy sessions. After completion of all the 6 weeks of radiation therapy treatment, these mouthguards can be used as fluoride trays. Apply thin layer of fluoride gel inside the trays and wear them for 20 minutes nightly. Prescribed high fluoride tooth gel to be started after completion of RT. Patient can follow-up with his general dentist in the future after completion of HNRT for re-fabrication of crown #s 18 and 19 Return for a follow-up on as  needed basis. Patient knows to contact me with questions or concerns  Treatment Providers Dr. Willye Manas Assistant: Izetta Silvan  [facility] D[facility] [address]  [place] [address] [phone]        [1] No past medical history on file. [2] No past surgical history on file. [3] No family history on file. [4] Current Outpatient Medications on File Prior to Visit  Medication Sig Dispense Refill  . cyanocobalamin  (VITAMIN B-12) 500 mcg tablet Take 1,000 mcg by mouth in the morning.    . ezetimibe  (ZETIA ) 10 mg tablet Take 10 mg by mouth 1 (one) time each day.    . fexofenadine-pseudoephedrine (Allegra-D 24 Hour) 180-240 mg 24 hr tablet Take by mouth 1 (one) time each day.    . finasteride  (PROSCAR ) 5 mg tablet Take 5 mg by mouth 1 (one) time each day.    . ipratropium (ATROVENT ) 21 mcg (0.03 %) nasal spray Administer 2 sprays into each nostril 1 (one) time each day.    . mecobalamin, vitamin B12, 1,000 mcg tablet,chewable Chew 2 tablets 1 (one) time each day.    . metoprolol  tartrate (LOPRESSOR ) 25 mg tablet Take 25 mg by mouth in the morning and 25 mg in the evening.    . rosuvastatin  (CRESTOR ) 20 mg tablet Take 20 mg by mouth 1 (one) time each day.    . saw palmetto  450 mg capsule Take 450 mg by mouth 1 (one) time each day.    . silodosin 8 mg capsule Take 8 mg by mouth 1 (one) time each day.    . tadalafiL  (CIALIS ) 5 mg tablet Take 5 mg by mouth 1 (one) time each day.     No current facility-administered medications on file prior to visit.

## 2023-10-17 ENCOUNTER — Other Ambulatory Visit: Payer: Self-pay

## 2023-10-17 DIAGNOSIS — C770 Secondary and unspecified malignant neoplasm of lymph nodes of head, face and neck: Secondary | ICD-10-CM

## 2023-10-17 DIAGNOSIS — R5381 Other malaise: Secondary | ICD-10-CM

## 2023-10-17 DIAGNOSIS — C4490 Unspecified malignant neoplasm of skin, unspecified: Secondary | ICD-10-CM

## 2023-10-18 ENCOUNTER — Ambulatory Visit
Admission: RE | Admit: 2023-10-18 | Discharge: 2023-10-18 | Disposition: A | Discharge status: Home / Self Care | Source: Ambulatory Visit | Attending: Radiation Oncology | Admitting: Radiation Oncology

## 2023-10-18 ENCOUNTER — Ambulatory Visit
Admission: RE | Admit: 2023-10-18 | Discharge: 2023-10-18 | Disposition: A | Discharge status: Home / Self Care | Source: Ambulatory Visit | Attending: Oncology | Admitting: Oncology

## 2023-10-18 VITALS — BP 124/82 | HR 73 | Temp 97.6°F | Resp 18 | Ht 73.0 in | Wt 192.0 lb

## 2023-10-18 DIAGNOSIS — C770 Secondary and unspecified malignant neoplasm of lymph nodes of head, face and neck: Secondary | ICD-10-CM | POA: Diagnosis not present

## 2023-10-18 DIAGNOSIS — Z79899 Other long term (current) drug therapy: Secondary | ICD-10-CM | POA: Diagnosis not present

## 2023-10-18 DIAGNOSIS — R5381 Other malaise: Secondary | ICD-10-CM

## 2023-10-18 DIAGNOSIS — Z510 Encounter for antineoplastic radiation therapy: Secondary | ICD-10-CM | POA: Insufficient documentation

## 2023-10-18 DIAGNOSIS — C44329 Squamous cell carcinoma of skin of other parts of face: Secondary | ICD-10-CM | POA: Diagnosis present

## 2023-10-18 DIAGNOSIS — C44209 Unspecified malignant neoplasm of skin of left ear and external auricular canal: Secondary | ICD-10-CM | POA: Diagnosis not present

## 2023-10-18 DIAGNOSIS — C4490 Unspecified malignant neoplasm of skin, unspecified: Secondary | ICD-10-CM

## 2023-10-18 LAB — TSH: TSH: 1.1 u[IU]/mL (ref 0.350–4.500)

## 2023-10-18 LAB — BUN & CREATININE (CHCC)
BUN: 25 mg/dL — ABNORMAL HIGH (ref 8–23)
Creatinine: 0.77 mg/dL (ref 0.61–1.24)
GFR, Estimated: >60 mL/min (ref >60)

## 2023-10-18 NOTE — Progress Notes (Signed)
 Oncology Nurse Navigator Documentation   To provide support, encouragement and care continuity, met with Charles Solis during his CT SIM. He was accompanied by his friend Devere. He knows to present to the cancer center tomorrow at 9:00 for his appointment with PT during head and neck MDC. He tolerated procedure without difficulty, denied questions/concerns.     I encouraged him to call me with any questions or concerns in the future.   Delon Jefferson RN, BSN, OCN Head & Neck Oncology Nurse Navigator [facility] Phone # [phone]  Fax # [phone]

## 2023-10-18 NOTE — Progress Notes (Signed)
 Has armband been applied?  Yes.    Does patient have an allergy to IV contrast dye?: No.   Has patient ever received premedication for IV contrast dye?: No.   Date of lab work: 10/18/2023 BUN: 25 CR: 0.77 eGFR: >60  Does patient take metformin?: No.  Is eGFR >60?: Yes.   If no, when can patient resume? (Must be 48 hrs AFTER they receive IV contrast):    IV site: forearm left, condition patent and no redness  Has IV site been added to flowsheet?  Yes.    BP 124/82 (BP Location: Left Arm, Patient Position: Sitting, Cuff Size: Large)   Pulse 73   Temp 97.6 F (36.4 C)   Resp 18   Ht 6' 1 (1.854 m)   Wt 192 lb (87.1 kg)   SpO2 100%   BMI 25.33 kg/m

## 2023-10-19 ENCOUNTER — Other Ambulatory Visit: Payer: Self-pay

## 2023-10-19 ENCOUNTER — Encounter: Payer: Self-pay | Admitting: Physical Therapy

## 2023-10-19 ENCOUNTER — Ambulatory Visit: Attending: Radiation Oncology | Admitting: Physical Therapy

## 2023-10-19 DIAGNOSIS — R293 Abnormal posture: Secondary | ICD-10-CM | POA: Insufficient documentation

## 2023-10-19 DIAGNOSIS — C770 Secondary and unspecified malignant neoplasm of lymph nodes of head, face and neck: Secondary | ICD-10-CM | POA: Insufficient documentation

## 2023-10-19 DIAGNOSIS — C44209 Unspecified malignant neoplasm of skin of left ear and external auricular canal: Secondary | ICD-10-CM | POA: Insufficient documentation

## 2023-10-19 DIAGNOSIS — C4490 Unspecified malignant neoplasm of skin, unspecified: Secondary | ICD-10-CM | POA: Diagnosis present

## 2023-10-19 NOTE — Therapy (Signed)
 OUTPATIENT PHYSICAL THERAPY HEAD AND NECK BASELINE EVALUATION   Patient Name: Charles Solis MRN: [id number] DOB:26-Sep-1950, 73 y.o., male Today's Date: 10/19/2023  END OF SESSION:  PT End of Session - 10/19/23 9061     Visit Number 1    Number of Visits 2    Date for PT Re-Evaluation 01/11/24    PT Start Time 0902    PT Stop Time 0933    PT Time Calculation (min) 31 min    Activity Tolerance Patient tolerated treatment well    Behavior During Therapy [facility] for tasks assessed/performed          Past Medical History:  Diagnosis Date   High cholesterol    Hypothyroidism    Prostate cancer [facility])    Past Surgical History:  Procedure Laterality Date   HERNIA REPAIR     inguinal   MOHS SURGERY     head,ear,cheek,nose   OTOPLASATY Left 09/13/2023   Procedure: OTOPLASTY;  Surgeon: Jesus Oliphant, MD;  Location: [facility] OR;  Service: ENT;  Laterality: Left;  Left wedge resection of pinna with primary closure.   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   Sensorineural hearing loss, bilateral 05/23/2023   HTN (hypertension) 11/16/2018   Hyperlipidemia 09/24/2014    PCP: None  REFERRING PROVIDER: Izell Domino, MD  REFERRING DIAG: C44.209 (ICD-10-CM) - Cancer of skin of left ear C44.90 (ICD-10-CM) - Skin cancer C77.0 (ICD-10-CM) - Secondary malignant neoplasm of cervical lymph node (HCC)  THERAPY DIAG:  Cancer of skin of left ear - Plan: PT plan of care cert/re-cert  Abnormal posture - Plan: PT plan of care  cert/re-cert  Skin cancer - Plan: PT plan of care cert/re-cert  Secondary malignant neoplasm of cervical lymph node (HCC) - Plan: PT plan of care cert/re-cert  Rationale for Evaluation and Treatment: Rehabilitation  ONSET DATE: 08/31/23  SUBJECTIVE:     SUBJECTIVE STATEMENT: Patient reports they are here today to be seen by their medical team for newly diagnosed cutaneous cancer of head and neck.    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 A few months ago he developed a lesion located on his left jaw area. Lesion had continued to increase in size. To investigate his symptoms he underwent a CT neck on 07/18/23 showing an ovoid centrally hypodense lesion present posteriorly within or abutting the superficial lobe of the left parotid, measuring approximately 22 x 17 x 18 mm.  08/31/23 He underwent a FNA. Pathology indicated the presence of malignant cells, carcinoma with squamous differentiation with no lymphoid tissue identified. 09/04/23 He presented to Dr. Jesus for second opinion. Based on his recommendations, patient underwent a parotidectomy with radical neck dissection on 09/13/23. Surgical pathology redemonstrated tumor size of 3.5 x 3.2 x 2.3 cm and histology of invasive moderately differentiated squamous cell carcinoma with tumor present in dermis and infiltrates the subcutaneous adipose to superficially invade the underlying parotid gland. Tumor present in 3:00 margin but  is negative for angiolymphatic and perineural invasion. Neck dissection pathology of 20 lymph nodes are negative for carcinoma. 09/28/23 PET showing no evidence of metastatic disease. 10/03/23 Consult with Dr. Izell. 10/06/23 Consult with Dr. Autumn. He will receive radiation then after it's completed immunotherapy with Libtayo every 3 weeks for one year. He will receive 33 fractions of radiation to his left parotid which will start on 10/25/23 and will complete 12/11/23.  PATIENT  GOALS:   to be educated about the signs and symptoms of lymphedema and learn post op HEP.   PAIN:  Are you having pain? Yes: NPRS scale: 1-2/10 Pain location: L shoulder Pain description: deep Aggravating factors: raising arm Relieving factors: just resting the arm  PRECAUTIONS: Active CA  RED FLAGS: None   WEIGHT BEARING RESTRICTIONS: No  FALLS:  Has patient fallen in last 6 months? No Does the patient have a fear of falling that limits activity? No Is the patient reluctant to leave the house due to a fear of falling?No  LIVING ENVIRONMENT: Patient lives with: wife Lives in: House/apartment Has following equipment at home: Single point cane, Environmental consultant - 2 wheeled, Crutches, and bed side commode  OCCUPATION: part time job doing Herbalist: works out daily 10-15 min on elliptical, light weights, stretching  PRIOR LEVEL OF FUNCTION: Independent   OBJECTIVE: Note: Objective measures were completed at Evaluation unless otherwise noted.  COGNITION: Overall cognitive status: Within functional limits for tasks assessed                  POSTURE:  Forward head and rounded shoulders posture  30 SEC SIT TO STAND: 13 reps in 30 sec without use of UEs which is  Good for patient's age  SHOULDER AROM:   WFL some discomfort in the L shoulder at end range, pt thinks he may have injured it lifting something heavy   CERVICAL AROM:   Percent limited  Flexion WFL  Extension WFL  Right lateral flexion WFL  Left lateral flexion 25% limited  Right rotation WFL  Left rotation 25% limited    (Blank rows=not tested)  GAIT: Assessed: Yes Assistance needed: Independent Ambulation Distance: 10 feet Assistive Device: none Gait pattern: narrow base of support Ambulation surface: Level  PATIENT EDUCATION:  Education details: Neck ROM, importance of posture when sitting, standing and lying down, deep breathing, walking program and importance of staying active  throughout treatment, CURE article on staying active, Why exercise? flyer, lymphedema and PT info Person educated: Patient Education method: Explanation, Demonstration, Handout Education comprehension: Patient verbalized understanding and returned demonstration  HOME EXERCISE PROGRAM: Patient was instructed today in a home exercise program today for head and neck range of motion exercises. These included active cervical flexion, active cervical extension, active cervical rotation to each direction, upper trap stretch, and shoulder retraction. Patient was encouraged to do these 2-3 times a day, holding for 5 sec each and completing for 5 reps. Pt was educated that once this becomes easier then hold the stretches for 30-60 seconds.    ASSESSMENT:  CLINICAL IMPRESSION: Pt arrives to PT with recently diagnosed cutaneous carcinoma of head and neck. Pt will undergo cutaneous carcinoma of head and neck. He will receive 33 fractions of radiation to his left parotid which will start on 10/25/23 and will complete 12/11/23. Pt's cervical ROM was [facility] in all directions except for L lateral flexion and rotation which were 25% limited. Educated pt about signs and symptoms of lymphedema as well as anatomy  and physiology of lymphatic system. Educated pt in importance of staying as active as possible throughout treatment to decrease fatigue as well as head and neck ROM exercises to decrease loss of ROM. Will see pt after completion of radiation to reassess ROM and assess for lymphedema and to determine therapy needs at that time.  Pt will benefit from skilled therapeutic intervention to improve on the following deficits: Decreased knowledge of precautions and postural dysfunction.  PT treatment/interventions: ADL/self-care home management, pt/family education, therapeutic exercise. Other interventions 97164- PT Re-evaluation, 97110-Therapeutic exercises, 97530- Therapeutic activity, [id number]- Neuromuscular re-education,  97535- Self Care, 02859- Manual therapy, 97760- Orthotic Initial, and [id number]- Orthotic/Prosthetic subsequent  REHAB POTENTIAL: Good  CLINICAL DECISION MAKING: Evolving/moderate complexity  EVALUATION COMPLEXITY: Moderate   GOALS: Goals reviewed with patient? YES  LONG TERM GOALS: (STG=LTG)   Name Target Date  Goal status  1 Patient will be able to verbalize understanding of a home exercise program for cervical range of motion, posture, and walking.   Baseline:  No knowledge 10/19/2023 Achieved at eval  2 Patient will be able to verbalize understanding of proper sitting and standing posture. Baseline:  No knowledge 10/19/2023 Achieved at eval  3 Patient will be able to verbalize understanding of lymphedema risk and availability of treatment for this condition Baseline:  No knowledge 10/19/2023 Achieved at eval  4 Pt will demonstrate a return to full cervical ROM and function post operatively compared to baselines and not demonstrate any signs or symptoms of lymphedema.  Baseline: See objective measurements taken today. 01/11/2024 New    PLAN:  PT FREQUENCY/DURATION: EVAL and 1 follow up appointment.   PLAN FOR NEXT SESSION: will reassess 2 weeks after completion of radiation to determine needs.  Patient will follow up at outpatient cancer rehab 2 weeks after completion of radiation.  If the patient requires physical therapy at that time, a specific plan will be dictated and sent to the referring physician for approval. The patient was educated today on appropriate basic range of motion exercises to begin now and continue throughout radiation and educated on the signs and symptoms of lymphedema. Patient verbalized good understanding.     Physical Therapy Information for During and After Head/Neck Cancer Treatment: Lymphedema is a swelling condition that you may be at risk for in your neck and/or face if you have radiation treatment to the area and/or if you have surgery that includes  removing lymph nodes.  There is treatment available for this condition and it is not life-threatening.  Contact your physician or physical therapist with concerns. An excellent resource for those seeking information on lymphedema is the National Lymphedema Network's website.  It can be accessed at www.lymphnet.org If you notice swelling in your neck or face at any time following surgery (even if it is many years from now), please contact your doctor or physical therapist to discuss this.  Lymphedema can be treated at any time but it is easier for you if it is treated early on. If you have had surgery to your neck, please check with your surgeon about how soon to start doing neck range of motion exercises.  If you are not having surgery, I encourage you to start doing neck range of motion exercises today and continue these while undergoing treatment, UNLESS you have irritation of your skin or soft tissue that is aggravated by doing them.  These exercises are intended to help you prevent loss of range of motion and/or to gain range of motion in your  neck (which can be limited by tightening effects of radiation), and NOT to aggravate these tissues if they develop sensitivities from treatment. Neck range of motion exercises should be done to the point of feeling a GENTLE, TOLERABLE stretch only.  You are encouraged to start a walking or other exercise program tomorrow and continue this as much as you are able through and after treatment.  Please feel free to call me with any questions. Florina Lanis Carbon, PT, CLT Physical Therapist and Certified Lymphedema Therapist [facility] [address], [place], [place] [address] [phone] [email or web address]  WALKING  Walking is a great form of exercise to increase your strength, endurance and overall fitness.  A walking program can help you start slowly and gradually build endurance as you go.  Everyone's ability  is different, so each person's starting point will be different.  You do not have to follow them exactly.  The are just samples. You should simply find out what's right for you and stick to that program.   In the beginning, you'll start off walking 2-3 times a day for short distances.  As you get stronger, you'll be walking further at just 1-2 times per day.  A. You Can Walk For A Certain Length Of Time Each Day    Walk 5 minutes 3 times per day.  Increase 2 minutes every 2 days (3 times per day).  Work up to 25-30 minutes (1-2 times per day).   Example:   Day 1-2 5 minutes 3 times per day   Day 7-8 12 minutes 2-3 times per day   Day 13-14 25 minutes 1-2 times per day  B. You Can Walk For a Certain Distance Each Day     Distance can be substituted for time.    Example:   3 trips to mailbox (at road)   3 trips to corner of block   3 trips around the block  C. Go to local high school and use the track.    Walk for distance ____ around track  Or time ____ minutes  D. Walk ____ Jog ____ Run ___   Why exercise?  So many benefits! Here are SOME of them: Heart health, including raising your good cholesterol level and reducing heart rate and blood pressure Lung health, including improved lung capacity It burns fats, and most of us  can stand to be leaner, whether or not we are overweight. It increases the body's natural painkillers and mood elevators, so makes you feel better. Not only makes you feel better, but look better too Improves sleep Takes a bite out of stress May decrease your risk of many types of cancer If you are currently undergoing cancer treatment, exercise may improve your ability to tolerate treatments including chemotherapy. For everybody, it can improve your energy level. Those with cancer-related fatigue report a 40-50% reduction in this symptom when exercising regularly. If you are a survivor of breast, colon, or prostate cancer, it may decrease your risk of a  recurrence. (This may hold for other cancers too, but so far we have data just for these three types.)  How to exercise: Get your doctor's okay. Pick something you enjoy doing, like walking, Zumba, biking, swimming, or whatever. Start at low intensity and time, then gradually increase.  (See walking program handout.) Set a goal to achieve over time.  The American Cancer Society, American Heart Association, and U.S. Dept. of Health and Human Services recommend 150 minutes of moderate exercise, 75  minutes of vigorous exercise, or a combination of both per week. This should be done in episodes at least 10 minutes long, spread throughout the week.  Need help being motivated? Pick something you enjoy doing, because you'll be more inclined to stick with that activity than something that feels like a chore. Do it with a friend so that you are accountable to each other. Schedule it into your day. Place it on your calendar and keep that appointment just like you do any appointment that you make. Join an exercise group that meets at a specific time.  That way, you have to show up on time, and that makes it harder to procrastinate about doing your workout.  It also keeps you accountable--people begin to expect you to be there. Join a gym where you feel comfortable and not intimidated, at the right cost. Sign up for something that you'll need to be in shape for on a specific date, like a 1K or a 5K to walk or run, a 20 or 30 mile bike ride, a mud run or something like that. If the date is looming, you know you'll need to train to be ready for it.  An added benefit is that many of these are fundraisers for good causes. If you've already paid for a gym membership, group exercise class or event, you might as well work out, so you haven't wasted your money!    [facility], PT 10/19/2023, 9:52 AM

## 2023-10-20 NOTE — Addendum Note (Signed)
 Encounter addended by: Izell Domino, MD on: 10/20/2023 3:23 PM  Actions taken: Clinical Note Signed

## 2023-10-22 NOTE — Progress Notes (Unsigned)
 Cardiology Office Note:    Date:  10/22/2023   ID:  Charles Solis, DOB 1950/11/09, MRN [id number]  PCP:  No primary care provider on file.  Cardiologist:  Aleene Passe, MD (Inactive)  Electrophysiologist:  None   Referring MD: No ref. provider found   No chief complaint on file. ***  History of Present Illness:    Charles Solis is a 73 y.o. male with a hx of hyperlipidemia, prostate cancer who presents for follow-up.  Previously followed with Dr. Passe.  Calcium  score 09/29/2014 was 50 (48th percentile).  Echocardiogram 09/2023 showed EF 55 to 60%, G1 DD, normal RV function, no significant valvular disease.  Zio patch x 3 days 08/2022 showed 11 episodes of SVT with longest lasting 8 seconds with average heart rate 171 bpm.  Since last clinic visit,  Past Medical History:  Diagnosis Date   High cholesterol    Hypothyroidism    Prostate cancer [facility])     Past Surgical History:  Procedure Laterality Date   HERNIA REPAIR     inguinal   MOHS SURGERY     head,ear,cheek,nose   OTOPLASATY Left 09/13/2023   Procedure: OTOPLASTY;  Surgeon: Jesus Oliphant, MD;  Location: [facility] OR;  Service: ENT;  Laterality: Left;  Left wedge resection of pinna with primary closure.   PAROTIDECTOMY Left 09/13/2023   Procedure: EXCISION, PAROTID GLAND;  Surgeon: Jesus Oliphant, MD;  Location: [facility] OR;  Service: ENT;  Laterality: Left;   RADICAL NECK DISSECTION Left 09/13/2023   Procedure: DISSECTION, NECK, RADICAL;  Surgeon: Jesus Oliphant, MD;  Location: [facility] OR;  Service: ENT;  Laterality: Left;   TONSILLECTOMY      Current Medications: No outpatient medications have been marked as taking for the 10/25/23 encounter (Appointment) with Kate Lonni CROME, MD.     Allergies:   Patient has no known allergies.   Social History   Socioeconomic History   Marital status: Divorced    Spouse name: Not on file   Number of children: Not on file   Years of education: Not on file   Highest education level: Not on  file  Occupational History   Not on file  Tobacco Use   Smoking status: Former   Smokeless tobacco: Never  Vaping Use   Vaping status: Never Used  Substance and Sexual Activity   Alcohol use: Yes    Alcohol/week: 1.0 standard drink of alcohol    Types: 1 Cans of beer per week   Drug use: No   Sexual activity: Not on file  Other Topics Concern   Not on file  Social History Narrative   Not on file   Social Drivers of Health   Financial Resource Strain: Not on file  Food Insecurity: No Food Insecurity (10/13/2023)   Hunger Vital Sign    Worried About Running Out of Food in the Last Year: Never true    Ran Out of Food in the Last Year: Never true  Transportation Needs: No Transportation Needs (10/13/2023)   PRAPARE - Administrator, Civil Service (Medical): No    Lack of Transportation (Non-Medical): No  Physical Activity: Not on file  Stress: Not on file  Social Connections: Moderately Integrated (09/13/2023)   Social Connection and Isolation Panel    Frequency of Communication with Friends and Family: Twice a week    Frequency of Social Gatherings with Friends and Family: Twice a week    Attends Religious Services: More than 4 times per year  Active Member of Clubs or Organizations: No    Attends Banker Meetings: Never    Marital Status: Living with partner     Family History: The patient's ***family history includes Alzheimer's disease in his mother and paternal grandmother; Colon cancer in his father; Coronary artery disease in his maternal grandfather; Heart attack in his father and maternal grandfather; Heart disease in his paternal grandfather; Hyperlipidemia in his sister; Hypertension in his mother; Lung cancer in his paternal grandfather.  ROS:   Please see the history of present illness.    *** All other systems reviewed and are negative.  EKGs/Labs/Other Studies Reviewed:    The following studies were reviewed today: ***  EKG:  EKG  is *** ordered today.  The ekg ordered today demonstrates ***  Recent Labs: 12/20/2022: ALT 20 09/11/2023: Hemoglobin 13.9; Platelets 208; Potassium 3.8; Sodium 140 10/18/2023: BUN 25; Creatinine 0.77; TSH 1.100  Recent Lipid Panel    Component Value Date/Time   CHOL 115 12/20/2022 1439   CHOL 170 07/21/2015 0916   TRIG 79 12/20/2022 1439   TRIG 61 12/21/2016 0955   HDL 44 12/20/2022 1439   HDL 51 12/21/2016 0955   CHOLHDL 2.6 12/20/2022 1439   CHOLHDL 3.0 07/21/2015 0916   LDLCALC 55 12/20/2022 1439   LDLCALC 95 07/21/2015 0916    Physical Exam:    VS:  There were no vitals taken for this visit.    Wt Readings from Last 3 Encounters:  10/18/23 192 lb (87.1 kg)  10/06/23 190 lb (86.2 kg)  10/03/23 188 lb 12.8 oz (85.6 kg)     GEN: *** Well nourished, well developed in no acute distress HEENT: Normal NECK: No JVD; No carotid bruits LYMPHATICS: No lymphadenopathy CARDIAC: ***RRR, no murmurs, rubs, gallops RESPIRATORY:  Clear to auscultation without rales, wheezing or rhonchi  ABDOMEN: Soft, non-tender, non-distended MUSCULOSKELETAL:  No edema; No deformity  SKIN: Warm and dry NEUROLOGIC:  Alert and oriented x 3 PSYCHIATRIC:  Normal affect   ASSESSMENT:    No diagnosis found. PLAN:    DOE: Echocardiogram 09/2023 showed EF 55 to 60%, G1 DD, normal RV function, no significant valvular disease.  SVT: Zio patch x 3 days 08/2022 showed 11 episodes of SVT with longest lasting 8 seconds with average heart rate 171 bpm.  Echocardiogram 09/2023 showed EF 55 to 60%, G1 DD, normal RV function, no significant valvular disease. - Continue metoprolol  25 mg twice daily  Hyperlipidemia: On rosuvastatin  20 mg daily and Zetia  10 mg daily.  Calcium  score 09/29/2014 was 50 (48th percentile).     RTC in ***     Medication Adjustments/Labs and Tests Ordered: Current medicines are reviewed at length with the patient today.  Concerns regarding medicines are outlined above.  No orders of  the defined types were placed in this encounter.  No orders of the defined types were placed in this encounter.   There are no Patient Instructions on file for this visit.   Signed, Lonni LITTIE Nanas, MD  10/22/2023 1:26 PM     [facility]

## 2023-10-24 ENCOUNTER — Other Ambulatory Visit: Payer: Self-pay | Admitting: Urology

## 2023-10-25 ENCOUNTER — Ambulatory Visit
Admission: RE | Admit: 2023-10-25 | Discharge: 2023-10-25 | Disposition: A | Discharge status: Home / Self Care | Source: Ambulatory Visit | Attending: Radiation Oncology | Admitting: Radiation Oncology

## 2023-10-25 ENCOUNTER — Ambulatory Visit (INDEPENDENT_AMBULATORY_CARE_PROVIDER_SITE_OTHER): Admitting: Cardiology

## 2023-10-25 ENCOUNTER — Other Ambulatory Visit: Payer: Self-pay

## 2023-10-25 ENCOUNTER — Encounter (HOSPITAL_BASED_OUTPATIENT_CLINIC_OR_DEPARTMENT_OTHER): Payer: Self-pay | Admitting: Cardiology

## 2023-10-25 VITALS — BP 118/70 | HR 71 | Resp 17 | Ht 73.0 in | Wt 188.0 lb

## 2023-10-25 DIAGNOSIS — C44329 Squamous cell carcinoma of skin of other parts of face: Secondary | ICD-10-CM | POA: Diagnosis not present

## 2023-10-25 DIAGNOSIS — I471 Supraventricular tachycardia, unspecified: Secondary | ICD-10-CM

## 2023-10-25 DIAGNOSIS — H2513 Age-related nuclear cataract, bilateral: Secondary | ICD-10-CM | POA: Diagnosis not present

## 2023-10-25 DIAGNOSIS — C770 Secondary and unspecified malignant neoplasm of lymph nodes of head, face and neck: Secondary | ICD-10-CM | POA: Diagnosis not present

## 2023-10-25 DIAGNOSIS — I2510 Atherosclerotic heart disease of native coronary artery without angina pectoris: Secondary | ICD-10-CM

## 2023-10-25 DIAGNOSIS — H5203 Hypermetropia, bilateral: Secondary | ICD-10-CM | POA: Diagnosis not present

## 2023-10-25 DIAGNOSIS — H52203 Unspecified astigmatism, bilateral: Secondary | ICD-10-CM | POA: Diagnosis not present

## 2023-10-25 DIAGNOSIS — R0609 Other forms of dyspnea: Secondary | ICD-10-CM | POA: Diagnosis not present

## 2023-10-25 DIAGNOSIS — Z79899 Other long term (current) drug therapy: Secondary | ICD-10-CM | POA: Diagnosis not present

## 2023-10-25 DIAGNOSIS — R42 Dizziness and giddiness: Secondary | ICD-10-CM | POA: Diagnosis not present

## 2023-10-25 DIAGNOSIS — Z510 Encounter for antineoplastic radiation therapy: Secondary | ICD-10-CM | POA: Diagnosis not present

## 2023-10-25 DIAGNOSIS — H35371 Puckering of macula, right eye: Secondary | ICD-10-CM | POA: Diagnosis not present

## 2023-10-25 DIAGNOSIS — C44209 Unspecified malignant neoplasm of skin of left ear and external auricular canal: Secondary | ICD-10-CM | POA: Diagnosis not present

## 2023-10-25 LAB — RAD ONC ARIA SESSION SUMMARY
Course Elapsed Days: 0
Plan Fractions Treated to Date: 1
Plan Prescribed Dose Per Fraction: 2 Gy
Plan Total Fractions Prescribed: 30
Plan Total Prescribed Dose: 60 Gy
Reference Point Dosage Given to Date: 2 Gy
Reference Point Session Dosage Given: 2 Gy
Session Number: 1

## 2023-10-25 NOTE — Addendum Note (Signed)
 Encounter addended by: Izell Domino, MD on: 10/25/2023 7:34 AM  Actions taken: Clinical Note Signed

## 2023-10-25 NOTE — Patient Instructions (Addendum)
 Medication Instructions:  START ASPIRIN 81 MG DAILY   HOLD YOUR METOPROLOL  NIGHT BEFORE AND MORNING OF STRESS TEST   *If you need a refill on your cardiac medications before your next appointment, please call your pharmacy*  Lab Work: NONE If you have labs (blood work) drawn today and your tests are completely normal, you will receive your results only by: MyChart Message (if you have MyChart) OR A paper copy in the mail If you have any lab test that is abnormal or we need to change your treatment, we will call you to review the results.  Testing/Procedures: Your physician has requested that you have en exercise stress myoview. For further information please visit [email or web address] Please follow instruction sheet, as given.  Your physician has requested that you have a carotid duplex. This test is an ultrasound of the carotid arteries in your neck. It looks at blood flow through these arteries that supply the brain with blood. Allow one hour for this exam. There are no restrictions or special instructions.  Follow-Up: At [facility], you and your health needs are our priority.  As part of our continuing mission to provide you with exceptional heart care, our providers are all part of one team.  This team includes your primary Cardiologist (physician) and Advanced Practice Providers or APPs (Physician Assistants and Nurse Practitioners) who all work together to provide you with the care you need, when you need it.  Your next appointment:   6 month(s)  Provider:   DR KATE OR APP AT EITHER [facility]   We recommend signing up for the patient portal called MyChart.  Sign up information is provided on this After Visit Summary.  MyChart is used to connect with patients for Virtual Visits (Telemedicine).  Patients are able to view lab/test results, encounter notes, upcoming appointments, etc.  Non-urgent messages can be sent to your provider as well.   To learn  more about what you can do with MyChart, go to [email or web address].   Other Instructions   The test will take approximately 3 to 4 hours to complete; you may bring reading material.  If someone comes with you to your appointment, they will need to remain in the main lobby due to limited space in the testing area. **If you are pregnant or breastfeeding, please notify the nuclear lab prior to your appointment**  You will need to hold the following medications prior to your stress test: beta-blockers (24 hours prior to test)   How to prepare for your Myocardial Perfusion Test: Do not eat or drink 3 hours prior to your test, except you may have water. Do not consume products containing caffeine (regular or decaffeinated) 12 hours prior to your test. (ex: coffee, chocolate, sodas, tea). Do wear comfortable clothes (no dresses or overalls) and walking shoes, tennis shoes preferred (No heels or open toe shoes are allowed). Do NOT wear cologne, perfume, aftershave, or lotions (deodorant is allowed). If these instructions are not followed, your test will have to be rescheduled. HOLD YOUR METOPROLOL  NIGHT BEFORE OR AND MORNING OF TEST

## 2023-10-26 ENCOUNTER — Ambulatory Visit
Admission: RE | Admit: 2023-10-26 | Discharge: 2023-10-26 | Disposition: A | Discharge status: Home / Self Care | Source: Ambulatory Visit | Attending: Radiation Oncology | Admitting: Radiation Oncology

## 2023-10-26 ENCOUNTER — Other Ambulatory Visit: Payer: Self-pay

## 2023-10-26 DIAGNOSIS — C44209 Unspecified malignant neoplasm of skin of left ear and external auricular canal: Secondary | ICD-10-CM | POA: Diagnosis not present

## 2023-10-26 DIAGNOSIS — Z79899 Other long term (current) drug therapy: Secondary | ICD-10-CM | POA: Diagnosis not present

## 2023-10-26 DIAGNOSIS — Z510 Encounter for antineoplastic radiation therapy: Secondary | ICD-10-CM | POA: Diagnosis not present

## 2023-10-26 DIAGNOSIS — C770 Secondary and unspecified malignant neoplasm of lymph nodes of head, face and neck: Secondary | ICD-10-CM | POA: Diagnosis not present

## 2023-10-26 DIAGNOSIS — C44329 Squamous cell carcinoma of skin of other parts of face: Secondary | ICD-10-CM | POA: Diagnosis not present

## 2023-10-26 LAB — RAD ONC ARIA SESSION SUMMARY
Course Elapsed Days: 1
Plan Fractions Treated to Date: 2
Plan Prescribed Dose Per Fraction: 2 Gy
Plan Total Fractions Prescribed: 30
Plan Total Prescribed Dose: 60 Gy
Reference Point Dosage Given to Date: 4 Gy
Reference Point Session Dosage Given: 2 Gy
Session Number: 2

## 2023-10-26 NOTE — Progress Notes (Signed)
 Oncology Nurse Navigator Documentation   To provide support, encouragement and care continuity, met with Charles Solis before his initial R yesterday.  He was accompanied by his wife. He tolerated treatment well. He knows to call me if he has any questions or concerns as he proceeds through treatment.    Delon Jefferson RN, BSN, OCN Head & Neck Oncology Nurse Navigator [facility] Phone # [phone]  Fax # [phone]

## 2023-10-27 ENCOUNTER — Other Ambulatory Visit: Payer: Self-pay

## 2023-10-27 ENCOUNTER — Ambulatory Visit
Admission: RE | Admit: 2023-10-27 | Discharge: 2023-10-27 | Disposition: A | Discharge status: Home / Self Care | Source: Ambulatory Visit | Attending: Radiation Oncology | Admitting: Radiation Oncology

## 2023-10-27 ENCOUNTER — Other Ambulatory Visit: Payer: Self-pay | Admitting: Nurse Practitioner

## 2023-10-27 DIAGNOSIS — Z79899 Other long term (current) drug therapy: Secondary | ICD-10-CM | POA: Diagnosis not present

## 2023-10-27 DIAGNOSIS — C44209 Unspecified malignant neoplasm of skin of left ear and external auricular canal: Secondary | ICD-10-CM | POA: Diagnosis not present

## 2023-10-27 DIAGNOSIS — C770 Secondary and unspecified malignant neoplasm of lymph nodes of head, face and neck: Secondary | ICD-10-CM | POA: Diagnosis not present

## 2023-10-27 DIAGNOSIS — Z510 Encounter for antineoplastic radiation therapy: Secondary | ICD-10-CM | POA: Diagnosis not present

## 2023-10-27 DIAGNOSIS — C44329 Squamous cell carcinoma of skin of other parts of face: Secondary | ICD-10-CM | POA: Diagnosis not present

## 2023-10-27 LAB — RAD ONC ARIA SESSION SUMMARY
Course Elapsed Days: 2
Plan Fractions Treated to Date: 3
Plan Prescribed Dose Per Fraction: 2 Gy
Plan Total Fractions Prescribed: 30
Plan Total Prescribed Dose: 60 Gy
Reference Point Dosage Given to Date: 6 Gy
Reference Point Session Dosage Given: 2 Gy
Session Number: 3

## 2023-10-27 MED ORDER — ROSUVASTATIN CALCIUM 20 MG PO TABS: 20.0000 mg | ORAL_TABLET | Freq: Every day | ORAL | 3 refills | Status: AC

## 2023-10-30 ENCOUNTER — Ambulatory Visit
Admission: RE | Admit: 2023-10-30 | Discharge: 2023-10-30 | Disposition: A | Discharge status: Home / Self Care | Source: Ambulatory Visit | Attending: Radiation Oncology | Admitting: Radiation Oncology

## 2023-10-30 ENCOUNTER — Encounter (HOSPITAL_COMMUNITY): Payer: Self-pay | Admitting: *Deleted

## 2023-10-30 ENCOUNTER — Other Ambulatory Visit: Payer: Self-pay

## 2023-10-30 DIAGNOSIS — C44209 Unspecified malignant neoplasm of skin of left ear and external auricular canal: Secondary | ICD-10-CM | POA: Diagnosis not present

## 2023-10-30 DIAGNOSIS — Z510 Encounter for antineoplastic radiation therapy: Secondary | ICD-10-CM | POA: Diagnosis not present

## 2023-10-30 DIAGNOSIS — C770 Secondary and unspecified malignant neoplasm of lymph nodes of head, face and neck: Secondary | ICD-10-CM | POA: Diagnosis not present

## 2023-10-30 DIAGNOSIS — Z79899 Other long term (current) drug therapy: Secondary | ICD-10-CM | POA: Diagnosis not present

## 2023-10-30 DIAGNOSIS — C44329 Squamous cell carcinoma of skin of other parts of face: Secondary | ICD-10-CM | POA: Diagnosis not present

## 2023-10-30 LAB — RAD ONC ARIA SESSION SUMMARY
Course Elapsed Days: 5
Plan Fractions Treated to Date: 4
Plan Prescribed Dose Per Fraction: 2 Gy
Plan Total Fractions Prescribed: 30
Plan Total Prescribed Dose: 60 Gy
Reference Point Dosage Given to Date: 8 Gy
Reference Point Session Dosage Given: 2 Gy
Session Number: 4

## 2023-10-31 ENCOUNTER — Ambulatory Visit: Admitting: Radiation Oncology

## 2023-10-31 ENCOUNTER — Ambulatory Visit: Attending: Radiation Oncology

## 2023-10-31 ENCOUNTER — Other Ambulatory Visit: Payer: Self-pay

## 2023-10-31 ENCOUNTER — Ambulatory Visit
Admission: RE | Admit: 2023-10-31 | Discharge: 2023-10-31 | Disposition: A | Discharge status: Home / Self Care | Source: Ambulatory Visit | Attending: Radiation Oncology

## 2023-10-31 ENCOUNTER — Other Ambulatory Visit (HOSPITAL_BASED_OUTPATIENT_CLINIC_OR_DEPARTMENT_OTHER): Payer: Self-pay | Admitting: Cardiology

## 2023-10-31 DIAGNOSIS — Z510 Encounter for antineoplastic radiation therapy: Secondary | ICD-10-CM | POA: Diagnosis not present

## 2023-10-31 DIAGNOSIS — C44209 Unspecified malignant neoplasm of skin of left ear and external auricular canal: Secondary | ICD-10-CM | POA: Diagnosis not present

## 2023-10-31 DIAGNOSIS — C770 Secondary and unspecified malignant neoplasm of lymph nodes of head, face and neck: Secondary | ICD-10-CM | POA: Insufficient documentation

## 2023-10-31 DIAGNOSIS — Z79899 Other long term (current) drug therapy: Secondary | ICD-10-CM | POA: Diagnosis not present

## 2023-10-31 DIAGNOSIS — C44329 Squamous cell carcinoma of skin of other parts of face: Secondary | ICD-10-CM | POA: Diagnosis not present

## 2023-10-31 DIAGNOSIS — R471 Dysarthria and anarthria: Secondary | ICD-10-CM | POA: Diagnosis present

## 2023-10-31 DIAGNOSIS — R42 Dizziness and giddiness: Secondary | ICD-10-CM

## 2023-10-31 DIAGNOSIS — R0609 Other forms of dyspnea: Secondary | ICD-10-CM

## 2023-10-31 DIAGNOSIS — R1311 Dysphagia, oral phase: Secondary | ICD-10-CM | POA: Diagnosis present

## 2023-10-31 LAB — RAD ONC ARIA SESSION SUMMARY
Course Elapsed Days: 6
Plan Fractions Treated to Date: 5
Plan Prescribed Dose Per Fraction: 2 Gy
Plan Total Fractions Prescribed: 30
Plan Total Prescribed Dose: 60 Gy
Reference Point Dosage Given to Date: 10 Gy
Reference Point Session Dosage Given: 2 Gy
Session Number: 5

## 2023-10-31 NOTE — Patient Instructions (Addendum)
  Tongue exercises  - do each 10-15x twice each day Finger on right cheek and left cheek -press tongue to each for 2-3 seconds  Stretch tongue in pocket between cheek and gum as far as you can get it  Upper and lower Press your tongue straight forward on a spoon/depressor, then to right and left   Provided pt with a chart for practicing target phonemes With the word practice: Practice each sound with the words in the positions (initial, medial, final) Read off 10 words in each position, x2/day (choose /s/ or /z/)

## 2023-10-31 NOTE — Therapy (Signed)
 OUTPATIENT SPEECH LANGUAGE PATHOLOGY EVALUATION   Patient Name: Charles Solis MRN: [id number] DOB:[birth year], 73 y.o., male Today's Date: 10/31/2023  PCP: None noted in Epic REFERRING PROVIDER: Izell Domino, MD  END OF SESSION:  End of Session - 10/31/23 1605     Visit Number 1    Number of Visits 4    Date for SLP Re-Evaluation 01/29/24    SLP Start Time 1404    SLP Stop Time  1445    SLP Time Calculation (min) 41 min    Activity Tolerance Patient tolerated treatment well          Past Medical History:  Diagnosis Date   High cholesterol    Hypothyroidism    Prostate cancer [facility])    Past Surgical History:  Procedure Laterality Date   HERNIA REPAIR     inguinal   MOHS SURGERY     head,ear,cheek,nose   OTOPLASATY Left 09/13/2023   Procedure: OTOPLASTY;  Surgeon: Jesus Oliphant, MD;  Location: [facility] OR;  Service: ENT;  Laterality: Left;  Left wedge resection of pinna with primary closure.   PAROTIDECTOMY Left 09/13/2023   Procedure: EXCISION, PAROTID GLAND;  Surgeon: Jesus Oliphant, MD;  Location: [facility] OR;  Service: ENT;  Laterality: Left;   RADICAL NECK DISSECTION Left 09/13/2023   Procedure: DISSECTION, NECK, RADICAL;  Surgeon: Jesus Oliphant, MD;  Location: [facility] OR;  Service: ENT;  Laterality: Left;   TONSILLECTOMY     Patient Active Problem List   Diagnosis Date Noted   Cancer of skin of left ear 10/04/2023   Skin cancer 10/04/2023   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   Sensorineural hearing loss, bilateral 05/23/2023   HTN (hypertension) 11/16/2018   Hyperlipidemia 09/24/2014    ONSET DATE: see pertinent history below  REFERRING DIAG:  C77.0 (ICD-10-CM) - Secondary malignant neoplasm of cervical lymph node (HCC)    THERAPY DIAG:  Dysarthria and anarthria  Oral phase dysphagia  Rationale for Evaluation and Treatment: Rehabilitation  SUBJECTIVE:   SUBJECTIVE STATEMENT: I have had slurred speech since the surgery and it  has improved but isn't where I'd like it.  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 A few months ago he developed a lesion located on his left jaw area. Lesion had continued to increase in size. To investigate his symptoms he underwent a CT neck on 07/18/23 showing an ovoid centrally hypodense lesion present posteriorly within or abutting the superficial lobe of the left parotid, measuring approximately 22 x 17 x 18 mm.  08/31/23 He underwent a FNA. Pathology indicated the presence of malignant cells, carcinoma with squamous differentiation with no lymphoid tissue identified. 09/04/23 He presented to Dr. Jesus for second opinion. Based on his recommendations, patient underwent a parotidectomy with radical neck dissection on 09/13/23. Surgical pathology redemonstrated tumor size of 3.5 x 3.2 x 2.3 cm and histology of invasive moderately differentiated squamous cell carcinoma with tumor present in dermis and infiltrates the subcutaneous adipose to superficially invade the underlying parotid gland. Tumor present in 3:00 margin but is negative for angiolymphatic and perineural invasion. Neck dissection pathology of 20 lymph nodes are negative for carcinoma.09/28/23 PET showing no evidence of metastatic disease. 10/03/23 Consult with Dr. Izell. 10/06/23 Consult with Dr. Autumn. He will receive radiation then after it's completed immunotherapy with Libtayo every 3 weeks for one year.  Treatment plan:  He will receive 33 fractions of radiation  to his left parotid which will start on 10/25/23 and will complete 12/11/23.Pretreatment procedures:09/13/23 Left Parotidectomy  PAIN:  Are you having pain? No  FALLS: Has patient fallen in last 6 months?  See PT evaluation for details  LIVING ENVIRONMENT: Lives with: lives with their spouse Lives in: House/apartment  PLOF:  Level of assistance: Independent with ADLs, Independent with  IADLs Employment: Part-time employment  PATIENT GOALS: Improve speech clarity  OBJECTIVE:  Note: Objective measures were completed at Evaluation unless otherwise noted.   COGNITION: Overall cognitive status: Within functional limits for tasks assessed  MOTOR SPEECH: Overall motor speech: impaired Level of impairment: Word, Phrase, Sentence, and Conversation Respiration: thoracic breathing and diaphragmatic/abdominal breathing Phonation: normal Resonance: WFL Articulation: Impaired: word, phrase, sentence, and conversation Intelligibility: Intelligible Motor planning: Appears intact Effective technique: slow rate and over articulate  ORAL MOTOR EXAMINATION: Overall status: Impaired: Labial: Left (ROM, Symmetry, Strength, and Coordination) Lingual: Left (ROM, Symmetry, Strength, and Coordination) Facial: Left (Strength, Sensation, and Coordination) Comments: Upper lt labial structure has better ROM than lower, minimal lt lingual ROM    Pt does not report difficulty with swallowing which does not warrant further evaluation. Pt had difficulty with bolus manipulation but this has largely resolved.  PATIENT REPORTED OUTCOME MEASURES (PROM): Speech (long form): to be provided in first session                                                                                                                            TREATMENT DATE:   10/31/23: Today after evaluation SLP instructed pt about and taught pt a home practice program including exercises, and compensation of overarticulation.when talking to improve articulatory precision.   PATIENT EDUCATION: Education details: see treatment date Person educated: Patient Education method: Explanation, Demonstration, Verbal cues, and Handouts Education comprehension: verbalized understanding, returned demonstration, verbal cues required, and needs further education  HOME EXERCISE PROGRAM: Provided today - see pt  instructions   GOALS: Goals reviewed with patient? Yes  SHORT TERM GOALS: Target date: 12/04/23  Pt will improve articulation to limit misarticulations to 7 in 5 minutes conversation  Baseline: Goal status: INITIAL  2.  Pt will complete HEP with min A Baseline:  Goal status: INITIAL  3.  Pt will be able to tell SLP rationale for using speech compensations Baseline:  Goal status: INITIAL    LONG TERM GOALS: Target date: 01/04/24  Pt will improve PROM compared with initial administration Baseline:  Goal status: INITIAL  2.  Pt will complete HEP with modified independence Baseline:  Goal status: INITIAL  3.  Pt will improve articulation to limit misarticulations to 3 in 5 minutes conversation Baseline:  Goal status: INITIAL   ASSESSMENT:  CLINICAL IMPRESSION: Patient is a 73 y.o. 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 After sx pt experienced slurred speech and difficulty with bolus manipulation. Signe is having specific difficulty with /s/, /z/, ks(x),  sh, and er in final position. He has home program to work on these phonemes in words in differing positions. He is aware this skill may return spontaneously but would like something in the interim to work on at home. At this time bolus manipulation has returned functionally and pt can chew again on lt side; Recently he also can remove residual from rt labial sulcus. Oral dysphagia, he stated, is not a primary concern at this time.  OBJECTIVE IMPAIRMENTS: Objective impairments include dysarthria and dysphagia. These impairments are limiting patient from ADLs/IADLs, effectively communicating at home and in community, and safety when swallowing.Factors affecting potential to achieve goals and functional outcome are surgical changes.. Patient will benefit from skilled SLP services to address above impairments and improve overall function.  REHAB POTENTIAL:  Good  PLAN:  SLP FREQUENCY: once approx every 4 weeks   SLP DURATION: 3 sessions  PLANNED INTERVENTIONS: Oral motor exercises, SLP instruction and feedback, Compensatory strategies, Patient/family education, and [id number] Treatment of speech (30 or 45 min)     Deatrice Spanbauer, CCC-SLP 10/31/2023, 4:13 PM

## 2023-11-01 ENCOUNTER — Other Ambulatory Visit: Payer: Self-pay

## 2023-11-01 ENCOUNTER — Ambulatory Visit
Admission: RE | Admit: 2023-11-01 | Discharge: 2023-11-01 | Disposition: A | Discharge status: Home / Self Care | Source: Ambulatory Visit | Attending: Radiation Oncology | Admitting: Radiation Oncology

## 2023-11-01 DIAGNOSIS — C44209 Unspecified malignant neoplasm of skin of left ear and external auricular canal: Secondary | ICD-10-CM | POA: Diagnosis not present

## 2023-11-01 DIAGNOSIS — Z79899 Other long term (current) drug therapy: Secondary | ICD-10-CM | POA: Diagnosis not present

## 2023-11-01 DIAGNOSIS — C44329 Squamous cell carcinoma of skin of other parts of face: Secondary | ICD-10-CM | POA: Diagnosis not present

## 2023-11-01 DIAGNOSIS — C770 Secondary and unspecified malignant neoplasm of lymph nodes of head, face and neck: Secondary | ICD-10-CM | POA: Diagnosis not present

## 2023-11-01 DIAGNOSIS — Z510 Encounter for antineoplastic radiation therapy: Secondary | ICD-10-CM | POA: Diagnosis not present

## 2023-11-01 LAB — RAD ONC ARIA SESSION SUMMARY
Course Elapsed Days: 7
Plan Fractions Treated to Date: 6
Plan Prescribed Dose Per Fraction: 2 Gy
Plan Total Fractions Prescribed: 30
Plan Total Prescribed Dose: 60 Gy
Reference Point Dosage Given to Date: 12 Gy
Reference Point Session Dosage Given: 2 Gy
Session Number: 6

## 2023-11-02 ENCOUNTER — Ambulatory Visit
Admission: RE | Admit: 2023-11-02 | Discharge: 2023-11-02 | Disposition: A | Discharge status: Home / Self Care | Source: Ambulatory Visit | Attending: Radiation Oncology | Admitting: Radiation Oncology

## 2023-11-02 ENCOUNTER — Inpatient Hospital Stay: Attending: Oncology | Admitting: Nutrition

## 2023-11-02 ENCOUNTER — Other Ambulatory Visit: Payer: Self-pay

## 2023-11-02 ENCOUNTER — Ambulatory Visit

## 2023-11-02 DIAGNOSIS — Z510 Encounter for antineoplastic radiation therapy: Secondary | ICD-10-CM | POA: Diagnosis not present

## 2023-11-02 DIAGNOSIS — C44329 Squamous cell carcinoma of skin of other parts of face: Secondary | ICD-10-CM | POA: Diagnosis not present

## 2023-11-02 DIAGNOSIS — Z79899 Other long term (current) drug therapy: Secondary | ICD-10-CM | POA: Diagnosis not present

## 2023-11-02 DIAGNOSIS — C44209 Unspecified malignant neoplasm of skin of left ear and external auricular canal: Secondary | ICD-10-CM | POA: Diagnosis not present

## 2023-11-02 DIAGNOSIS — C770 Secondary and unspecified malignant neoplasm of lymph nodes of head, face and neck: Secondary | ICD-10-CM | POA: Diagnosis not present

## 2023-11-02 LAB — RAD ONC ARIA SESSION SUMMARY
Course Elapsed Days: 8
Plan Fractions Treated to Date: 7
Plan Prescribed Dose Per Fraction: 2 Gy
Plan Total Fractions Prescribed: 30
Plan Total Prescribed Dose: 60 Gy
Reference Point Dosage Given to Date: 14 Gy
Reference Point Session Dosage Given: 2 Gy
Session Number: 7

## 2023-11-02 NOTE — Progress Notes (Signed)
 73 yo male diagnosed with cancer of the skin/face S/p parotidectomy and radical neck dissection on July 30. Plan includes adjuvant radiation with Cemiplimab every 3 weeks for one year once radiation is completed. He is followed by Dr. Izell and Dr. Autumn.  PMH includes prostate cancer, multiple skin cancers, hypercholesterolemia and Hypothyroidism.  Medications include Vitamin B 12 and Saw Palmetto .  Labs include BUN 25.  Height: 6'1. Weight: 188 pounds UBW: 185-190 pounds BMI: 24.8  Telephone call completed. Patient denies nutrition impact symptoms. He is within his usual body weight range. He is eating normally. He has a supportive girlfriend who cooks for him and reminds him to stay hydrated. Has no current needs.  Nutrition Diagnosis: Food and Nutrition Related Knowledge Deficit related to cancer and associated treatments as evidenced by no prior need for nutrition related information  Intervention: Consume small, frequent meals and snacks with high calorie, high protein foods to maintain weight throughout treatment. Modify textures as needed and add ONS as needed. Use baking soda and salt water gargle before eating and in the first thing in the morning and before bed. Consume adequate fluids. Provided contact information.  Monitoring, Evaluation, Goals: Tolerate adequate calories and protein to maintain weight.  Next Visit: Thursday, September 25 with Elvie after radiation treatment. Patient is aware.

## 2023-11-03 ENCOUNTER — Ambulatory Visit

## 2023-11-03 ENCOUNTER — Ambulatory Visit
Admission: RE | Admit: 2023-11-03 | Discharge: 2023-11-03 | Disposition: A | Discharge status: Home / Self Care | Source: Ambulatory Visit | Attending: Radiation Oncology | Admitting: Radiation Oncology

## 2023-11-03 ENCOUNTER — Other Ambulatory Visit: Payer: Self-pay

## 2023-11-03 DIAGNOSIS — C44329 Squamous cell carcinoma of skin of other parts of face: Secondary | ICD-10-CM | POA: Diagnosis not present

## 2023-11-03 DIAGNOSIS — C44209 Unspecified malignant neoplasm of skin of left ear and external auricular canal: Secondary | ICD-10-CM | POA: Diagnosis not present

## 2023-11-03 DIAGNOSIS — Z510 Encounter for antineoplastic radiation therapy: Secondary | ICD-10-CM | POA: Diagnosis not present

## 2023-11-03 DIAGNOSIS — C770 Secondary and unspecified malignant neoplasm of lymph nodes of head, face and neck: Secondary | ICD-10-CM | POA: Diagnosis not present

## 2023-11-03 DIAGNOSIS — Z79899 Other long term (current) drug therapy: Secondary | ICD-10-CM | POA: Diagnosis not present

## 2023-11-03 LAB — RAD ONC ARIA SESSION SUMMARY
Course Elapsed Days: 9
Plan Fractions Treated to Date: 8
Plan Prescribed Dose Per Fraction: 2 Gy
Plan Total Fractions Prescribed: 30
Plan Total Prescribed Dose: 60 Gy
Reference Point Dosage Given to Date: 16 Gy
Reference Point Session Dosage Given: 2 Gy
Session Number: 8

## 2023-11-06 ENCOUNTER — Ambulatory Visit
Admission: RE | Admit: 2023-11-06 | Discharge: 2023-11-06 | Disposition: A | Discharge status: Home / Self Care | Source: Ambulatory Visit | Attending: Radiation Oncology

## 2023-11-06 ENCOUNTER — Telehealth: Payer: Self-pay | Admitting: Cardiology

## 2023-11-06 ENCOUNTER — Other Ambulatory Visit: Payer: Self-pay

## 2023-11-06 DIAGNOSIS — Z79899 Other long term (current) drug therapy: Secondary | ICD-10-CM | POA: Diagnosis not present

## 2023-11-06 DIAGNOSIS — C44329 Squamous cell carcinoma of skin of other parts of face: Secondary | ICD-10-CM | POA: Diagnosis not present

## 2023-11-06 DIAGNOSIS — Z510 Encounter for antineoplastic radiation therapy: Secondary | ICD-10-CM | POA: Diagnosis not present

## 2023-11-06 DIAGNOSIS — C770 Secondary and unspecified malignant neoplasm of lymph nodes of head, face and neck: Secondary | ICD-10-CM | POA: Diagnosis not present

## 2023-11-06 DIAGNOSIS — C44209 Unspecified malignant neoplasm of skin of left ear and external auricular canal: Secondary | ICD-10-CM | POA: Diagnosis not present

## 2023-11-06 LAB — RAD ONC ARIA SESSION SUMMARY
Course Elapsed Days: 12
Plan Fractions Treated to Date: 9
Plan Prescribed Dose Per Fraction: 2 Gy
Plan Total Fractions Prescribed: 30
Plan Total Prescribed Dose: 60 Gy
Reference Point Dosage Given to Date: 18 Gy
Reference Point Session Dosage Given: 2 Gy
Session Number: 9

## 2023-11-06 NOTE — Telephone Encounter (Signed)
 Routed to MD to review

## 2023-11-06 NOTE — Telephone Encounter (Signed)
 Pt is having radiation and wanted to know if it was ok to do a stress test Please advise

## 2023-11-07 ENCOUNTER — Ambulatory Visit
Admission: RE | Admit: 2023-11-07 | Discharge: 2023-11-07 | Disposition: A | Discharge status: Home / Self Care | Source: Ambulatory Visit | Attending: Radiation Oncology | Admitting: Radiation Oncology

## 2023-11-07 ENCOUNTER — Other Ambulatory Visit: Payer: Self-pay

## 2023-11-07 DIAGNOSIS — Z79899 Other long term (current) drug therapy: Secondary | ICD-10-CM | POA: Diagnosis not present

## 2023-11-07 DIAGNOSIS — C44329 Squamous cell carcinoma of skin of other parts of face: Secondary | ICD-10-CM | POA: Diagnosis not present

## 2023-11-07 DIAGNOSIS — C770 Secondary and unspecified malignant neoplasm of lymph nodes of head, face and neck: Secondary | ICD-10-CM | POA: Diagnosis not present

## 2023-11-07 DIAGNOSIS — C44209 Unspecified malignant neoplasm of skin of left ear and external auricular canal: Secondary | ICD-10-CM | POA: Diagnosis not present

## 2023-11-07 DIAGNOSIS — Z510 Encounter for antineoplastic radiation therapy: Secondary | ICD-10-CM | POA: Diagnosis not present

## 2023-11-07 LAB — RAD ONC ARIA SESSION SUMMARY
Course Elapsed Days: 13
Plan Fractions Treated to Date: 10
Plan Prescribed Dose Per Fraction: 2 Gy
Plan Total Fractions Prescribed: 30
Plan Total Prescribed Dose: 60 Gy
Reference Point Dosage Given to Date: 20 Gy
Reference Point Session Dosage Given: 2 Gy
Session Number: 10

## 2023-11-07 NOTE — Telephone Encounter (Signed)
 Yes okay to proceed with stress test

## 2023-11-08 ENCOUNTER — Ambulatory Visit
Admission: RE | Admit: 2023-11-08 | Discharge: 2023-11-08 | Disposition: A | Discharge status: Home / Self Care | Source: Ambulatory Visit | Attending: Radiation Oncology | Admitting: Radiation Oncology

## 2023-11-08 ENCOUNTER — Ambulatory Visit (HOSPITAL_BASED_OUTPATIENT_CLINIC_OR_DEPARTMENT_OTHER)
Admission: RE | Admit: 2023-11-08 | Discharge: 2023-11-08 | Disposition: A | Discharge status: Home / Self Care | Source: Ambulatory Visit | Attending: Cardiology | Admitting: Cardiology

## 2023-11-08 ENCOUNTER — Ambulatory Visit (HOSPITAL_COMMUNITY)
Admission: RE | Admit: 2023-11-08 | Discharge: 2023-11-08 | Disposition: A | Discharge status: Home / Self Care | Source: Ambulatory Visit | Attending: Cardiovascular Disease | Admitting: Cardiovascular Disease

## 2023-11-08 ENCOUNTER — Other Ambulatory Visit: Payer: Self-pay

## 2023-11-08 DIAGNOSIS — Z79899 Other long term (current) drug therapy: Secondary | ICD-10-CM | POA: Diagnosis not present

## 2023-11-08 DIAGNOSIS — Z510 Encounter for antineoplastic radiation therapy: Secondary | ICD-10-CM | POA: Diagnosis not present

## 2023-11-08 DIAGNOSIS — C44329 Squamous cell carcinoma of skin of other parts of face: Secondary | ICD-10-CM | POA: Diagnosis not present

## 2023-11-08 DIAGNOSIS — C44209 Unspecified malignant neoplasm of skin of left ear and external auricular canal: Secondary | ICD-10-CM | POA: Diagnosis not present

## 2023-11-08 DIAGNOSIS — C770 Secondary and unspecified malignant neoplasm of lymph nodes of head, face and neck: Secondary | ICD-10-CM | POA: Diagnosis not present

## 2023-11-08 DIAGNOSIS — R42 Dizziness and giddiness: Secondary | ICD-10-CM

## 2023-11-08 DIAGNOSIS — R0609 Other forms of dyspnea: Secondary | ICD-10-CM | POA: Insufficient documentation

## 2023-11-08 DIAGNOSIS — I2510 Atherosclerotic heart disease of native coronary artery without angina pectoris: Secondary | ICD-10-CM | POA: Diagnosis not present

## 2023-11-08 LAB — RAD ONC ARIA SESSION SUMMARY
Course Elapsed Days: 14
Plan Fractions Treated to Date: 11
Plan Prescribed Dose Per Fraction: 2 Gy
Plan Total Fractions Prescribed: 30
Plan Total Prescribed Dose: 60 Gy
Reference Point Dosage Given to Date: 22 Gy
Reference Point Session Dosage Given: 2 Gy
Session Number: 11

## 2023-11-08 MED ORDER — TECHNETIUM TC 99M TETROFOSMIN IV KIT
10.5000 | PACK | Freq: Once | INTRAVENOUS | Status: AC | PRN
Start: 2023-11-08 — End: 2023-11-08
  Administered 2023-11-08: 10.5 via INTRAVENOUS

## 2023-11-08 MED ORDER — TECHNETIUM TC 99M TETROFOSMIN IV KIT
32.4000 | PACK | Freq: Once | INTRAVENOUS | Status: AC | PRN
  Administered 2023-11-08: 32.4 via INTRAVENOUS

## 2023-11-09 ENCOUNTER — Other Ambulatory Visit: Payer: Self-pay

## 2023-11-09 ENCOUNTER — Ambulatory Visit: Payer: Self-pay | Admitting: Cardiology

## 2023-11-09 ENCOUNTER — Inpatient Hospital Stay: Admitting: Dietician

## 2023-11-09 ENCOUNTER — Ambulatory Visit
Admission: RE | Admit: 2023-11-09 | Discharge: 2023-11-09 | Disposition: A | Discharge status: Home / Self Care | Source: Ambulatory Visit | Attending: Radiation Oncology

## 2023-11-09 DIAGNOSIS — I2510 Atherosclerotic heart disease of native coronary artery without angina pectoris: Secondary | ICD-10-CM

## 2023-11-09 DIAGNOSIS — Z79899 Other long term (current) drug therapy: Secondary | ICD-10-CM | POA: Diagnosis not present

## 2023-11-09 DIAGNOSIS — C44209 Unspecified malignant neoplasm of skin of left ear and external auricular canal: Secondary | ICD-10-CM | POA: Diagnosis not present

## 2023-11-09 DIAGNOSIS — Z510 Encounter for antineoplastic radiation therapy: Secondary | ICD-10-CM | POA: Diagnosis not present

## 2023-11-09 DIAGNOSIS — C770 Secondary and unspecified malignant neoplasm of lymph nodes of head, face and neck: Secondary | ICD-10-CM | POA: Diagnosis not present

## 2023-11-09 DIAGNOSIS — C44329 Squamous cell carcinoma of skin of other parts of face: Secondary | ICD-10-CM | POA: Diagnosis not present

## 2023-11-09 LAB — MYOCARDIAL PERFUSION IMAGING
Angina Index: 0
Duke Treadmill Score: 6
Estimated workload: 7.3
Exercise duration (min): 6 min
Exercise duration (sec): 15 s
LV dias vol: 83 mL (ref 62–150)
LV sys vol: 21 mL (ref 4.2–5.8)
MPHR: 147 {beats}/min
Nuc Stress EF: 75 %
Peak HR: 171 {beats}/min
Percent HR: 116 %
Rest HR: 94 {beats}/min
Rest Nuclear Isotope Dose: 10.5 mCi
SDS: 0
SRS: 0
SSS: 0
ST Depression (mm): 0 mm
Stress Nuclear Isotope Dose: 32.4 mCi
TID: 0.92

## 2023-11-09 LAB — RAD ONC ARIA SESSION SUMMARY
Course Elapsed Days: 15
Plan Fractions Treated to Date: 12
Plan Prescribed Dose Per Fraction: 2 Gy
Plan Total Fractions Prescribed: 30
Plan Total Prescribed Dose: 60 Gy
Reference Point Dosage Given to Date: 24 Gy
Reference Point Session Dosage Given: 2 Gy
Session Number: 12

## 2023-11-09 NOTE — Progress Notes (Signed)
 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 Patient currently receiving radiation to left parotid.   Met with patient in office. He reports increasing fatigue. Patient noticing thickness to saliva and diminished taste. Patient drinking lots of water. He is doing baking soda salt water rinses 4-5 times/day. Appetite is decreased. He is trying to eat small amounts more often and supplementing with Orgain Shakes BID. Tolerating soft textures best (oatmeal, soups, pudding cups, yogurt, red beans, eggs). Observed redness to right neck. He has not started using lotion yet. Patient will have significant other put this on once home.    Medications: reviewed   Labs: no new labs   Anthropometrics: Last wt 186.2 lb redia) on 9/22    NUTRITION DIAGNOSIS: Food and nutrition related knowledge deficit improving    INTERVENTION:  Continue strategies for increasing calories and protein with small frequent meals Continue 2 Orgain Shakes - agreeable to third at bedtime if po of     MONITORING, EVALUATION, GOAL: wt trends, intake    NEXT VISIT: Wednesday October 1 after radiation

## 2023-11-10 ENCOUNTER — Ambulatory Visit
Admission: RE | Admit: 2023-11-10 | Discharge: 2023-11-10 | Disposition: A | Discharge status: Home / Self Care | Source: Ambulatory Visit | Attending: Radiation Oncology | Admitting: Radiation Oncology

## 2023-11-10 ENCOUNTER — Other Ambulatory Visit: Payer: Self-pay

## 2023-11-10 DIAGNOSIS — C770 Secondary and unspecified malignant neoplasm of lymph nodes of head, face and neck: Secondary | ICD-10-CM | POA: Diagnosis not present

## 2023-11-10 DIAGNOSIS — Z510 Encounter for antineoplastic radiation therapy: Secondary | ICD-10-CM | POA: Diagnosis not present

## 2023-11-10 DIAGNOSIS — C44329 Squamous cell carcinoma of skin of other parts of face: Secondary | ICD-10-CM | POA: Diagnosis not present

## 2023-11-10 DIAGNOSIS — Z79899 Other long term (current) drug therapy: Secondary | ICD-10-CM | POA: Diagnosis not present

## 2023-11-10 DIAGNOSIS — C44209 Unspecified malignant neoplasm of skin of left ear and external auricular canal: Secondary | ICD-10-CM | POA: Diagnosis not present

## 2023-11-10 LAB — RAD ONC ARIA SESSION SUMMARY
Course Elapsed Days: 16
Plan Fractions Treated to Date: 13
Plan Prescribed Dose Per Fraction: 2 Gy
Plan Total Fractions Prescribed: 30
Plan Total Prescribed Dose: 60 Gy
Reference Point Dosage Given to Date: 26 Gy
Reference Point Session Dosage Given: 2 Gy
Session Number: 13

## 2023-11-13 ENCOUNTER — Other Ambulatory Visit: Payer: Self-pay

## 2023-11-13 ENCOUNTER — Ambulatory Visit
Admission: RE | Admit: 2023-11-13 | Discharge: 2023-11-13 | Disposition: A | Discharge status: Home / Self Care | Source: Ambulatory Visit | Attending: Radiation Oncology | Admitting: Radiation Oncology

## 2023-11-13 DIAGNOSIS — C770 Secondary and unspecified malignant neoplasm of lymph nodes of head, face and neck: Secondary | ICD-10-CM | POA: Diagnosis not present

## 2023-11-13 DIAGNOSIS — Z510 Encounter for antineoplastic radiation therapy: Secondary | ICD-10-CM | POA: Diagnosis not present

## 2023-11-13 DIAGNOSIS — C44329 Squamous cell carcinoma of skin of other parts of face: Secondary | ICD-10-CM | POA: Diagnosis not present

## 2023-11-13 DIAGNOSIS — C44209 Unspecified malignant neoplasm of skin of left ear and external auricular canal: Secondary | ICD-10-CM | POA: Diagnosis not present

## 2023-11-13 DIAGNOSIS — Z79899 Other long term (current) drug therapy: Secondary | ICD-10-CM | POA: Diagnosis not present

## 2023-11-13 LAB — RAD ONC ARIA SESSION SUMMARY
Course Elapsed Days: 19
Plan Fractions Treated to Date: 14
Plan Prescribed Dose Per Fraction: 2 Gy
Plan Total Fractions Prescribed: 30
Plan Total Prescribed Dose: 60 Gy
Reference Point Dosage Given to Date: 28 Gy
Reference Point Session Dosage Given: 2 Gy
Session Number: 14

## 2023-11-14 ENCOUNTER — Other Ambulatory Visit: Payer: Self-pay

## 2023-11-14 ENCOUNTER — Ambulatory Visit
Admission: RE | Admit: 2023-11-14 | Discharge: 2023-11-14 | Disposition: A | Discharge status: Home / Self Care | Source: Ambulatory Visit | Attending: Radiation Oncology | Admitting: Radiation Oncology

## 2023-11-14 DIAGNOSIS — C770 Secondary and unspecified malignant neoplasm of lymph nodes of head, face and neck: Secondary | ICD-10-CM | POA: Diagnosis not present

## 2023-11-14 DIAGNOSIS — C44209 Unspecified malignant neoplasm of skin of left ear and external auricular canal: Secondary | ICD-10-CM | POA: Diagnosis not present

## 2023-11-14 DIAGNOSIS — C44329 Squamous cell carcinoma of skin of other parts of face: Secondary | ICD-10-CM | POA: Diagnosis not present

## 2023-11-14 DIAGNOSIS — Z510 Encounter for antineoplastic radiation therapy: Secondary | ICD-10-CM | POA: Diagnosis not present

## 2023-11-14 DIAGNOSIS — Z79899 Other long term (current) drug therapy: Secondary | ICD-10-CM | POA: Diagnosis not present

## 2023-11-14 LAB — RAD ONC ARIA SESSION SUMMARY
Course Elapsed Days: 20
Plan Fractions Treated to Date: 15
Plan Prescribed Dose Per Fraction: 2 Gy
Plan Total Fractions Prescribed: 30
Plan Total Prescribed Dose: 60 Gy
Reference Point Dosage Given to Date: 30 Gy
Reference Point Session Dosage Given: 2 Gy
Session Number: 15

## 2023-11-15 ENCOUNTER — Ambulatory Visit
Admission: RE | Admit: 2023-11-15 | Discharge: 2023-11-15 | Disposition: A | Discharge status: Home / Self Care | Source: Ambulatory Visit | Attending: Radiation Oncology | Admitting: Radiation Oncology

## 2023-11-15 ENCOUNTER — Other Ambulatory Visit: Payer: Self-pay

## 2023-11-15 ENCOUNTER — Inpatient Hospital Stay: Attending: Oncology | Admitting: Dietician

## 2023-11-15 ENCOUNTER — Ambulatory Visit: Payer: Self-pay | Admitting: Cardiology

## 2023-11-15 DIAGNOSIS — Z923 Personal history of irradiation: Secondary | ICD-10-CM | POA: Insufficient documentation

## 2023-11-15 DIAGNOSIS — C770 Secondary and unspecified malignant neoplasm of lymph nodes of head, face and neck: Secondary | ICD-10-CM | POA: Insufficient documentation

## 2023-11-15 DIAGNOSIS — Z7982 Long term (current) use of aspirin: Secondary | ICD-10-CM | POA: Insufficient documentation

## 2023-11-15 DIAGNOSIS — C44209 Unspecified malignant neoplasm of skin of left ear and external auricular canal: Secondary | ICD-10-CM | POA: Diagnosis not present

## 2023-11-15 DIAGNOSIS — Z8546 Personal history of malignant neoplasm of prostate: Secondary | ICD-10-CM | POA: Insufficient documentation

## 2023-11-15 DIAGNOSIS — Z510 Encounter for antineoplastic radiation therapy: Secondary | ICD-10-CM | POA: Diagnosis not present

## 2023-11-15 DIAGNOSIS — C4449 Other specified malignant neoplasm of skin of scalp and neck: Secondary | ICD-10-CM | POA: Insufficient documentation

## 2023-11-15 DIAGNOSIS — Z85828 Personal history of other malignant neoplasm of skin: Secondary | ICD-10-CM | POA: Insufficient documentation

## 2023-11-15 DIAGNOSIS — Z79899 Other long term (current) drug therapy: Secondary | ICD-10-CM | POA: Insufficient documentation

## 2023-11-15 LAB — RAD ONC ARIA SESSION SUMMARY
Course Elapsed Days: 21
Plan Fractions Treated to Date: 16
Plan Prescribed Dose Per Fraction: 2 Gy
Plan Total Fractions Prescribed: 30
Plan Total Prescribed Dose: 60 Gy
Reference Point Dosage Given to Date: 32 Gy
Reference Point Session Dosage Given: 2 Gy
Session Number: 16

## 2023-11-15 NOTE — Progress Notes (Signed)
 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 Patient currently receiving radiation to left parotid.   Met with patient and significant other in office following radiation. Taste is diminished. Mouth is dry. Saliva is thick. Patient drinking lots of water. He is doing baking soda salt water rinses several times daily. Patient drinking 2 Orgain Shakes. Reports eating a bowl of ice cream in the evenings.    Medications: reviewed   Labs: no new labs   Anthropometrics: Last wt 184.6 lb on 8/29  9/22 - 186.2 lb    NUTRITION DIAGNOSIS: Food and nutrition related knowledge deficit improving    INTERVENTION:  Continue small frequent meals/snacks, encourage high calorie high protein foods  Continue Orgain Shakes, recommend increasing TID if meal intake is poor Continue bedtime snack  Discussed strategies for dry mouth/thick saliva - suggested trying ginger ale/sprite rinses to aid with thick saliva (handout with tips provided)    MONITORING, EVALUATION, GOAL: wt trends, intake   NEXT VISIT: Thursday October 9 after radiation with Heron (pt aware)

## 2023-11-16 ENCOUNTER — Other Ambulatory Visit: Payer: Self-pay

## 2023-11-16 ENCOUNTER — Ambulatory Visit
Admission: RE | Admit: 2023-11-16 | Discharge: 2023-11-16 | Disposition: A | Discharge status: Home / Self Care | Source: Ambulatory Visit | Attending: Radiation Oncology | Admitting: Radiation Oncology

## 2023-11-16 DIAGNOSIS — C44209 Unspecified malignant neoplasm of skin of left ear and external auricular canal: Secondary | ICD-10-CM | POA: Diagnosis not present

## 2023-11-16 DIAGNOSIS — C770 Secondary and unspecified malignant neoplasm of lymph nodes of head, face and neck: Secondary | ICD-10-CM | POA: Diagnosis not present

## 2023-11-16 DIAGNOSIS — Z510 Encounter for antineoplastic radiation therapy: Secondary | ICD-10-CM | POA: Diagnosis not present

## 2023-11-16 LAB — RAD ONC ARIA SESSION SUMMARY
Course Elapsed Days: 22
Plan Fractions Treated to Date: 17
Plan Prescribed Dose Per Fraction: 2 Gy
Plan Total Fractions Prescribed: 30
Plan Total Prescribed Dose: 60 Gy
Reference Point Dosage Given to Date: 34 Gy
Reference Point Session Dosage Given: 2 Gy
Session Number: 17

## 2023-11-17 ENCOUNTER — Ambulatory Visit
Admission: RE | Admit: 2023-11-17 | Discharge: 2023-11-17 | Disposition: A | Discharge status: Home / Self Care | Source: Ambulatory Visit | Attending: Radiation Oncology | Admitting: Radiation Oncology

## 2023-11-17 ENCOUNTER — Other Ambulatory Visit: Payer: Self-pay

## 2023-11-17 DIAGNOSIS — C44209 Unspecified malignant neoplasm of skin of left ear and external auricular canal: Secondary | ICD-10-CM | POA: Diagnosis not present

## 2023-11-17 DIAGNOSIS — C770 Secondary and unspecified malignant neoplasm of lymph nodes of head, face and neck: Secondary | ICD-10-CM | POA: Diagnosis not present

## 2023-11-17 DIAGNOSIS — Z510 Encounter for antineoplastic radiation therapy: Secondary | ICD-10-CM | POA: Diagnosis not present

## 2023-11-17 LAB — RAD ONC ARIA SESSION SUMMARY
Course Elapsed Days: 23
Plan Fractions Treated to Date: 18
Plan Prescribed Dose Per Fraction: 2 Gy
Plan Total Fractions Prescribed: 30
Plan Total Prescribed Dose: 60 Gy
Reference Point Dosage Given to Date: 36 Gy
Reference Point Session Dosage Given: 2 Gy
Session Number: 18

## 2023-11-19 ENCOUNTER — Ambulatory Visit

## 2023-11-20 ENCOUNTER — Ambulatory Visit
Admission: RE | Admit: 2023-11-20 | Discharge: 2023-11-20 | Disposition: A | Discharge status: Home / Self Care | Payer: Self-pay | Source: Ambulatory Visit | Attending: Radiation Oncology | Admitting: Radiation Oncology

## 2023-11-20 ENCOUNTER — Ambulatory Visit
Admission: RE | Admit: 2023-11-20 | Discharge: 2023-11-20 | Disposition: A | Source: Ambulatory Visit | Attending: Radiation Oncology

## 2023-11-20 ENCOUNTER — Other Ambulatory Visit: Payer: Self-pay

## 2023-11-20 DIAGNOSIS — C44209 Unspecified malignant neoplasm of skin of left ear and external auricular canal: Secondary | ICD-10-CM | POA: Diagnosis not present

## 2023-11-20 DIAGNOSIS — C770 Secondary and unspecified malignant neoplasm of lymph nodes of head, face and neck: Secondary | ICD-10-CM | POA: Diagnosis not present

## 2023-11-20 DIAGNOSIS — Z510 Encounter for antineoplastic radiation therapy: Secondary | ICD-10-CM | POA: Diagnosis not present

## 2023-11-20 LAB — RAD ONC ARIA SESSION SUMMARY
Course Elapsed Days: 26
Plan Fractions Treated to Date: 19
Plan Prescribed Dose Per Fraction: 2 Gy
Plan Total Fractions Prescribed: 30
Plan Total Prescribed Dose: 60 Gy
Reference Point Dosage Given to Date: 38 Gy
Reference Point Session Dosage Given: 2 Gy
Session Number: 19

## 2023-11-21 ENCOUNTER — Other Ambulatory Visit: Payer: Self-pay

## 2023-11-21 ENCOUNTER — Ambulatory Visit
Admission: RE | Admit: 2023-11-21 | Discharge: 2023-11-21 | Disposition: A | Discharge status: Home / Self Care | Source: Ambulatory Visit | Attending: Radiation Oncology | Admitting: Radiation Oncology

## 2023-11-21 DIAGNOSIS — C44209 Unspecified malignant neoplasm of skin of left ear and external auricular canal: Secondary | ICD-10-CM | POA: Diagnosis not present

## 2023-11-21 DIAGNOSIS — C770 Secondary and unspecified malignant neoplasm of lymph nodes of head, face and neck: Secondary | ICD-10-CM | POA: Diagnosis not present

## 2023-11-21 DIAGNOSIS — Z510 Encounter for antineoplastic radiation therapy: Secondary | ICD-10-CM | POA: Diagnosis not present

## 2023-11-21 LAB — RAD ONC ARIA SESSION SUMMARY
Course Elapsed Days: 27
Plan Fractions Treated to Date: 20
Plan Prescribed Dose Per Fraction: 2 Gy
Plan Total Fractions Prescribed: 30
Plan Total Prescribed Dose: 60 Gy
Reference Point Dosage Given to Date: 40 Gy
Reference Point Session Dosage Given: 2 Gy
Session Number: 20

## 2023-11-22 ENCOUNTER — Ambulatory Visit
Admission: RE | Admit: 2023-11-22 | Discharge: 2023-11-22 | Disposition: A | Source: Ambulatory Visit | Attending: Radiation Oncology

## 2023-11-22 ENCOUNTER — Other Ambulatory Visit: Payer: Self-pay

## 2023-11-22 DIAGNOSIS — C44209 Unspecified malignant neoplasm of skin of left ear and external auricular canal: Secondary | ICD-10-CM | POA: Diagnosis not present

## 2023-11-22 DIAGNOSIS — C770 Secondary and unspecified malignant neoplasm of lymph nodes of head, face and neck: Secondary | ICD-10-CM | POA: Diagnosis not present

## 2023-11-22 DIAGNOSIS — Z510 Encounter for antineoplastic radiation therapy: Secondary | ICD-10-CM | POA: Diagnosis not present

## 2023-11-22 LAB — RAD ONC ARIA SESSION SUMMARY
Course Elapsed Days: 28
Plan Fractions Treated to Date: 21
Plan Prescribed Dose Per Fraction: 2 Gy
Plan Total Fractions Prescribed: 30
Plan Total Prescribed Dose: 60 Gy
Reference Point Dosage Given to Date: 42 Gy
Reference Point Session Dosage Given: 2 Gy
Session Number: 21

## 2023-11-23 ENCOUNTER — Other Ambulatory Visit: Payer: Self-pay

## 2023-11-23 ENCOUNTER — Inpatient Hospital Stay: Admitting: Nutrition

## 2023-11-23 ENCOUNTER — Ambulatory Visit
Admission: RE | Admit: 2023-11-23 | Discharge: 2023-11-23 | Disposition: A | Discharge status: Home / Self Care | Source: Ambulatory Visit | Attending: Radiation Oncology | Admitting: Radiation Oncology

## 2023-11-23 DIAGNOSIS — C770 Secondary and unspecified malignant neoplasm of lymph nodes of head, face and neck: Secondary | ICD-10-CM | POA: Diagnosis not present

## 2023-11-23 DIAGNOSIS — C44209 Unspecified malignant neoplasm of skin of left ear and external auricular canal: Secondary | ICD-10-CM | POA: Diagnosis not present

## 2023-11-23 DIAGNOSIS — Z510 Encounter for antineoplastic radiation therapy: Secondary | ICD-10-CM | POA: Diagnosis not present

## 2023-11-23 LAB — RAD ONC ARIA SESSION SUMMARY
Course Elapsed Days: 29
Plan Fractions Treated to Date: 22
Plan Prescribed Dose Per Fraction: 2 Gy
Plan Total Fractions Prescribed: 30
Plan Total Prescribed Dose: 60 Gy
Reference Point Dosage Given to Date: 44 Gy
Reference Point Session Dosage Given: 2 Gy
Session Number: 22

## 2023-11-23 NOTE — Progress Notes (Signed)
 Nutrition follow-up completed with patient after radiation therapy.  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  He is followed by Dr. Izell.  Final radiation therapy scheduled for October 21.  Weight: 181.8 pounds October 6 via Aria 184.6 pounds September 29 via Aria 186.2 pounds September 22 via Aria  2% weight loss in less than 1 month, concerning.  No new labs  Patient continues to have taste alterations and thickened saliva.  Reports food aromas are problematic.  He is using baking soda and salt water gargle but only 2 or 3 times a day.  Estimates water intake between 48 and 60 ounces a day.  Reports tolerated and tasted mushroom soup, tempura shrimp, red bell peppers, ice cream, and yogurt.  He enjoys iced tea.  Recently started 2 scoops of muscle milk powder 2 times a day which provides 350 cal and 50 g of protein per serving.  Nutrition diagnosis: Food and nutrition related knowledge deficit, improved. Unintended weight loss related to cancer and associated treatments as evidenced by 2% loss over 2 weeks.  Intervention: Reviewed strategies to improve taste alterations. Recommended baking soda and salt water gargles first thing in the morning, prior to meals, at bedtime, and in the middle of the night if he awakens. Continue increased fluids throughout the day. Continue muscle milk powder 2-3 times daily. Encouraged patient to choose foods that are cold or room temperature to minimize food smells.  Monitoring, evaluation, goals: Patient will tolerate adequate calories and protein to minimize weight loss.  Next visit: October 14 after radiation therapy.  **Disclaimer: This note was dictated with voice recognition software. Similar sounding words can inadvertently be transcribed and this note may contain transcription errors which may not have been corrected upon  publication of note.**

## 2023-11-24 ENCOUNTER — Other Ambulatory Visit: Payer: Self-pay

## 2023-11-24 ENCOUNTER — Ambulatory Visit
Admission: RE | Admit: 2023-11-24 | Discharge: 2023-11-24 | Disposition: A | Discharge status: Home / Self Care | Source: Ambulatory Visit | Attending: Radiation Oncology | Admitting: Radiation Oncology

## 2023-11-24 DIAGNOSIS — Z510 Encounter for antineoplastic radiation therapy: Secondary | ICD-10-CM | POA: Diagnosis not present

## 2023-11-24 DIAGNOSIS — C44209 Unspecified malignant neoplasm of skin of left ear and external auricular canal: Secondary | ICD-10-CM | POA: Diagnosis not present

## 2023-11-24 DIAGNOSIS — C770 Secondary and unspecified malignant neoplasm of lymph nodes of head, face and neck: Secondary | ICD-10-CM | POA: Diagnosis not present

## 2023-11-24 LAB — RAD ONC ARIA SESSION SUMMARY
Course Elapsed Days: 30
Plan Fractions Treated to Date: 23
Plan Prescribed Dose Per Fraction: 2 Gy
Plan Total Fractions Prescribed: 30
Plan Total Prescribed Dose: 60 Gy
Reference Point Dosage Given to Date: 46 Gy
Reference Point Session Dosage Given: 2 Gy
Session Number: 23

## 2023-11-25 ENCOUNTER — Ambulatory Visit

## 2023-11-26 ENCOUNTER — Ambulatory Visit

## 2023-11-27 ENCOUNTER — Other Ambulatory Visit: Payer: Self-pay

## 2023-11-27 ENCOUNTER — Ambulatory Visit
Admission: RE | Admit: 2023-11-27 | Discharge: 2023-11-27 | Disposition: A | Source: Ambulatory Visit | Attending: Radiation Oncology

## 2023-11-27 ENCOUNTER — Ambulatory Visit
Admission: RE | Admit: 2023-11-27 | Discharge: 2023-11-27 | Disposition: A | Discharge status: Home / Self Care | Source: Ambulatory Visit | Attending: Radiation Oncology | Admitting: Radiation Oncology

## 2023-11-27 DIAGNOSIS — C44209 Unspecified malignant neoplasm of skin of left ear and external auricular canal: Secondary | ICD-10-CM | POA: Diagnosis not present

## 2023-11-27 DIAGNOSIS — Z510 Encounter for antineoplastic radiation therapy: Secondary | ICD-10-CM | POA: Diagnosis not present

## 2023-11-27 DIAGNOSIS — C770 Secondary and unspecified malignant neoplasm of lymph nodes of head, face and neck: Secondary | ICD-10-CM | POA: Diagnosis not present

## 2023-11-27 LAB — RAD ONC ARIA SESSION SUMMARY
Course Elapsed Days: 33
Plan Fractions Treated to Date: 24
Plan Prescribed Dose Per Fraction: 2 Gy
Plan Total Fractions Prescribed: 30
Plan Total Prescribed Dose: 60 Gy
Reference Point Dosage Given to Date: 48 Gy
Reference Point Session Dosage Given: 2 Gy
Session Number: 24

## 2023-11-28 ENCOUNTER — Ambulatory Visit
Admission: RE | Admit: 2023-11-28 | Discharge: 2023-11-28 | Disposition: A | Source: Ambulatory Visit | Attending: Radiation Oncology

## 2023-11-28 ENCOUNTER — Inpatient Hospital Stay: Admitting: Nutrition

## 2023-11-28 ENCOUNTER — Other Ambulatory Visit: Payer: Self-pay

## 2023-11-28 DIAGNOSIS — C44209 Unspecified malignant neoplasm of skin of left ear and external auricular canal: Secondary | ICD-10-CM | POA: Diagnosis not present

## 2023-11-28 DIAGNOSIS — Z510 Encounter for antineoplastic radiation therapy: Secondary | ICD-10-CM | POA: Diagnosis not present

## 2023-11-28 DIAGNOSIS — C770 Secondary and unspecified malignant neoplasm of lymph nodes of head, face and neck: Secondary | ICD-10-CM | POA: Diagnosis not present

## 2023-11-28 LAB — RAD ONC ARIA SESSION SUMMARY
Course Elapsed Days: 34
Plan Fractions Treated to Date: 25
Plan Prescribed Dose Per Fraction: 2 Gy
Plan Total Fractions Prescribed: 30
Plan Total Prescribed Dose: 60 Gy
Reference Point Dosage Given to Date: 50 Gy
Reference Point Session Dosage Given: 2 Gy
Session Number: 25

## 2023-11-28 NOTE — Progress Notes (Signed)
 Nutrition follow-up completed with patient after radiation therapy for secondary malignant neoplasm of the cervical lymph node status post radical neck dissection receiving radiation therapy to left parotid.  He is followed by Dr. Izell.  Final radiation therapy is scheduled for October 21.  He does not have a feeding tube.  Weight: 183.4 pounds October 13 181.8 pounds October 6 184.6 pounds September 29  No new labs.  Patient continues to have dry mouth and taste alterations.  He has noticed thickened saliva.  Food aromas continue to be problematic.  He is using baking soda and salt water gargles.  He tried Metaqil and thinks it helped.  Believes he drinks adequate water.  He is drinking muscle milk several times a day which provides 350 cal and 50 g of protein per serving.  Nutrition diagnosis:  Food and nutrition related knowledge deficit, improved. Unintended weight loss, improved.  Intervention: Continue strategies for increased calories and protein throughout the day. Continue baking soda and salt water gargles. Continue to push fluids. Continue strategies to improve taste and smell changes.  Monitoring, evaluation, goals: Patient will tolerate adequate calories and protein to minimize weight loss.  Next visit: Thursday, October 23 with Charles Solis after MD visit .  **Disclaimer: This note was dictated with voice recognition software. Similar sounding words can inadvertently be transcribed and this note may contain transcription errors which may not have been corrected upon publication of note.**

## 2023-11-29 ENCOUNTER — Other Ambulatory Visit: Payer: Self-pay

## 2023-11-29 ENCOUNTER — Ambulatory Visit
Admission: RE | Admit: 2023-11-29 | Discharge: 2023-11-29 | Disposition: A | Source: Ambulatory Visit | Attending: Radiation Oncology

## 2023-11-29 DIAGNOSIS — C770 Secondary and unspecified malignant neoplasm of lymph nodes of head, face and neck: Secondary | ICD-10-CM | POA: Diagnosis not present

## 2023-11-29 DIAGNOSIS — Z510 Encounter for antineoplastic radiation therapy: Secondary | ICD-10-CM | POA: Diagnosis not present

## 2023-11-29 DIAGNOSIS — C44209 Unspecified malignant neoplasm of skin of left ear and external auricular canal: Secondary | ICD-10-CM | POA: Diagnosis not present

## 2023-11-29 LAB — RAD ONC ARIA SESSION SUMMARY
Course Elapsed Days: 35
Plan Fractions Treated to Date: 26
Plan Prescribed Dose Per Fraction: 2 Gy
Plan Total Fractions Prescribed: 30
Plan Total Prescribed Dose: 60 Gy
Reference Point Dosage Given to Date: 52 Gy
Reference Point Session Dosage Given: 2 Gy
Session Number: 26

## 2023-11-30 ENCOUNTER — Other Ambulatory Visit: Payer: Self-pay

## 2023-11-30 ENCOUNTER — Ambulatory Visit
Admission: RE | Admit: 2023-11-30 | Discharge: 2023-11-30 | Disposition: A | Discharge status: Home / Self Care | Source: Ambulatory Visit | Attending: Radiation Oncology | Admitting: Radiation Oncology

## 2023-11-30 DIAGNOSIS — C770 Secondary and unspecified malignant neoplasm of lymph nodes of head, face and neck: Secondary | ICD-10-CM | POA: Diagnosis not present

## 2023-11-30 DIAGNOSIS — C44209 Unspecified malignant neoplasm of skin of left ear and external auricular canal: Secondary | ICD-10-CM | POA: Diagnosis not present

## 2023-11-30 DIAGNOSIS — Z510 Encounter for antineoplastic radiation therapy: Secondary | ICD-10-CM | POA: Diagnosis not present

## 2023-11-30 LAB — RAD ONC ARIA SESSION SUMMARY
Course Elapsed Days: 36
Plan Fractions Treated to Date: 27
Plan Prescribed Dose Per Fraction: 2 Gy
Plan Total Fractions Prescribed: 30
Plan Total Prescribed Dose: 60 Gy
Reference Point Dosage Given to Date: 54 Gy
Reference Point Session Dosage Given: 2 Gy
Session Number: 27

## 2023-12-01 ENCOUNTER — Ambulatory Visit
Admission: RE | Admit: 2023-12-01 | Discharge: 2023-12-01 | Disposition: A | Source: Ambulatory Visit | Attending: Radiation Oncology

## 2023-12-01 ENCOUNTER — Other Ambulatory Visit: Payer: Self-pay

## 2023-12-01 ENCOUNTER — Ambulatory Visit

## 2023-12-01 DIAGNOSIS — C770 Secondary and unspecified malignant neoplasm of lymph nodes of head, face and neck: Secondary | ICD-10-CM | POA: Diagnosis not present

## 2023-12-01 DIAGNOSIS — Z510 Encounter for antineoplastic radiation therapy: Secondary | ICD-10-CM | POA: Diagnosis not present

## 2023-12-01 DIAGNOSIS — C44209 Unspecified malignant neoplasm of skin of left ear and external auricular canal: Secondary | ICD-10-CM | POA: Diagnosis not present

## 2023-12-01 LAB — RAD ONC ARIA SESSION SUMMARY
Course Elapsed Days: 37
Plan Fractions Treated to Date: 28
Plan Prescribed Dose Per Fraction: 2 Gy
Plan Total Fractions Prescribed: 30
Plan Total Prescribed Dose: 60 Gy
Reference Point Dosage Given to Date: 56 Gy
Reference Point Session Dosage Given: 2 Gy
Session Number: 28

## 2023-12-04 ENCOUNTER — Other Ambulatory Visit: Payer: Self-pay

## 2023-12-04 ENCOUNTER — Ambulatory Visit
Admission: RE | Admit: 2023-12-04 | Discharge: 2023-12-04 | Disposition: A | Discharge status: Home / Self Care | Source: Ambulatory Visit | Attending: Radiation Oncology | Admitting: Radiation Oncology

## 2023-12-04 DIAGNOSIS — C770 Secondary and unspecified malignant neoplasm of lymph nodes of head, face and neck: Secondary | ICD-10-CM | POA: Diagnosis not present

## 2023-12-04 DIAGNOSIS — C44209 Unspecified malignant neoplasm of skin of left ear and external auricular canal: Secondary | ICD-10-CM | POA: Diagnosis not present

## 2023-12-04 DIAGNOSIS — Z510 Encounter for antineoplastic radiation therapy: Secondary | ICD-10-CM | POA: Diagnosis not present

## 2023-12-04 LAB — RAD ONC ARIA SESSION SUMMARY
Course Elapsed Days: 40
Plan Fractions Treated to Date: 29
Plan Prescribed Dose Per Fraction: 2 Gy
Plan Total Fractions Prescribed: 30
Plan Total Prescribed Dose: 60 Gy
Reference Point Dosage Given to Date: 58 Gy
Reference Point Session Dosage Given: 2 Gy
Session Number: 29

## 2023-12-04 MED ORDER — SILVER SULFADIAZINE 1 % EX CREA: TOPICAL_CREAM | Freq: Once | CUTANEOUS | Status: AC

## 2023-12-05 ENCOUNTER — Ambulatory Visit

## 2023-12-05 ENCOUNTER — Other Ambulatory Visit: Payer: Self-pay

## 2023-12-05 ENCOUNTER — Ambulatory Visit
Admission: RE | Admit: 2023-12-05 | Discharge: 2023-12-05 | Disposition: A | Discharge status: Home / Self Care | Source: Ambulatory Visit | Attending: Radiation Oncology | Admitting: Radiation Oncology

## 2023-12-05 DIAGNOSIS — Z510 Encounter for antineoplastic radiation therapy: Secondary | ICD-10-CM | POA: Diagnosis not present

## 2023-12-05 DIAGNOSIS — C44209 Unspecified malignant neoplasm of skin of left ear and external auricular canal: Secondary | ICD-10-CM | POA: Diagnosis not present

## 2023-12-05 DIAGNOSIS — C770 Secondary and unspecified malignant neoplasm of lymph nodes of head, face and neck: Secondary | ICD-10-CM | POA: Diagnosis not present

## 2023-12-05 LAB — RAD ONC ARIA SESSION SUMMARY
Course Elapsed Days: 41
Plan Fractions Treated to Date: 30
Plan Prescribed Dose Per Fraction: 2 Gy
Plan Total Fractions Prescribed: 30
Plan Total Prescribed Dose: 60 Gy
Reference Point Dosage Given to Date: 60 Gy
Reference Point Session Dosage Given: 2 Gy
Session Number: 30

## 2023-12-05 NOTE — Progress Notes (Signed)
 Oncology Nurse Navigator Documentation   Mr. Charles Solis completed his radiation treatments today. He knows to contact me if he has any concerns or questions at any time. He will see Dr. Autumn on 10/23 with medical oncology and Ronita Due PA on 11/4 for radiation treatment follow up  [facility] RN, BSN, OCN Head & Neck Oncology Nurse Navigator [facility] at [facility] Phone # [phone]  Fax # [phone]

## 2023-12-06 ENCOUNTER — Ambulatory Visit: Attending: Radiation Oncology

## 2023-12-06 ENCOUNTER — Ambulatory Visit

## 2023-12-06 DIAGNOSIS — R471 Dysarthria and anarthria: Secondary | ICD-10-CM | POA: Diagnosis not present

## 2023-12-06 DIAGNOSIS — R1311 Dysphagia, oral phase: Secondary | ICD-10-CM | POA: Diagnosis not present

## 2023-12-06 NOTE — Therapy (Signed)
 OUTPATIENT SPEECH LANGUAGE PATHOLOGY EVALUATION   Patient Name: Charles Solis MRN: [id number] DOB:06/05/1950, 73 y.o., male Today's Date: 12/06/2023  PCP: None noted in Epic REFERRING PROVIDER: Izell Domino, MD  END OF SESSION:  End of Session - 12/06/23 1326     Visit Number 2    Number of Visits 4    Date for Recertification  01/29/24    SLP Start Time 1321    SLP Stop Time  1400    SLP Time Calculation (min) 39 min    Activity Tolerance Patient tolerated treatment well          Past Medical History:  Diagnosis Date   High cholesterol    Hypothyroidism    Prostate cancer [facility])    Past Surgical History:  Procedure Laterality Date   HERNIA REPAIR     inguinal   MOHS SURGERY     head,ear,cheek,nose   OTOPLASATY Left 09/13/2023   Procedure: OTOPLASTY;  Surgeon: Jesus Oliphant, MD;  Location: [facility] OR;  Service: ENT;  Laterality: Left;  Left wedge resection of pinna with primary closure.   PAROTIDECTOMY Left 09/13/2023   Procedure: EXCISION, PAROTID GLAND;  Surgeon: Jesus Oliphant, MD;  Location: [facility] OR;  Service: ENT;  Laterality: Left;   RADICAL NECK DISSECTION Left 09/13/2023   Procedure: DISSECTION, NECK, RADICAL;  Surgeon: Jesus Oliphant, MD;  Location: [facility] OR;  Service: ENT;  Laterality: Left;   TONSILLECTOMY     Patient Active Problem List   Diagnosis Date Noted   Cancer of skin of left ear 10/04/2023   Skin cancer 10/04/2023   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   Sensorineural hearing loss, bilateral 05/23/2023   HTN (hypertension) 11/16/2018   Hyperlipidemia 09/24/2014    ONSET DATE: see pertinent history below  REFERRING DIAG:  C77.0 (ICD-10-CM) - Secondary malignant neoplasm of cervical lymph node (HCC)    THERAPY DIAG:  Dysarthria and anarthria  Oral phase dysphagia  Rationale for Evaluation and Treatment: Rehabilitation  SUBJECTIVE:   SUBJECTIVE STATEMENT: I am better at saying the /s/ and the sh at the  beginning of words.  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 A few months ago he developed a lesion located on his left jaw area. Lesion had continued to increase in size. To investigate his symptoms he underwent a CT neck on 07/18/23 showing an ovoid centrally hypodense lesion present posteriorly within or abutting the superficial lobe of the left parotid, measuring approximately 22 x 17 x 18 mm.  08/31/23 He underwent a FNA. Pathology indicated the presence of malignant cells, carcinoma with squamous differentiation with no lymphoid tissue identified. 09/04/23 He presented to Dr. Jesus for second opinion. Based on his recommendations, patient underwent a parotidectomy with radical neck dissection on 09/13/23. Surgical pathology redemonstrated tumor size of 3.5 x 3.2 x 2.3 cm and histology of invasive moderately differentiated squamous cell carcinoma with tumor present in dermis and infiltrates the subcutaneous adipose to superficially invade the underlying parotid gland. Tumor present in 3:00 margin but is negative for angiolymphatic and perineural invasion. Neck dissection pathology of 20 lymph nodes are negative for carcinoma.09/28/23 PET showing no evidence of metastatic disease. 10/03/23 Consult with Dr. Izell. 10/06/23 Consult with Dr. Autumn. He will receive radiation then after it's completed immunotherapy with Libtayo every 3 weeks for one year.  Treatment plan:  He will receive 33 fractions of radiation to his left  parotid which will start on 10/25/23 and will complete 12/11/23.Pretreatment procedures:09/13/23 Left Parotidectomy  PAIN:  Are you having pain? No  FALLS: Has patient fallen in last 6 months?  See PT evaluation for details   PATIENT GOALS: Improve speech clarity  OBJECTIVE:  Note: Objective measures were completed at Evaluation unless otherwise noted.   resolved.  PATIENT REPORTED OUTCOME MEASURES  (PROM): Speech (long form): to be provided in first session                                                                                                                            TREATMENT DATE:   12/06/23: Pt needs PROM next session.  SPEECH: SLP worked with pt today about making his speech as clear as possible, with targeted repeats suggested by SLP for those productions not as clear as possible. Pt self-corrected x5 today. SLP focused pt on his overarticulation in both use of word list and in conversation. SLP told pt to cont to practice with focus on accuracy more than speed of production.  SWALLOWING: Pt has had potato soup with chunks of potato, vanilla wafers, creamed soups, sweet potato fries. Protien drinks are also prevalent now in pt's diet. Bolus manip cont to be functional, but not WNL for pt. Biggest difficulty pt having is food gumming up and making it hard to manipulate.  10/31/23: Today after evaluation SLP instructed pt about and taught pt a home practice program including exercises, and compensation of overarticulation.when talking to improve articulatory precision.   PATIENT EDUCATION: Education details: see treatment date Person educated: Patient Education method: Explanation, Demonstration, Verbal cues, and Handouts Education comprehension: verbalized understanding, returned demonstration, verbal cues required, and needs further education  HOME EXERCISE PROGRAM: Provided today - see pt instructions   GOALS: Goals reviewed with patient? Yes  SHORT TERM GOALS: Target date: 12/04/23  Pt will improve articulation to limit misarticulations to 7 in 5 minutes conversation  Baseline: Goal status: INITIAL  2.  Pt will complete HEP with min A Baseline:  Goal status: met  3.  Pt will be able to tell SLP rationale for using speech compensations Baseline:  Goal status: met    LONG TERM GOALS: Target date: 01/04/24  Pt will improve PROM compared with initial  administration Baseline:  Goal status: INITIAL  2.  Pt will complete HEP with modified independence Baseline:  Goal status: INITIAL  3.  Pt will improve articulation to limit misarticulations to 3 in 5 minutes conversation Baseline:  Goal status: INITIAL   ASSESSMENT:  CLINICAL IMPRESSION: Patient is a 73 y.o. M who was seen today for treatment of speech following removal of a parotid gland tumor in July. Signe states that his speech has improved since last session. This has improved since evaluation. He has cont'd his HEP at suggested frequency to work on these phonemes in words in differing positions. Oral dysphagia, he stated, is not a primary concern at this time. SLP continuing to  monitor his swallowing to ensure safety while eating.  OBJECTIVE IMPAIRMENTS: Objective impairments include dysarthria and dysphagia. These impairments are limiting patient from ADLs/IADLs, effectively communicating at home and in community, and safety when swallowing.Factors affecting potential to achieve goals and functional outcome are surgical changes.. Patient will benefit from skilled SLP services to address above impairments and improve overall function.  REHAB POTENTIAL: Good  PLAN:  SLP FREQUENCY: once approx every 4 weeks   SLP DURATION: 3 sessions  PLANNED INTERVENTIONS: Oral motor exercises, SLP instruction and feedback, Compensatory strategies, Patient/family education, and [id number] Treatment of speech (30 or 45 min)     Zakiyyah Savannah, CCC-SLP 12/06/2023, 1:27 PM

## 2023-12-06 NOTE — Radiation Completion Notes (Signed)
 Patient Name: Charles Solis, Charles Solis MRN: [id number] Date of Birth: [birth year] Referring Physician: IDA LOADER, M.D. Date of Service: 2023-12-06 Radiation Oncologist: Lauraine Golden, M.D. [facility] -                              RADIATION ONCOLOGY END OF TREATMENT NOTE     Diagnosis: C77.0 Secondary and unspecified malignant neoplasm of lymph nodes of head, face and neck Staging on 2023-10-04: Skin cancer T=pTX, N=pN3b, M=cM0 Intent: Curative     ==========DELIVERED PLANS==========  First Treatment Date: 2023-10-25 Last Treatment Date: 2023-12-05   Plan Name: HN_L_Parotid Site: Parotid, Left Technique: IMRT Mode: Photon Dose Per Fraction: 2 Gy Prescribed Dose (Delivered / Prescribed): 60 Gy / 60 Gy Prescribed Fxs (Delivered / Prescribed): 30 / 30     ==========ON TREATMENT VISIT DATES========== 2023-10-30, 2023-11-06, 2023-11-13, 2023-11-20, 2023-11-27, 2023-12-04     ==========UPCOMING VISITS========== 01/31/2024 [facility] TREATMENT Jacelyn Lupita KATHEE RAEJEAN  01/02/2024 [facility] REH AT [address] [place] Florina CROME, [place]  12/21/2023 [facility] OFFICE VISIT Kate Lonni CROME, [place]  12/19/2023 [facility]-RADIATION ONC FOLLOW UP 30 Wyatt Leeroy HERO, [place]  12/11/2023 [facility]-PRESURGICAL TESTING PRE-ADMISSION TESTING 60 [facility]PADML PAT 1  12/07/2023 [facility] ONCOLOGY EST PT 15 Pasam, Avinash, MD  12/07/2023 [facility] ONCOLOGY LAB [facility] LAB  12/07/2023 [facility] ONCOLOGY NUT 45 Ivonne Harlene RAMAN, [place]  12/06/2023 [facility] NE[facility] NE[facility] ST TREATMENT Schinke, Lupita KATHEE, CCC-SLP        ==========APPENDIX - ON TREATMENT VISIT NOTES==========   See weekly On Treatment Notes in Epic for details in the Media tab (listed as Progress notes on the On Treatment Visit Dates listed above).

## 2023-12-07 ENCOUNTER — Ambulatory Visit: Admitting: Oncology

## 2023-12-07 ENCOUNTER — Inpatient Hospital Stay: Admitting: Oncology

## 2023-12-07 ENCOUNTER — Encounter: Payer: Self-pay | Admitting: Oncology

## 2023-12-07 ENCOUNTER — Other Ambulatory Visit

## 2023-12-07 ENCOUNTER — Inpatient Hospital Stay: Admitting: Nutrition

## 2023-12-07 ENCOUNTER — Other Ambulatory Visit: Payer: Self-pay

## 2023-12-07 ENCOUNTER — Ambulatory Visit

## 2023-12-07 ENCOUNTER — Inpatient Hospital Stay: Admitting: Dietician

## 2023-12-07 ENCOUNTER — Inpatient Hospital Stay

## 2023-12-07 VITALS — BP 111/69 | HR 75 | Temp 97.6°F | Resp 15 | Ht 73.0 in | Wt 178.1 lb

## 2023-12-07 DIAGNOSIS — C44209 Unspecified malignant neoplasm of skin of left ear and external auricular canal: Secondary | ICD-10-CM | POA: Diagnosis not present

## 2023-12-07 DIAGNOSIS — Z79899 Other long term (current) drug therapy: Secondary | ICD-10-CM | POA: Insufficient documentation

## 2023-12-07 DIAGNOSIS — Z8546 Personal history of malignant neoplasm of prostate: Secondary | ICD-10-CM | POA: Insufficient documentation

## 2023-12-07 DIAGNOSIS — R5381 Other malaise: Secondary | ICD-10-CM

## 2023-12-07 DIAGNOSIS — Z85828 Personal history of other malignant neoplasm of skin: Secondary | ICD-10-CM | POA: Diagnosis not present

## 2023-12-07 DIAGNOSIS — C799 Secondary malignant neoplasm of unspecified site: Secondary | ICD-10-CM

## 2023-12-07 DIAGNOSIS — Z7982 Long term (current) use of aspirin: Secondary | ICD-10-CM | POA: Diagnosis not present

## 2023-12-07 DIAGNOSIS — C770 Secondary and unspecified malignant neoplasm of lymph nodes of head, face and neck: Secondary | ICD-10-CM

## 2023-12-07 DIAGNOSIS — C4490 Unspecified malignant neoplasm of skin, unspecified: Secondary | ICD-10-CM

## 2023-12-07 DIAGNOSIS — Z923 Personal history of irradiation: Secondary | ICD-10-CM | POA: Insufficient documentation

## 2023-12-07 DIAGNOSIS — C4449 Other specified malignant neoplasm of skin of scalp and neck: Secondary | ICD-10-CM | POA: Insufficient documentation

## 2023-12-07 DIAGNOSIS — R3912 Poor urinary stream: Secondary | ICD-10-CM | POA: Diagnosis not present

## 2023-12-07 DIAGNOSIS — N401 Enlarged prostate with lower urinary tract symptoms: Secondary | ICD-10-CM | POA: Diagnosis not present

## 2023-12-07 DIAGNOSIS — R351 Nocturia: Secondary | ICD-10-CM | POA: Diagnosis not present

## 2023-12-07 LAB — TSH: TSH: 0.895 u[IU]/mL (ref 0.350–4.500)

## 2023-12-07 MED ORDER — ONDANSETRON HCL 8 MG PO TABS: 8.0000 mg | ORAL_TABLET | Freq: Three times a day (TID) | ORAL | 1 refills | Status: AC | PRN

## 2023-12-07 MED ORDER — LIDOCAINE-PRILOCAINE 2.5-2.5 % EX CREA: TOPICAL_CREAM | CUTANEOUS | 3 refills | Status: AC

## 2023-12-07 MED ORDER — PROCHLORPERAZINE MALEATE 10 MG PO TABS: 10.0000 mg | ORAL_TABLET | Freq: Four times a day (QID) | ORAL | 1 refills | Status: AC | PRN

## 2023-12-07 NOTE — Progress Notes (Signed)
 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 Patient completed radiation to left parotid 10/21 under the care of Dr. Izell. Planning for libtayo infusions q21d starting 11/19. Patient under the care of Dr. Autumn.   Met with patient and wife in office. Patient is glad to have radiation behind him. Endorses increased fatigue. Patient taking 10 minute naps during the day. Sleeping well overnight. His mouth is dry. Biotene spray several times daily is working well. He continues to use baking soda salt water gargles. Patient taste is altered and sensitivity to smells continue. Patient having difficulties finding foods that taste good. Recalls soba noodles with vegetables last night which tasted really good. He is drinking a protein shake at breakfast. Patient says he is drinking lots of water.    Medications: reviewed   Labs: no new labs   Anthropometrics: Wt 178 lb 1.6 oz today decreased 2.7% in 10 days which is severe for time frame  10/13 - 183.4 lb 10/6 - 181.8 lb 9/29 - 184.6 lb    NUTRITION DIAGNOSIS: Unintended wt loss - continues    INTERVENTION:  Reinforced education on intake of adequate calorie/protein energy to support healing Encourage leaning into flavors that are working (adding salt, herbs, seasonings to aid with flavor enhancement) Suggested putting lid on bone broth/soups to decrease aroma  Encourage activity as able     MONITORING, EVALUATION, GOAL: wt trends, intake   NEXT VISIT: Wednesday November 13 during infusion

## 2023-12-07 NOTE — Progress Notes (Signed)
 [facility]  ONCOLOGY CLINIC PROGRESS NOTE   Patient Care Team: Patient, No Pcp Per as PCP - General (General Practice) Nahser, Aleene PARAS, MD (Inactive) as PCP - Cardiology (Cardiology) Izell Domino, MD as Attending Physician (Radiation Oncology) Autumn Millman, MD as Consulting Physician (Oncology) Jesus Oliphant, MD as Consulting Physician (Otolaryngology) Malmfelt, Delon CROME, RN as Oncology Nurse Navigator  PATIENT NAME: Charles Solis   MR#: [id number] DOB: 26-Sep-1950  Date of visit: 12/07/2023   ASSESSMENT & PLAN:   Charles Solis is a 73 y.o.  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   Post-operative status with positive deep margin, necessitating adjuvant radiation therapy.   No evidence of metastasis, but considered locally advanced due to depth of involvement.   Completed adjuvant radiation treatments on 12/05/2023.  Immunotherapy with cemiplimab proposed post-radiation to prevent recurrence and manage any residual disease.   Discussed the mechanism of action of cemiplimab as an immunotherapy that unmasks cancer cells to the immune system. Potential side effects include immune-mediated organ damage, with thyroid  dysfunction being the most common. Severe side effects are rare, occurring in less than 0.5% of cases. The decision to use cemiplimab is based on its efficacy in preventing recurrence in similar cases and its role as a standard of care in such scenarios.  Treatment planned to begin around January 03, 2024, after port placement and education session. Immunotherapy expected to last 1-2 years, depending on response and tolerance. Aims to enhance immune  system's ability to recognize and attack cancer cells. Potential side effects include autoimmune reactions, with thyroid  dysfunction being most common. Regular monitoring for side effects, managed with steroids and treatment breaks as needed.  - Conduct follow-up imaging (CT or PET scan) every three months to monitor for recurrence or metastasis.   Post-radiation fatigue and weight loss Completed radiation therapy on December 05, 2023. Experiencing fatigue, decreased appetite, and altered taste. Lost approximately 12 pounds, within expected range post-radiation. Symptoms expected to gradually improve, though may not return to complete normalcy.  Radiation-induced dermatitis Weeping at the bottom of the earlobe due to radiation therapy. Using antibiotic cream prescribed by Dr. Izell since Monday. Condition expected to resolve as the body heals from radiation.  Transurethral Resection of the Prostate (TURP) Scheduled for TURP on December 22, 2023, at [facility]. Procedure involves overnight hospital stay and three-day catheterization. Prefers to delay immunotherapy until post-TURP. - Proceed with TURP on December 22, 2023. - Delay immunotherapy until after TURP recovery.  I reviewed lab results and outside records for this visit and discussed relevant results with the patient. Diagnosis, plan of care and treatment options were also discussed in detail with the patient. Opportunity provided to ask questions and answers provided to his apparent satisfaction. Provided instructions to call our clinic with any problems, questions or concerns prior to return visit. I recommended to continue follow-up with PCP and sub-specialists. He verbalized understanding and agreed with the plan.   NCCN guidelines have been consulted in the planning of this patient's care.  I spent a total of 32 minutes during this encounter with the patient including review of chart and various tests results, discussions about plan  of care and coordination of  care plan.   Charles Patten, MD  12/07/2023 12:56 PM  [facility] - A DEPT OF 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  Current Treatment: Received adjuvant radiation for positive deep margin.  Completed radiation treatments on 12/05/2023.  Given locally advanced nature, proposed adjuvant treatments with cemiplimab and patient is agreeable.  INTERVAL HISTORY:    Discussed the use of AI scribe software for clinical note transcription with the patient, who gave verbal consent to proceed.  History of Present Illness Charles Solis is a 73 year old male who presents for follow-up after completing radiation therapy.  He completed radiation therapy on November 05, 2023. Since then, he has experienced significant fatigue, decreased appetite, and a negative change in his sense of taste. He is very sensitive to smells but not to taste. He has lost approximately 12 pounds, with his weight dropping from 185 pounds to 173 pounds as of this morning.  He is scheduled for a transurethral resection of the prostate (TURP) on December 22, 2023, at [facility], which will involve an overnight stay and three days with a catheter.  He reports some weeping from the bottom of his earlobe, which he attributes to radiation. He has been applying an antibiotic cream since last Monday and has been cleaning the area daily. He also experiences itching in the ear canal.      I have reviewed the past medical history, past surgical history, social history and family history with the patient and they are unchanged from previous note.  HISTORY OF PRESENT ILLNESS:   ONCOLOGY HISTORY:   He has a history  significant for prostate cancer and skin cancer with multiple recurrences. The most recent skin cancer removal was Mohs surgery to the left pinna about 3 years ago.    A few months ago, patient developed a lesion located on his left jaw area. Lesion has since continued increasing in size. To further investigate his symptoms, he underwent a CT neck on 07/18/23 showing an ovoid centrally hypodense lesion present posteriorly within or abutting the superficial lobe of the left parotid, measuring approximately 22 x 17 x 18 mm.    In light of findings, he then underwent a fine-needle aspiration biopsy on 08/31/23. Pathology indicated the presence of malignant cells, carcinoma with squamous differentiation, with no lymphoid tissue identified.   Patient then presented for a second-opinion with Dr. Jesus on 09/04/23. Based on his recommendations, patient underwent a parotidectomy with radical neck dissection on 09/13/23. Surgical pathology redemonstrated tumor size of 3.5 x 3.2 x 2.3 cm  and histology of  invasive moderately differentiated squamous cell carcinoma with tumor present in dermis and infiltrates the subcutaneous adipose to superficially invade the underlying parotid gland. Tumor present in 3:00 margin but is negative for angiolymphatic and perineural invasion. Neck dissection pathology of 20 lymph nodes are negative for carcinoma.             Staging PET scan on 09/28/2023 showed left sided neck hypermetabolic some without well-defined correlate mass or adenopathy and is likely postoperative.  No evidence of extracervical suspicious activity.  Incidental findings include a 12 mm bladder stone and evidence of CAD and aortic atherosclerosis.  4 mm right lower lobe lung nodule,  below PET resolution.   Positive deep margin, necessitating adjuvant radiation therapy.  Completed adjuvant radiation treatments on 12/05/2023.   No evidence of metastasis, but considered locally advanced due to depth of involvement.     Immunotherapy with cemiplimab proposed post-radiation to prevent recurrence and manage any residual disease.  Patient agreeable with this approach.  He is undergoing TURP for prostate issue on 12/22/2023.    Plan to begin cemiplimab treatments 2 weeks after that.  Oncology History  Skin cancer  10/04/2023 Initial Diagnosis   Skin cancer   10/04/2023 Cancer Staging   Staging form: Cutaneous Carcinoma of the Head and Neck, AJCC 8th Edition - Pathologic stage from 10/04/2023: Stage IV (rpTX, rpN3b, rcM0) - Signed by Izell Domino, MD on 10/04/2023 Stage prefix: Recurrence Extraosseous extension: Unknown   01/03/2024 -  Chemotherapy   Patient is on Treatment Plan : ADVANCED CUTANEOUS SQUAMOUS CELL CARCINOMA Cemiplimab q21d         REVIEW OF SYSTEMS:   Review of Systems - Oncology  All other pertinent systems were reviewed with the patient and are negative.  ALLERGIES: He has no known allergies.  MEDICATIONS:  Current Outpatient Medications  Medication Sig Dispense Refill   aspirin EC 81 MG tablet Take 81 mg by mouth daily. Swallow whole.     cyanocobalamin  (VITAMIN B12) 1000 MCG tablet Take 1,000 mcg by mouth daily.     ezetimibe  (ZETIA ) 10 MG tablet TAKE ONE TABLET BY MOUTH EVERY DAY 90 tablet 3   fexofenadine (ALLEGRA) 180 MG tablet Take 180 mg by mouth daily.     finasteride  (PROSCAR ) 5 MG tablet Take 5 mg by mouth daily.     ipratropium (ATROVENT ) 0.03 % nasal spray Place 1 spray into both nostrils daily.     metoprolol  tartrate (LOPRESSOR ) 25 MG tablet Take 1 tablet (25 mg total) by mouth 2 (two) times daily. 180 tablet 1   rosuvastatin  (CRESTOR ) 20 MG tablet Take 1 tablet (20 mg total) by mouth daily. 90 tablet 3   Saw Palmetto  500 MG CAPS Take 1 capsule 3 (three) times daily by mouth.     silodosin (RAPAFLO) 8 MG CAPS capsule Take 8 mg by mouth daily with breakfast.     tadalafil  (CIALIS ) 5 MG tablet Take 1 tablet by mouth daily.     lidocaine -prilocaine (EMLA) cream  Apply to affected area once 30 g 3   ondansetron  (ZOFRAN ) 8 MG tablet Take 1 tablet (8 mg total) by mouth every 8 (eight) hours as needed for nausea or vomiting. 30 tablet 1   prochlorperazine (COMPAZINE) 10 MG tablet Take 1 tablet (10 mg total) by mouth every 6 (six) hours as needed for nausea or vomiting. 30 tablet 1   No current facility-administered medications for this visit.     VITALS:   Blood pressure 111/69, pulse 75, temperature 97.6 F (36.4 C), temperature source Temporal, resp. rate 15, height 6' 1 (1.854 m), weight 178 lb 1.6 oz (80.8 kg), SpO2 100%.  Wt Readings from Last 3 Encounters:  12/07/23 178 lb 1.6 oz (80.8 kg)  10/25/23 188 lb (85.3 kg)  10/18/23 192 lb (87.1 kg)    Body mass index is 23.5 kg/m.    Onc Performance Status - 12/07/23 0900       ECOG Perf Status   ECOG Perf Status Restricted in physically strenuous activity but ambulatory and able to carry out work of a light or sedentary nature, e.g., light house work, office work  KPS SCALE   KPS % SCORE Normal activity with effort, some s/s of disease          PHYSICAL EXAM:   Physical Exam Constitutional:      General: He is not in acute distress.    Appearance: Normal appearance.  HENT:     Head: Normocephalic and atraumatic.  Eyes:     Conjunctiva/sclera: Conjunctivae normal.  Neck:     Comments: Scars from  recent carcinoma excision on the left side of neck/parotid area are well-healed.   Mild radiation associated skin changes noted. Cardiovascular:     Rate and Rhythm: Normal rate and regular rhythm.  Pulmonary:     Effort: Pulmonary effort is normal. No respiratory distress.  Abdominal:     General: There is no distension.  Neurological:     General: No focal deficit present.     Mental Status: He is alert and oriented to person, place, and time.  Psychiatric:        Mood and Affect: Mood normal.        Behavior: Behavior normal.       LABORATORY DATA:   I have  reviewed the data as listed.  No results found for any visits on 12/07/23.    RADIOGRAPHIC STUDIES:  No recent pertinent imaging available to review.   CODE STATUS:  Code Status History     Date Active Date Inactive Code Status Order ID Comments User Context   09/13/2023 1247 09/14/2023 1436 Full Code [id number]  Jesus Oliphant, MD Inpatient    Questions for Most Recent Historical Code Status (Order [id number])     Question Answer   By: Other            Orders Placed This Encounter  Procedures   Consent Attestation for Oncology Treatment    The patient is informed of risks, benefits, side-effects of the prescribed oncology treatment. Potential short term and long term side effects and response rates discussed. After a long discussion, the patient made informed decision to proceed.:   Yes   CBC with Differential (Cancer Center Only)    Standing Status:   Future    Expected Date:   01/03/2024    Expiration Date:   01/02/2025   CMP (Cancer Center only)    Standing Status:   Future    Expected Date:   01/03/2024    Expiration Date:   01/02/2025   T4    Standing Status:   Future    Expected Date:   01/03/2024    Expiration Date:   01/02/2025   TSH    Standing Status:   Future    Expected Date:   01/03/2024    Expiration Date:   01/02/2025   CBC with Differential (Cancer Center Only)    Standing Status:   Future    Expected Date:   01/24/2024    Expiration Date:   01/23/2025   CMP (Cancer Center only)    Standing Status:   Future    Expected Date:   01/24/2024    Expiration Date:   01/23/2025   CBC with Differential (Cancer Center Only)    Standing Status:   Future    Expected Date:   02/14/2024    Expiration Date:   02/13/2025   CMP (Cancer Center only)    Standing Status:   Future    Expected Date:   02/14/2024    Expiration Date:   02/13/2025   T4    Standing Status:  Future    Expected Date:   02/14/2024    Expiration Date:   02/13/2025   TSH    Standing  Status:   Future    Expected Date:   02/14/2024    Expiration Date:   02/13/2025   PHYSICIAN COMMUNICATION ORDER    Required labs: Thyroid  function tests at baseline and every 3rd cycle.   ONCBCN PHYSICIAN COMMUNICATION 1    For pathway MELOS101: Treatment Until Progression or Unacceptable Toxicity or up to 93 Weeks, MELOS89: Until Progression or Unacceptable Toxicity.     Future Appointments  Date Time Provider Department Center  12/11/2023  2:00 PM [facility] PAT 1 [facility] None  12/13/2023  9:00 AM [facility] CHEMO EDU [facility] None  12/19/2023  9:30 AM Wyatt Leeroy HERO, PA-C [facility] None  12/21/2023 10:00 AM Kate Lonni CROME, MD [facility] [address]  01/02/2024 10:00 AM Lanis Carbon, Blaire L, PT [facility] None  01/03/2024  8:45 AM [facility] LAB [facility] None  01/03/2024  9:15 AM Laquia Rosano, Chinita, MD [facility] None  01/03/2024  9:45 AM [facility] INFUSION [facility] None  01/03/2024 11:15 AM Ivonne Harlene RAMAN, RD [facility] None  01/31/2024  1:15 PM Schinke, Lupita NOVAK, CCC-SLP [facility]      This document was completed utilizing speech recognition software. Grammatical errors, random word insertions, pronoun errors, and incomplete sentences are an occasional consequence of this system due to software limitations, ambient noise, and hardware issues. Any formal questions or concerns about the content, text or information contained within the body of this dictation should be directly addressed to the provider for clarification.

## 2023-12-07 NOTE — Assessment & Plan Note (Signed)
 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   Post-operative status with positive deep margin, necessitating adjuvant radiation therapy.   No evidence of metastasis, but considered locally advanced due to depth of involvement.   Completed adjuvant radiation treatments on 12/05/2023.  Immunotherapy with cemiplimab proposed post-radiation to prevent recurrence and manage any residual disease.   Discussed the mechanism of action of cemiplimab as an immunotherapy that unmasks cancer cells to the immune system. Potential side effects include immune-mediated organ damage, with thyroid  dysfunction being the most common. Severe side effects are rare, occurring in less than 0.5% of cases. The decision to use cemiplimab is based on its efficacy in preventing recurrence in similar cases and its role as a standard of care in such scenarios.  Treatment planned to begin around January 03, 2024, after port placement and education session. Immunotherapy expected to last 1-2 years, depending on response and tolerance. Aims to enhance immune system's ability to recognize and attack cancer cells. Potential side effects include autoimmune reactions, with thyroid  dysfunction being most common. Regular monitoring for side effects, managed with steroids and treatment breaks as needed.  - Conduct follow-up imaging (CT or PET scan) every three months to monitor for recurrence or metastasis.

## 2023-12-08 ENCOUNTER — Other Ambulatory Visit: Payer: Self-pay

## 2023-12-08 ENCOUNTER — Ambulatory Visit

## 2023-12-08 DIAGNOSIS — N401 Enlarged prostate with lower urinary tract symptoms: Secondary | ICD-10-CM | POA: Diagnosis not present

## 2023-12-11 ENCOUNTER — Other Ambulatory Visit: Payer: Self-pay

## 2023-12-11 ENCOUNTER — Encounter (HOSPITAL_COMMUNITY): Payer: Self-pay

## 2023-12-11 ENCOUNTER — Ambulatory Visit

## 2023-12-11 ENCOUNTER — Encounter (HOSPITAL_COMMUNITY)
Admission: RE | Admit: 2023-12-11 | Discharge: 2023-12-11 | Disposition: A | Discharge status: Home / Self Care | Source: Ambulatory Visit | Attending: Urology | Admitting: Urology

## 2023-12-11 VITALS — BP 137/89 | HR 76 | Temp 97.9°F | Ht 73.0 in | Wt 177.0 lb

## 2023-12-11 DIAGNOSIS — Z01812 Encounter for preprocedural laboratory examination: Secondary | ICD-10-CM | POA: Insufficient documentation

## 2023-12-11 DIAGNOSIS — I10 Essential (primary) hypertension: Secondary | ICD-10-CM | POA: Diagnosis not present

## 2023-12-11 HISTORY — DX: Unspecified osteoarthritis, unspecified site: M19.90

## 2023-12-11 HISTORY — DX: Atherosclerotic heart disease of native coronary artery without angina pectoris: I25.10

## 2023-12-11 LAB — CBC
HCT: 41.7 % (ref 39.0–52.0)
Hemoglobin: 13.7 g/dL (ref 13.0–17.0)
MCH: 29.3 pg (ref 26.0–34.0)
MCHC: 32.9 g/dL (ref 30.0–36.0)
MCV: 89.1 fL (ref 80.0–100.0)
Platelets: 180 K/uL (ref 150–400)
RBC: 4.68 MIL/uL (ref 4.22–5.81)
RDW: 12.7 % (ref 11.5–15.5)
WBC: 4.2 K/uL (ref 4.0–10.5)
nRBC: 0 % (ref 0.0–0.2)

## 2023-12-11 LAB — BASIC METABOLIC PANEL WITH GFR
Anion gap: 8 (ref 5–15)
BUN: 12 mg/dL (ref 8–23)
CO2: 26 mmol/L (ref 22–32)
Calcium: 9.4 mg/dL (ref 8.9–10.3)
Chloride: 104 mmol/L (ref 98–111)
Creatinine, Ser: 0.83 mg/dL (ref 0.61–1.24)
GFR, Estimated: >60 mL/min (ref >60)
Glucose, Bld: 98 mg/dL (ref 70–99)
Potassium: 4.2 mmol/L (ref 3.5–5.1)
Sodium: 137 mmol/L (ref 135–145)

## 2023-12-11 NOTE — Progress Notes (Signed)
 For Anesthesia: PCP - NO PCP. Cardiologist - Kate Lonni CROME, MD  . ARNETTA: 10/25/23  Bowel Prep reminder:  Chest x-ray -  EKG -  Stress Test -  ECHO -  Cardiac Cath -  Pacemaker/ICD device last checked: Pacemaker orders received: Device Rep notified:  Spinal Cord Stimulator:N/A  Sleep Study - N/A CPAP -   Fasting Blood Sugar - N/A Checks Blood Sugar _____ times a day Date and result of last Hgb A1c-  Last dose of GLP1 agonist- N/A GLP1 instructions: Hold 7 days prior to schedule (Hold 24 hours-daily)   Last dose of SGLT-2 inhibitors- N/A SGLT-2 instructions: Hold 72 hours prior to surgery  Blood Thinner Instructions:N/A Last Dose: Time last taken:  Aspirin Instructions: Last Dose: Time last taken:  Activity level: Can go up a flight of stairs and activities of daily living without stopping and without shortness of breath   Able to exercise without chest pain   Anesthesia review:: Hx: CAD,SOB on exertion.   Patient denies shortness of breath, fever, cough and chest pain at PAT appointment   Patient verbalized understanding of instructions that were reviewed over the telephone.

## 2023-12-11 NOTE — Patient Instructions (Signed)
 SURGICAL WAITING ROOM VISITATION Patients having surgery or a procedure may have no more than 2 support people in the waiting area - these visitors may rotate in the visitor waiting room.   Due to an increase in RSV and influenza rates and associated hospitalizations, children ages 24 and under may not visit patients in [facility] hospitals. If the patient needs to stay at the hospital during part of their recovery, the visitor guidelines for inpatient rooms apply.  PRE-OP VISITATION  Pre-op nurse will coordinate an appropriate time for 1 support person to accompany the patient in pre-op.  This support person may not rotate.  This visitor will be contacted when the time is appropriate for the visitor to come back in the pre-op area.  Please refer to the [facility] website for the visitor guidelines for Inpatients (after your surgery is over and you are in a regular room).  You are not required to quarantine at this time prior to your surgery. However, you must do this: Hand Hygiene often Do NOT share personal items Notify your provider if you are in close contact with someone who has COVID or you develop fever 100.4 or greater, new onset of sneezing, cough, sore throat, shortness of breath or body aches.  If you test positive for Covid or have been in contact with anyone that has tested positive in the last 10 days please notify you surgeon.    Your procedure is scheduled on: 12/22/23   Report to [facility] Main Entrance: [place] [place] entrance where the [employer] is available.   Report to admitting at:5:15 AM  Call this number if you have any questions or problems the morning of surgery [phone]  FOLLOW ANY ADDITIONAL PRE OP INSTRUCTIONS YOU RECEIVED FROM YOUR SURGEON'S OFFICE!!!  Do not eat food or drink fluids after Midnight the night prior to your surgery/procedure.   Oral Hygiene is also important to reduce your risk of infection.        Remember - BRUSH YOUR TEETH  THE MORNING OF SURGERY WITH YOUR REGULAR TOOTHPASTE  Do NOT smoke after Midnight the night before surgery.  STOP TAKING all Vitamins, Herbs and supplements 1 week before your surgery.   Take ONLY these medicines the morning of surgery with A SIP OF WATER: fexofenadine,metoprolol ,finasteride .               You may not have any metal on your body including hair pins, jewelry, and body piercing  Do not wear lotions, powders, perfumes / cologne, or deodorant  Men may shave face and neck.  Contacts, Hearing Aids, dentures or bridgework may not be worn into surgery. DENTURES WILL BE REMOVED PRIOR TO SURGERY PLEASE DO NOT APPLY Poly grip OR ADHESIVES!!!  You may bring a small overnight bag with you on the day of surgery, only pack items that are not valuable. [facility] IS NOT RESPONSIBLE   FOR VALUABLES THAT ARE LOST OR STOLEN.   Patients discharged on the day of surgery will not be allowed to drive home.  Someone NEEDS to stay with you for the first 24 hours after anesthesia.  Do not bring your home medications to the hospital. The Pharmacy will dispense medications listed on your medication list to you during your admission in the Hospital.  Special Instructions: Bring a copy of your healthcare power of attorney and living will documents the day of surgery, if you wish to have them scanned into your [facility] Records- EPIC  Please read  over the following fact sheets you were given: IF YOU HAVE QUESTIONS ABOUT YOUR PRE-OP INSTRUCTIONS, PLEASE CALL [phone]   [facility] - Preparing for Surgery      Before surgery, you can play an important role.  Because skin is not sterile, your skin needs to be as free of germs as possible.  You can reduce the number of germs on your skin by washing with CHG (chlorahexidine gluconate) soap before surgery.  CHG is an antiseptic cleaner which kills germs and bonds with the skin to continue killing germs even after washing. Please DO NOT use if  you have an allergy to CHG or antibacterial soaps.  If your skin becomes reddened/irritated stop using the CHG and inform your nurse when you arrive at Short Stay. Do not shave (including legs and underarms) for at least 48 hours prior to the first CHG shower.  You may shave your face/neck.  Please follow these instructions carefully:  1.  Shower with CHG Soap the night before surgery ONLY (DO NOT USE THE SOAP THE MORNING OF SURGERY).  2.  If you choose to wash your hair, wash your hair first as usual with your normal  shampoo.  3.  After you shampoo, rinse your hair and body thoroughly to remove the shampoo.                             4.  Use CHG as you would any other liquid soap.  You can apply chg directly to the skin and wash.  Gently with a scrungie or clean washcloth.  5.  Apply the CHG Soap to your body ONLY FROM THE NECK DOWN.   Do not use on face/ open                           Wound or open sores. Avoid contact with eyes, ears mouth and genitals (private parts).                       Wash face,  Genitals (private parts) with your normal soap.             6.  Wash thoroughly, paying special attention to the area where your  surgery  will be performed.  7.  Thoroughly rinse your body with warm water from the neck down.  8.  DO NOT shower/wash with your normal soap after using and rinsing off the CHG Soap.                9.  Pat yourself dry with a clean towel.            10.  Wear clean pajamas.            11.  Place clean sheets on your bed the night of your first shower and do not  sleep with pets.  Day of Surgery : Do not apply any CHG, lotions/deodorants the morning of surgery.  Please wear clean clothes to the hospital/surgery center.   FAILURE TO FOLLOW THESE INSTRUCTIONS MAY RESULT IN THE CANCELLATION OF YOUR SURGERY  PATIENT SIGNATURE_________________________________  NURSE  SIGNATURE__________________________________  ________________________________________________________________________

## 2023-12-12 ENCOUNTER — Ambulatory Visit

## 2023-12-12 NOTE — Progress Notes (Signed)
 Anesthesia Chart Review   Case: [id number] Date/Time: 12/22/23 0715   Procedures:      CYSTOSCOPY, WITH BLADDER CALCULUS LITHOLAPAXY     TURP (TRANSURETHRAL RESECTION OF PROSTATE)   Anesthesia type: General   Diagnosis:      Urinary frequency [R35.0]     Prostate cancer (HCC) [C61]   Pre-op diagnosis: PROSTATE HYPERTROPHY, BLADDER STONE   Location: [facility] / [facility]   Surgeons: Alvaro Ricardo KATHEE Mickey., MD       DISCUSSION:73 y.o. former smoker with h/o hypothyroidism, CAD, prostate hypertrophy, bladder stone scheduled for above procedure 12/22/2023 with Dr. Ricardo Alvaro.   Calcium  score 09/29/2014 was 50 (48th percentile).  Echocardiogram 08/2022 showed EF 55 to 60%, G1 DD, normal RV function, no significant valvular disease.  Zio patch x 3 days 08/2022 showed 11 episodes of SVT with longest lasting 8 seconds with average heart rate 171 bpm.  Exercise Myoview  11/08/2023 showed fair exercise capacity (7.3 METS), no ECG evidence of ischemia, normal perfusion, LVEF 75%.   Pt seen by cardiology 12/21/2023. Per OV note pt doing well, denies chest pain, reports dyspnea has improved.  No changes made at this visit.  6 month follow up recommended.   Left ear skin cancer. Completed radiation therapy on 12/05/23.  VS: BP 137/89   Pulse 76   Temp 36.6 C (Oral)   Ht 6' 1 (1.854 m)   Wt 80.3 kg   SpO2 100%   BMI 23.35 kg/m   PROVIDERS: Patient, No Pcp Per  Kate Lonni CROME, MD is Cardiologist  LABS: Labs reviewed: Acceptable for surgery. (all labs ordered are listed, but only abnormal results are displayed)  Labs Reviewed  BASIC METABOLIC PANEL WITH GFR  CBC     IMAGES:   EKG:   CV: Echo 08/25/2022 1. Left ventricular ejection fraction, by estimation, is 55 to 60%. The  left ventricle has normal function. The left ventricle has no regional  wall motion abnormalities. Left ventricular diastolic parameters are  consistent with Grade I diastolic  dysfunction  (impaired relaxation).   2. Right ventricular systolic function is normal. The right ventricular  size is normal. There is normal pulmonary artery systolic pressure. The  estimated right ventricular systolic pressure is 26.4 mmHg.   3. The mitral valve is normal in structure. Trivial mitral valve  regurgitation. No evidence of mitral stenosis.   4. The aortic valve is tricuspid. Aortic valve regurgitation is not  visualized. No aortic stenosis is present.   5. The inferior vena cava is normal in size with greater than 50%  respiratory variability, suggesting right atrial pressure of 3 mmHg.   Myocardial Perfusion 11/08/2023   The study is normal. The study is low risk.   A Bruce protocol stress test was performed. Exercise capacity was normal. Patient exercised for 6 min and 15 sec. Maximum HR of 171 bpm. MPHR 116.0%. Peak METS 7.3. The patient experienced no angina during the test. The test was stopped because the patient experienced fatigue. The patient reported no symptoms during the stress test. Normal blood pressure and normal heart rate response noted during stress. Heart rate recovery was normal.   Arrhythmias during recovery: occasional PVCs. The ECG was negative for ischemia.   LV perfusion is normal. There is no evidence of ischemia. There is no evidence of infarction.   Left ventricular function is normal. Nuclear stress EF: 75%. The left ventricular ejection fraction is hyperdynamic (>65%). End diastolic cavity size is normal. End systolic  cavity size is normal. No evidence of transient ischemic dilation (TID) noted.   CT images were obtained for attenuation correction and were examined for the presence of coronary calcium  when appropriate.   Coronary calcium  was present on the attenuation correction CT images. Severe coronary calcifications were present. Coronary calcifications were present in the left anterior descending artery, left circumflex artery and right coronary artery  distribution(s).   Prior study not available for comparison.   Past Medical History:  Diagnosis Date   Arthritis    Coronary artery disease    High cholesterol    Hypothyroidism    Prostate cancer [facility])     Past Surgical History:  Procedure Laterality Date   COLONOSCOPY     ESOPHAGOGASTRODUODENOSCOPY ENDOSCOPY     HERNIA REPAIR Right    inguinal   MOHS SURGERY     head,ear,cheek,nose   OTOPLASATY Left 09/13/2023   Procedure: OTOPLASTY;  Surgeon: Jesus Oliphant, MD;  Location: [facility] OR;  Service: ENT;  Laterality: Left;  Left wedge resection of pinna with primary closure.   PAROTIDECTOMY Left 09/13/2023   Procedure: EXCISION, PAROTID GLAND;  Surgeon: Jesus Oliphant, MD;  Location: [facility] OR;  Service: ENT;  Laterality: Left;   RADICAL NECK DISSECTION Left 09/13/2023   Procedure: DISSECTION, NECK, RADICAL;  Surgeon: Jesus Oliphant, MD;  Location: [facility] OR;  Service: ENT;  Laterality: Left;   TONSILLECTOMY      MEDICATIONS:  aspirin EC 81 MG tablet   cyanocobalamin  (VITAMIN B12) 1000 MCG tablet   ezetimibe  (ZETIA ) 10 MG tablet   fexofenadine (ALLEGRA) 180 MG tablet   finasteride  (PROSCAR ) 5 MG tablet   ipratropium (ATROVENT ) 0.03 % nasal spray   lidocaine -prilocaine (EMLA) cream   metoprolol  tartrate (LOPRESSOR ) 25 MG tablet   ondansetron  (ZOFRAN ) 8 MG tablet   prochlorperazine (COMPAZINE) 10 MG tablet   pseudoephedrine (SUDAFED) 30 MG tablet   rosuvastatin  (CRESTOR ) 20 MG tablet   Saw Palmetto  500 MG CAPS   silodosin (RAPAFLO) 8 MG CAPS capsule   tadalafil  (CIALIS ) 5 MG tablet   No current facility-administered medications for this encounter.     Harlene Hoots Ward, PA-C [facility] [phone]

## 2023-12-13 ENCOUNTER — Inpatient Hospital Stay

## 2023-12-13 ENCOUNTER — Ambulatory Visit

## 2023-12-13 DIAGNOSIS — C44209 Unspecified malignant neoplasm of skin of left ear and external auricular canal: Secondary | ICD-10-CM

## 2023-12-14 ENCOUNTER — Ambulatory Visit

## 2023-12-14 ENCOUNTER — Inpatient Hospital Stay: Admitting: Nutrition

## 2023-12-14 NOTE — Progress Notes (Signed)
 Nutrition follow-up completed with patient and wife.  Patient with secondary malignant neoplasm of cervical lymph node. S/p radical neck dissection. Patient completed radiation to left parotid 10/21 under the care of Dr. Izell. Planning for Libtayo infusions q21d starting 11/19. Patient under the care of Dr. Autumn.   Reports he will have prostate surgery between now and the start of immunotherapy. He has an appetite but cannot eat a lot at one time. Continues to have increased fatigue. Taste alterations are improving and vegetables taste good now. Uses Metaqil which improved metallic taste.  Weight improved to 180.8 pounds today from 177 pounds October 27.  Nutrition diagnosis: Unintended weight loss, improved.  Intervention: Encouraged patient to continue adequate calories and protein throughout the day and small meals and snacks. Continue MetaQil or baking soda salt water to improve taste alterations. Provided encouragement.  Monitoring, evaluation, goals: Patient will tolerate adequate calories and protein to minimize weight loss.  Next visit: Wednesday, November 19 with Elvie  **Disclaimer: This note was dictated with voice recognition software. Similar sounding words can inadvertently be transcribed and this note may contain transcription errors which may not have been corrected upon publication of note.**

## 2023-12-18 NOTE — Progress Notes (Signed)
 Patient identity verified x2  Diagnosis: C77.0 Secondary and unspecified malignant neoplasm of lymph nodes of head, face and neck.   Mr. Charles Solis is here for a 2 week follow-up appointment today to see Leeroy Due PA-C. He completed radiation treatment on 12/05/23.  Pain issues, if any: Denies Using a feeding tube?: No Weight changes, if any: Unintended weight loss, improved.  12/18/23    180.2 lb 12/14/23 180.0 lb 12.8 oz 12/07/23  178.0 lb 1.6 oz  Swallowing issues, if any: Denies Smoking or chewing tobacco? Denies Using fluoride toothpaste daily? Yes Last ENT visit was on:  Other notable issues, if any: His mouth is dry. Biotene spray several times daily is working well. Patient taste is altered and sensitivity to smells continue.     BP (!) 117/94   Temp (!) 97 F (36.1 C)   Resp 20   Ht 6' 1 (1.854 m)   Wt 180 lb 3.2 oz (81.7 kg)   SpO2 100%   BMI 23.77 kg/m

## 2023-12-19 ENCOUNTER — Ambulatory Visit
Admission: RE | Admit: 2023-12-19 | Discharge: 2023-12-19 | Disposition: A | Discharge status: Home / Self Care | Source: Ambulatory Visit | Attending: Radiology | Admitting: Radiology

## 2023-12-19 ENCOUNTER — Encounter: Payer: Self-pay | Admitting: Radiology

## 2023-12-19 VITALS — BP 117/94 | Temp 97.0°F | Resp 20 | Ht 73.0 in | Wt 180.2 lb

## 2023-12-19 DIAGNOSIS — Z7982 Long term (current) use of aspirin: Secondary | ICD-10-CM | POA: Diagnosis not present

## 2023-12-19 DIAGNOSIS — C770 Secondary and unspecified malignant neoplasm of lymph nodes of head, face and neck: Secondary | ICD-10-CM | POA: Diagnosis not present

## 2023-12-19 DIAGNOSIS — Z79899 Other long term (current) drug therapy: Secondary | ICD-10-CM | POA: Insufficient documentation

## 2023-12-19 DIAGNOSIS — Z87891 Personal history of nicotine dependence: Secondary | ICD-10-CM | POA: Insufficient documentation

## 2023-12-19 DIAGNOSIS — C4490 Unspecified malignant neoplasm of skin, unspecified: Secondary | ICD-10-CM | POA: Diagnosis not present

## 2023-12-19 DIAGNOSIS — Z923 Personal history of irradiation: Secondary | ICD-10-CM | POA: Diagnosis not present

## 2023-12-19 DIAGNOSIS — L598 Other specified disorders of the skin and subcutaneous tissue related to radiation: Secondary | ICD-10-CM | POA: Insufficient documentation

## 2023-12-19 NOTE — Progress Notes (Signed)
 Radiation Oncology         [phone] ________________________________  Name: Charles Solis MRN: [id number]  Date: 12/19/2023  DOB: Sep 05, 1950  Follow-Up Visit Note  CC: Patient, No Pcp Per  No ref. provider found  Diagnosis and Prior Radiotherapy:       ICD-10-CM   1. 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.0 CT Soft Tissue Neck W Contrast    CT Chest W Contrast    2. Skin cancer  C44.90        ==========DELIVERED PLANS==========  First Treatment Date: 2023-10-25 Last Treatment Date: 2023-12-05   Plan Name: [id number] Site: Parotid, Left Technique: IMRT Mode: Photon Dose Per Fraction: 2 Gy Prescribed Dose (Delivered / Prescribed): 60 Gy / 60 Gy Prescribed Fxs (Delivered / Prescribed): 30 / 30   Cancer Staging  Skin cancer Staging form: Cutaneous Carcinoma of the Head and Neck, AJCC 8th Edition - Pathologic stage from 10/04/2023: Stage IV (rpTX, rpN3b, rcM0) - Signed by Izell Domino, MD on 10/04/2023 Stage prefix: Recurrence Extraosseous extension: Unknown  Stage IV (rpTx, rpN3b, 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   Narrative:  The patient returns today for routine follow-up.   Pain issues, if any: Denies Using a feeding tube?: No Weight changes, if any: Unintended weight loss, improved.  12/18/23    180.2 lb 12/14/23 180.0 lb 12.8 oz 12/07/23  178.0 lb 1.6 oz Swallowing issues, if any: Denies Smoking or chewing tobacco? Denies Using fluoride toothpaste daily? Yes Other notable issues, if any: His mouth is dry. Biotene spray several times daily is working well. Patient taste is altered and sensitivity to smells continue.                     ALLERGIES:  has no known allergies.  Meds: Current  Outpatient Medications  Medication Sig Dispense Refill   aspirin EC 81 MG tablet Take 81 mg by mouth daily. Swallow whole.     cyanocobalamin  (VITAMIN B12) 1000 MCG tablet Take 1,000 mcg by mouth daily.     ezetimibe  (ZETIA ) 10 MG tablet TAKE ONE TABLET BY MOUTH EVERY DAY 90 tablet 3   fexofenadine (ALLEGRA) 180 MG tablet Take 180 mg by mouth daily.     finasteride  (PROSCAR ) 5 MG tablet Take 5 mg by mouth daily.     ipratropium (ATROVENT ) 0.03 % nasal spray Place 1 spray into both nostrils daily.     lidocaine -prilocaine (EMLA) cream Apply to affected area once (Patient taking differently: Apply 1 Application topically daily. Apply to affected area once) 30 g 3   metoprolol  tartrate (LOPRESSOR ) 25 MG tablet Take 1 tablet (25 mg total) by mouth 2 (two) times daily. 180 tablet 1   ondansetron  (ZOFRAN ) 8 MG tablet Take 1 tablet (8 mg total) by mouth every 8 (eight) hours as needed for nausea or vomiting. 30 tablet 1   prochlorperazine (COMPAZINE) 10 MG tablet Take 1 tablet (10 mg total) by mouth every 6 (six) hours as needed for nausea or vomiting. 30 tablet 1   pseudoephedrine (SUDAFED) 30 MG tablet Take 30 mg by mouth every 4 (four) hours as needed for congestion.     rosuvastatin  (CRESTOR ) 20 MG tablet Take 1 tablet (20 mg total) by mouth daily. 90 tablet 3  Saw Palmetto  500 MG CAPS Take 1 capsule 3 (three) times daily by mouth.     silodosin (RAPAFLO) 8 MG CAPS capsule Take 8 mg by mouth daily with breakfast.     tadalafil  (CIALIS ) 5 MG tablet Take 1 tablet by mouth daily.     No current facility-administered medications for this encounter.    Physical Findings: The patient is in no acute distress. Patient is alert and oriented. Wt Readings from Last 3 Encounters:  12/19/23 180 lb 3.2 oz (81.7 kg)  12/14/23 180 lb 12.8 oz (82 kg)  12/11/23 177 lb (80.3 kg)    height is 6' 1 (1.854 m) and weight is 180 lb 3.2 oz (81.7 kg). His temperature is 97 F (36.1 C) (abnormal). His blood  pressure is 117/94 (abnormal). His respiration is 20 and oxygen  saturation is 100%. .  General: Alert and oriented, in no acute distress HEENT: Head is normocephalic. Extraocular movements are intact. Oropharynx is notable for no concerning lesions within the oral cavity. Good dentition.  Neck: Neck is notable for no palpable adenopathy. Well-healed surgical incision along the left cervical region. Left upper trapezius muscle tightness.  Skin: Skin in treatment fields shows open sub-centimeter skin lesion at the back of the left earlobe.  Heart: Regular in rate and rhythm with no murmurs, rubs, or gallops. Chest: Clear to auscultation bilaterally, with no rhonchi, wheezes, or rales. Abdomen: Soft, nontender, nondistended, with no rigidity or guarding. Extremities: No cyanosis or edema. Lymphatics: see Neck Exam Psychiatric: Judgment and insight are intact. Affect is appropriate.   Lab Findings: Lab Results  Component Value Date   WBC 4.2 12/11/2023   HGB 13.7 12/11/2023   HCT 41.7 12/11/2023   MCV 89.1 12/11/2023   PLT 180 12/11/2023    Lab Results  Component Value Date   TSH 0.895 12/07/2023    Radiographic Findings: No results found.  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   2) Nutritional Status: Stable.  Wt Readings from Last 3 Encounters:  12/19/23 180 lb 3.2 oz (81.7 kg)  12/14/23 180 lb 12.8 oz (82 kg)  12/11/23 177 lb (80.3 kg)  PEG tube: N/A  3) Risk Factors: The patient has been educated about risk factors including alcohol and tobacco abuse; they understand that avoidance of alcohol and tobacco is important to prevent recurrences as well as other cancers. Patient has not smoked for over 40 years.   4) Swallowing:  Functional. He will see Lupita SLP on 01/31/2024.   5) Dental: Encouraged to continue regular followup with dentistry, and dental hygiene including fluoride rinses. Patient states he will see his dentist regularly every 6 months.   6) Thyroid  function: Will check annually.  Lab Results  Component Value Date   TSH 0.895 12/07/2023    7) Other: Left trapezius muscle tightness. This could be secondary to his surgery or radiation. Patient is scheduled to see PT on 01/02/2024 and will bring this to [facility]'s attention.   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 Encouraged use of neosporin over area of open skin and continued sonafine to the areas of dry desquamation and erythema.   8) CT of the neck and chest in 2.5 months with an office visit to follow. The patient was encouraged to call with  any issues or questions before then.  On date of service, in total, I spent 20 minutes on this encounter. Patient was seen in person. _____________________________________    Leeroy Due, PA-C

## 2023-12-19 NOTE — Progress Notes (Unsigned)
 Cardiology Office Note:    Date:  12/21/2023   ID:  Charles Solis, DOB 1950-04-27, MRN [id number]  PCP:  Charles Solis, No Pcp Per  Cardiologist:  Aleene Passe, MD (Inactive)  Electrophysiologist:  None   Referring MD: Judyth Isaiah Bottcher, *   Chief Complaint  Charles Solis presents with   Coronary Artery Disease    History of Present Illness:    Charles Solis is a 73 y.o. male with a hx of hyperlipidemia, prostate cancer who presents for follow-up.  Previously followed with Dr. Passe.  Calcium  score 09/29/2014 was 50 (48th percentile).  Echocardiogram 08/2022 showed EF 55 to 60%, G1 DD, normal RV function, no significant valvular disease.  Zio patch x 3 days 08/2022 showed 11 episodes of SVT with longest lasting 8 seconds with average heart rate 171 bpm.  Exercise Myoview  11/08/2023 showed fair exercise capacity (7.3 METS), no ECG evidence of ischemia, normal perfusion, LVEF 75%.  Since last clinic visit, he reports he is doing well.  Reports dyspnea has improved.  Reports has had some fatigue from his radiation but otherwise denies any issues.  He denies any chest pain.  Reports occasional lightheadedness.  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   PAROTIDECTOMY Left 09/13/2023   Procedure: EXCISION, PAROTID GLAND;  Surgeon: Jesus Oliphant, MD;  Location: [facility] OR;  Service: ENT;  Laterality: Left;   RADICAL NECK DISSECTION Left 09/13/2023   Procedure: DISSECTION,  NECK, RADICAL;  Surgeon: Jesus Oliphant, MD;  Location: [facility] OR;  Service: ENT;  Laterality: Left;   TONSILLECTOMY      Current Medications: Current Meds  Medication Sig   aspirin EC 81 MG tablet Take 81 mg by mouth daily. Swallow whole.   cyanocobalamin  (VITAMIN B12) 1000 MCG tablet Take 1,000 mcg by mouth daily.   ezetimibe  (ZETIA ) 10 MG tablet TAKE ONE TABLET BY MOUTH EVERY DAY   fexofenadine (ALLEGRA) 180 MG tablet Take 180 mg by mouth daily.   finasteride  (PROSCAR ) 5 MG tablet Take 5 mg by mouth daily.   ipratropium (ATROVENT ) 0.03 % nasal spray Place 1 spray into both nostrils daily.   lidocaine -prilocaine (EMLA) cream Apply to affected area once   metoprolol  tartrate (LOPRESSOR ) 25 MG tablet Take 1 tablet (25 mg total) by mouth 2 (two) times daily.   ondansetron  (ZOFRAN ) 8 MG tablet Take 1 tablet (8 mg total) by mouth every 8 (eight) hours as needed for nausea or vomiting.   prochlorperazine (COMPAZINE) 10 MG tablet Take 1 tablet (10 mg total) by mouth every 6 (six) hours as needed for nausea or vomiting.   pseudoephedrine (SUDAFED) 30 MG tablet Take 30 mg by mouth every 4 (four) hours as needed for congestion.   rosuvastatin  (CRESTOR ) 20 MG tablet Take 1 tablet (20 mg total) by mouth daily.   Saw Palmetto  500 MG CAPS Take 1 capsule 3 (three) times  daily by mouth.   silodosin (RAPAFLO) 8 MG CAPS capsule Take 8 mg by mouth daily with breakfast.   tadalafil  (CIALIS ) 5 MG tablet Take 1 tablet by mouth daily.     Allergies:   Charles Solis has no known allergies.   Social History   Socioeconomic History   Marital status: Divorced    Spouse name: Not on file   Number of children: 0   Years of education: Not on file   Highest education level: Not on file  Occupational History   Not on file  Tobacco Use   Smoking status: Former    Types: Cigarettes    Start date: 63    Quit date: 24    Years since quitting: 55.8    Passive exposure: Never   Smokeless tobacco: Never  Vaping Use    Vaping status: Never Used  Substance and Sexual Activity   Alcohol use: Not Currently    Alcohol/week: 1.0 standard drink of alcohol    Types: 1 Cans of beer per week   Drug use: No   Sexual activity: Not on file  Other Topics Concern   Not on file  Social History Narrative   Not on file   Social Drivers of Health   Financial Resource Strain: Not on file  Food Insecurity: No Food Insecurity (12/21/2023)   Hunger Vital Sign    Worried About Running Out of Food in the Last Year: Never true    Ran Out of Food in the Last Year: Never true  Transportation Needs: No Transportation Needs (10/13/2023)   PRAPARE - Administrator, Civil Service (Medical): No    Lack of Transportation (Non-Medical): No  Physical Activity: Not on file  Stress: Not on file  Social Connections: Moderately Integrated (09/13/2023)   Social Connection and Isolation Panel    Frequency of Communication with Friends and Family: Twice a week    Frequency of Social Gatherings with Friends and Family: Twice a week    Attends Religious Services: More than 4 times per year    Active Member of [employer] or Organizations: No    Attends Engineer, Structural: Never    Marital Status: Living with partner     Family History: The Charles Solis's family history includes Alzheimer's disease in his mother and paternal grandmother; Cancer in his father; Colon cancer in his father; Coronary artery disease in his maternal grandfather; Heart attack in his father and maternal grandfather; Heart disease in his maternal grandfather and paternal grandfather; Hyperlipidemia in his sister; Hypertension in his mother; Lung cancer in his paternal grandfather.  ROS:   Please see the history of present illness.     All other systems reviewed and are negative.  EKGs/Labs/Other Studies Reviewed:    The following studies were reviewed today:   EKG:  EKG is not ordered today.   Recent Labs: 12/07/2023: TSH 0.895 12/11/2023:  BUN 12; Creatinine, Ser 0.83; Hemoglobin 13.7; Platelets 180; Potassium 4.2; Sodium 137  Recent Lipid Panel    Component Value Date/Time   CHOL 115 12/20/2022 1439   CHOL 170 07/21/2015 0916   TRIG 79 12/20/2022 1439   TRIG 61 12/21/2016 0955   HDL 44 12/20/2022 1439   HDL 51 12/21/2016 0955   CHOLHDL 2.6 12/20/2022 1439   CHOLHDL 3.0 07/21/2015 0916   LDLCALC 55 12/20/2022 1439   LDLCALC 95 07/21/2015 0916    Physical Exam:    VS:  BP 102/66 (BP Location: Left Arm, Charles Solis Position:  Sitting, Cuff Size: Normal)   Pulse 68   Ht 6' 1 (1.854 m)   Wt 179 lb (81.2 kg)   SpO2 99%   BMI 23.62 kg/m     Wt Readings from Last 3 Encounters:  12/21/23 179 lb (81.2 kg)  12/19/23 180 lb 3.2 oz (81.7 kg)  12/14/23 180 lb 12.8 oz (82 kg)     GEN:  Well nourished, well developed in no acute distress HEENT: Normal NECK: No JVD; No carotid bruits LYMPHATICS: No lymphadenopathy CARDIAC: RRR, no murmurs, rubs, gallops RESPIRATORY:  Clear to auscultation without rales, wheezing or rhonchi  ABDOMEN: Soft, non-tender, non-distended MUSCULOSKELETAL:  No edema; No deformity  SKIN: Warm and dry NEUROLOGIC:  Alert and oriented x 3 PSYCHIATRIC:  Normal affect   ASSESSMENT:    1. Coronary artery disease involving native coronary artery of native heart, unspecified whether angina present   2. SVT (supraventricular tachycardia)   3. Hyperlipidemia LDL goal <70   4. Lightheadedness     PLAN:    CAD: Reports having dyspnea with exertion, could represent anginal equivalent.  CT chest showed severe coronary calcifications.  Echocardiogram 08/2022 showed EF 55 to 60%, G1DD, normal RV function, no significant valvular disease.  Exercise Myoview  11/08/2023 showed fair exercise capacity (7.3 METS), no ECG evidence of ischemia, normal perfusion, LVEF 75%. - Continue aspirin 81 mg daily - Continue rosuvastatin  and Zetia   SVT: Zio patch x 3 days 08/2022 showed 11 episodes of SVT with longest lasting 8  seconds with average heart rate 171 bpm.  Echocardiogram 09/2023 showed EF 55 to 60%, G1 DD, normal RV function, no significant valvular disease. - Continue metoprolol  25 mg twice daily.  Denies any recent palpitations  Hyperlipidemia: On rosuvastatin  20 mg daily and Zetia  10 mg daily.  Calcium  score 09/29/2014 was 50 (48th percentile).  LDL 55 on 12/20/22 .  Update lipid panel  Lightheadedness: Carotid atherosclerosis noted on CT scan.  Carotid duplex shows near normal carotid arteries with only minimal wall thickening   RTC in 6 months     Medication Adjustments/Labs and Tests Ordered: Current medicines are reviewed at length with the Charles Solis today.  Concerns regarding medicines are outlined above.  Orders Placed This Encounter  Procedures   Lipid panel   No orders of the defined types were placed in this encounter.   Charles Solis Instructions  Medication Instructions:  No changes *If you need a refill on your cardiac medications before your next appointment, please call your pharmacy*  Lab Work: Lipid panel - take lab slip with you when you go for labs at [facility]  If you have labs (blood work) drawn today and your tests are completely normal, you will receive your results only by: MyChart Message (if you have MyChart) OR A paper copy in the mail If you have any lab test that is abnormal or we need to change your treatment, we will call you to review the results.  Testing/Procedures: none  Follow-Up: At [facility], you and your health needs are our priority.  As part of our continuing mission to provide you with exceptional heart care, our providers are all part of one team.  This team includes your primary Cardiologist (physician) and Advanced Practice Providers or APPs (Physician Assistants and Nurse Practitioners) who all work together to provide you with the care you need, when you need it.  Your next appointment:   12 month(s)  Provider:   Lonni Nanas, MD  Signed, Lonni LITTIE Nanas, MD  12/21/2023 10:28 AM    [facility]

## 2023-12-20 ENCOUNTER — Other Ambulatory Visit: Payer: Self-pay

## 2023-12-21 ENCOUNTER — Encounter (HOSPITAL_BASED_OUTPATIENT_CLINIC_OR_DEPARTMENT_OTHER): Payer: Self-pay | Admitting: Cardiology

## 2023-12-21 ENCOUNTER — Ambulatory Visit (INDEPENDENT_AMBULATORY_CARE_PROVIDER_SITE_OTHER): Admitting: Cardiology

## 2023-12-21 ENCOUNTER — Other Ambulatory Visit (HOSPITAL_COMMUNITY)

## 2023-12-21 ENCOUNTER — Telehealth: Payer: Self-pay | Admitting: *Deleted

## 2023-12-21 VITALS — BP 102/66 | HR 68 | Ht 73.0 in | Wt 179.0 lb

## 2023-12-21 DIAGNOSIS — E785 Hyperlipidemia, unspecified: Secondary | ICD-10-CM | POA: Diagnosis not present

## 2023-12-21 DIAGNOSIS — I2510 Atherosclerotic heart disease of native coronary artery without angina pectoris: Secondary | ICD-10-CM

## 2023-12-21 DIAGNOSIS — R42 Dizziness and giddiness: Secondary | ICD-10-CM

## 2023-12-21 DIAGNOSIS — I471 Supraventricular tachycardia, unspecified: Secondary | ICD-10-CM | POA: Diagnosis not present

## 2023-12-21 MED ORDER — GENTAMICIN SULFATE 40 MG/ML IJ SOLN
5.0000 mg/kg | INTRAVENOUS | Status: AC
  Administered 2023-12-22: 410 mg via INTRAVENOUS
  Filled 2023-12-21: qty 10.25

## 2023-12-21 NOTE — Patient Instructions (Signed)
 Medication Instructions:  No changes *If you need a refill on your cardiac medications before your next appointment, please call your pharmacy*  Lab Work: Lipid panel - take lab slip with you when you go for labs at [facility]  If you have labs (blood work) drawn today and your tests are completely normal, you will receive your results only by: MyChart Message (if you have MyChart) OR A paper copy in the mail If you have any lab test that is abnormal or we need to change your treatment, we will call you to review the results.  Testing/Procedures: none  Follow-Up: At [facility], you and your health needs are our priority.  As part of our continuing mission to provide you with exceptional heart care, our providers are all part of one team.  This team includes your primary Cardiologist (physician) and Advanced Practice Providers or APPs (Physician Assistants and Nurse Practitioners) who all work together to provide you with the care you need, when you need it.  Your next appointment:   12 month(s)  Provider:   Lonni Nanas, MD

## 2023-12-21 NOTE — Telephone Encounter (Signed)
 Called patient to inform of Ct for 03-11-24- arrival time- 1 pm @ [facility],no restrictions to scan, patient to receive results from PA Ronita Due on 03-19-24 @ 10 am, spoke with patient and he is aware of these appts. and the instructions

## 2023-12-22 ENCOUNTER — Encounter (HOSPITAL_COMMUNITY)
Admission: RE | Disposition: A | Discharge status: Home / Self Care | Payer: Self-pay | Source: Home / Self Care | Attending: Urology

## 2023-12-22 ENCOUNTER — Ambulatory Visit (HOSPITAL_COMMUNITY): Payer: Self-pay | Admitting: Physician Assistant

## 2023-12-22 ENCOUNTER — Other Ambulatory Visit: Payer: Self-pay | Admitting: Radiology

## 2023-12-22 ENCOUNTER — Observation Stay (HOSPITAL_COMMUNITY)
Admission: RE | Admit: 2023-12-22 | Discharge: 2023-12-23 | Disposition: A | Discharge status: Home / Self Care | Attending: Urology | Admitting: Urology

## 2023-12-22 ENCOUNTER — Other Ambulatory Visit: Payer: Self-pay

## 2023-12-22 ENCOUNTER — Ambulatory Visit (HOSPITAL_COMMUNITY): Admitting: Certified Registered Nurse Anesthetist

## 2023-12-22 ENCOUNTER — Encounter (HOSPITAL_COMMUNITY): Payer: Self-pay | Admitting: Urology

## 2023-12-22 DIAGNOSIS — N210 Calculus in bladder: Secondary | ICD-10-CM

## 2023-12-22 DIAGNOSIS — I10 Essential (primary) hypertension: Secondary | ICD-10-CM | POA: Diagnosis not present

## 2023-12-22 DIAGNOSIS — E039 Hypothyroidism, unspecified: Secondary | ICD-10-CM | POA: Diagnosis not present

## 2023-12-22 DIAGNOSIS — Z7982 Long term (current) use of aspirin: Secondary | ICD-10-CM | POA: Insufficient documentation

## 2023-12-22 DIAGNOSIS — C61 Malignant neoplasm of prostate: Secondary | ICD-10-CM | POA: Diagnosis not present

## 2023-12-22 DIAGNOSIS — N400 Enlarged prostate without lower urinary tract symptoms: Principal | ICD-10-CM | POA: Diagnosis present

## 2023-12-22 DIAGNOSIS — R350 Frequency of micturition: Secondary | ICD-10-CM | POA: Diagnosis not present

## 2023-12-22 DIAGNOSIS — I2510 Atherosclerotic heart disease of native coronary artery without angina pectoris: Secondary | ICD-10-CM | POA: Diagnosis not present

## 2023-12-22 DIAGNOSIS — Z87891 Personal history of nicotine dependence: Secondary | ICD-10-CM | POA: Insufficient documentation

## 2023-12-22 DIAGNOSIS — N401 Enlarged prostate with lower urinary tract symptoms: Secondary | ICD-10-CM | POA: Diagnosis not present

## 2023-12-22 DIAGNOSIS — R338 Other retention of urine: Secondary | ICD-10-CM | POA: Diagnosis not present

## 2023-12-22 HISTORY — PX: CYSTOSCOPY WITH LITHOLAPAXY: SHX1425

## 2023-12-22 HISTORY — PX: TRANSURETHRAL RESECTION OF PROSTATE: SHX73

## 2023-12-22 SURGERY — CYSTOSCOPY, WITH BLADDER CALCULUS LITHOLAPAXY
Anesthesia: General | Site: Prostate

## 2023-12-22 MED ORDER — MIDAZOLAM HCL (PF) 2 MG/2ML IJ SOLN
INTRAMUSCULAR | Status: DC | PRN
  Administered 2023-12-22: 2 mg via INTRAVENOUS

## 2023-12-22 MED ORDER — ACETAMINOPHEN 500 MG PO TABS: 1000.0000 mg | ORAL_TABLET | Freq: Once | ORAL | Status: DC

## 2023-12-22 MED ORDER — DEXAMETHASONE SOD PHOSPHATE PF 10 MG/ML IJ SOLN
INTRAMUSCULAR | Status: DC | PRN
  Administered 2023-12-22: 10 mg via INTRAVENOUS

## 2023-12-22 MED ORDER — ACETAMINOPHEN 500 MG PO TABS
1000.0000 mg | ORAL_TABLET | Freq: Three times a day (TID) | ORAL | Status: AC
  Administered 2023-12-22 – 2023-12-23 (×3): 1000 mg via ORAL
  Filled 2023-12-22 (×3): qty 2

## 2023-12-22 MED ORDER — SENNOSIDES-DOCUSATE SODIUM 8.6-50 MG PO TABS
1.0000 | ORAL_TABLET | Freq: Two times a day (BID) | ORAL | Status: DC
  Administered 2023-12-22 – 2023-12-23 (×2): 1 via ORAL
  Filled 2023-12-22 (×2): qty 1

## 2023-12-22 MED ORDER — OXYCODONE-ACETAMINOPHEN 5-325 MG PO TABS: 1.0000 | ORAL_TABLET | Freq: Four times a day (QID) | ORAL | 0 refills | Status: DC | PRN

## 2023-12-22 MED ORDER — STERILE WATER FOR IRRIGATION IR SOLN
Status: DC | PRN
Start: 2023-12-22 — End: 2023-12-22
  Administered 2023-12-22: 500 mL

## 2023-12-22 MED ORDER — ORAL CARE MOUTH RINSE
15.0000 mL | Freq: Once | OROMUCOSAL | Status: AC
Start: 2023-12-22 — End: 2023-12-22

## 2023-12-22 MED ORDER — OXYCODONE HCL 5 MG PO TABS: 5.0000 mg | ORAL_TABLET | ORAL | Status: DC | PRN

## 2023-12-22 MED ORDER — LACTATED RINGERS IV SOLN: INTRAVENOUS | Status: DC

## 2023-12-22 MED ORDER — ROCURONIUM BROMIDE 10 MG/ML (PF) SYRINGE
PREFILLED_SYRINGE | INTRAVENOUS | Status: AC
  Filled 2023-12-22: qty 10

## 2023-12-22 MED ORDER — AMISULPRIDE (ANTIEMETIC) 5 MG/2ML IV SOLN: 10.0000 mg | Freq: Once | INTRAVENOUS | Status: DC | PRN

## 2023-12-22 MED ORDER — FINASTERIDE 5 MG PO TABS
5.0000 mg | ORAL_TABLET | Freq: Every day | ORAL | Status: DC
  Administered 2023-12-22: 5 mg via ORAL
  Filled 2023-12-22: qty 1

## 2023-12-22 MED ORDER — HYDROMORPHONE HCL 1 MG/ML IJ SOLN
0.2500 mg | INTRAMUSCULAR | Status: DC | PRN
  Administered 2023-12-22: 0.5 mg via INTRAVENOUS

## 2023-12-22 MED ORDER — SODIUM CHLORIDE 0.9 % IR SOLN
Status: DC | PRN
  Administered 2023-12-22: 3000 mL via INTRAVESICAL

## 2023-12-22 MED ORDER — HYDROMORPHONE HCL 1 MG/ML IJ SOLN
INTRAMUSCULAR | Status: AC
  Filled 2023-12-22: qty 1

## 2023-12-22 MED ORDER — EZETIMIBE 10 MG PO TABS
10.0000 mg | ORAL_TABLET | Freq: Every day | ORAL | Status: DC
  Administered 2023-12-22: 10 mg via ORAL
  Filled 2023-12-22: qty 1

## 2023-12-22 MED ORDER — HYDROMORPHONE HCL 1 MG/ML IJ SOLN: 0.5000 mg | INTRAMUSCULAR | Status: DC | PRN

## 2023-12-22 MED ORDER — SODIUM CHLORIDE 0.9 % IR SOLN
3000.0000 mL | Status: DC
  Administered 2023-12-22: 3000 mL

## 2023-12-22 MED ORDER — MIDAZOLAM HCL 2 MG/2ML IJ SOLN
INTRAMUSCULAR | Status: AC
  Filled 2023-12-22: qty 2

## 2023-12-22 MED ORDER — PROPOFOL 500 MG/50ML IV EMUL
INTRAVENOUS | Status: DC | PRN
  Administered 2023-12-22: 150 mg via INTRAVENOUS
  Administered 2023-12-22: 50 mg via INTRAVENOUS

## 2023-12-22 MED ORDER — OXYCODONE HCL 5 MG/5ML PO SOLN: 5.0000 mg | Freq: Once | ORAL | Status: DC | PRN

## 2023-12-22 MED ORDER — FENTANYL CITRATE (PF) 100 MCG/2ML IJ SOLN
INTRAMUSCULAR | Status: AC
  Filled 2023-12-22: qty 2

## 2023-12-22 MED ORDER — PROPOFOL 10 MG/ML IV BOLUS
INTRAVENOUS | Status: AC
  Filled 2023-12-22: qty 20

## 2023-12-22 MED ORDER — IPRATROPIUM BROMIDE 0.03 % NA SOLN
1.0000 | Freq: Every day | NASAL | Status: DC
  Filled 2023-12-22: qty 30

## 2023-12-22 MED ORDER — OXYCODONE HCL 5 MG PO TABS: 5.0000 mg | ORAL_TABLET | Freq: Once | ORAL | Status: DC | PRN

## 2023-12-22 MED ORDER — PHENYLEPHRINE HCL (PRESSORS) 10 MG/ML IV SOLN
INTRAVENOUS | Status: DC | PRN
  Administered 2023-12-22 (×2): 160 ug via INTRAVENOUS
  Administered 2023-12-22: 80 ug via INTRAVENOUS

## 2023-12-22 MED ORDER — SENNOSIDES-DOCUSATE SODIUM 8.6-50 MG PO TABS: 1.0000 | ORAL_TABLET | Freq: Two times a day (BID) | ORAL | 0 refills | Status: DC

## 2023-12-22 MED ORDER — CHLORHEXIDINE GLUCONATE 0.12 % MT SOLN
15.0000 mL | Freq: Once | OROMUCOSAL | Status: AC
  Administered 2023-12-22: 15 mL via OROMUCOSAL

## 2023-12-22 MED ORDER — CEPHALEXIN 500 MG PO CAPS: 500.0000 mg | ORAL_CAPSULE | Freq: Two times a day (BID) | ORAL | 0 refills | Status: DC

## 2023-12-22 MED ORDER — SODIUM CHLORIDE 0.9 % IV SOLN: INTRAVENOUS | Status: DC

## 2023-12-22 MED ORDER — ACETAMINOPHEN 500 MG PO TABS
1000.0000 mg | ORAL_TABLET | Freq: Once | ORAL | Status: AC
  Administered 2023-12-22: 1000 mg via ORAL
  Filled 2023-12-22: qty 2

## 2023-12-22 MED ORDER — FENTANYL CITRATE (PF) 100 MCG/2ML IJ SOLN
INTRAMUSCULAR | Status: DC | PRN
  Administered 2023-12-22 (×2): 25 ug via INTRAVENOUS

## 2023-12-22 MED ORDER — ROSUVASTATIN CALCIUM 20 MG PO TABS
20.0000 mg | ORAL_TABLET | Freq: Every day | ORAL | Status: DC
  Administered 2023-12-22: 20 mg via ORAL
  Filled 2023-12-22: qty 1

## 2023-12-22 MED ORDER — ONDANSETRON HCL 4 MG/2ML IJ SOLN
4.0000 mg | Freq: Once | INTRAMUSCULAR | Status: AC | PRN
  Administered 2023-12-22: 4 mg via INTRAVENOUS

## 2023-12-22 MED ORDER — METOPROLOL TARTRATE 25 MG PO TABS
25.0000 mg | ORAL_TABLET | Freq: Two times a day (BID) | ORAL | Status: DC
  Administered 2023-12-22 – 2023-12-23 (×2): 25 mg via ORAL
  Filled 2023-12-22 (×2): qty 1

## 2023-12-22 MED ORDER — LIDOCAINE 2% (20 MG/ML) 5 ML SYRINGE
INTRAMUSCULAR | Status: DC | PRN
  Administered 2023-12-22: 60 mg via INTRAVENOUS

## 2023-12-22 SURGICAL SUPPLY — 23 items
BAG COUNTER SPONGE SURGICOUNT (BAG) IMPLANT
BAG URINE DRAIN 2000ML AR STRL (UROLOGICAL SUPPLIES) ×2 IMPLANT
BAG URO CATCHER STRL LF (MISCELLANEOUS) ×2 IMPLANT
CATH URTH STD 24FR FL 3W 2 (CATHETERS) IMPLANT
CLOTH BEACON ORANGE TIMEOUT ST (SAFETY) ×2 IMPLANT
DRAPE FOOT SWITCH (DRAPES) ×2 IMPLANT
GLOVE SURG LX STRL 7.5 STRW (GLOVE) ×2 IMPLANT
GOWN STRL REUS W/ TWL XL LVL3 (GOWN DISPOSABLE) ×2 IMPLANT
GUIDEWIRE STR DUAL SENSOR (WIRE) IMPLANT
HOLDER FOLEY CATH W/STRAP (MISCELLANEOUS) IMPLANT
IV CATH AUTO 14GX1.75 SAFE ORG (IV SOLUTION) IMPLANT
KIT TURNOVER KIT A (KITS) ×2 IMPLANT
LASER FIB FLEXIVA PULSE ID 550 (Laser) IMPLANT
LASER FIB FLEXIVA PULSE ID 910 (Laser) IMPLANT
LOOP CUT BIPOLAR 24F LRG (ELECTROSURGICAL) IMPLANT
MANIFOLD NEPTUNE II (INSTRUMENTS) ×2 IMPLANT
PACK CYSTO (CUSTOM PROCEDURE TRAY) ×2 IMPLANT
PAD PREP 24X48 CUFFED NSTRL (MISCELLANEOUS) ×2 IMPLANT
SYR 30ML LL (SYRINGE) ×2 IMPLANT
SYRINGE TOOMEY IRRIG 70ML (MISCELLANEOUS) ×2 IMPLANT
TUBING CONNECTING 10 (TUBING) ×2 IMPLANT
TUBING UROLOGY SET (TUBING) ×2 IMPLANT
WATER STERILE IRR 500ML POUR (IV SOLUTION) IMPLANT

## 2023-12-22 NOTE — Transfer of Care (Signed)
 Immediate Anesthesia Transfer of Care Note  Patient: Charles Solis  Procedure(s) Performed: Procedure(s): CYSTOSCOPY, WITH BLADDER CALCULUS LITHOLAPAXY (N/A) TURP (TRANSURETHRAL RESECTION OF PROSTATE) (N/A)  Patient Location: PACU  Anesthesia Type:General  Level of Consciousness: Alert, Awake, Oriented  Airway & Oxygen  Therapy: Patient Spontanous Breathing  Post-op Assessment: Report given to RN  Post vital signs: Reviewed and stable  Last Vitals:  Vitals:   12/22/23 0616 12/22/23 0825  BP: 128/89 135/82  Pulse: 72 85  Resp: 18 14  Temp: 36.4 C (!) 36.3 C  SpO2: 100% 100%    Complications: No apparent anesthesia complications

## 2023-12-22 NOTE — H&P (Signed)
 Charles Solis is an 73 y.o. male.    Chief Complaint: Pre-OP Transurethral Resection of Prostate / Cystolithalopexy  HPI:   1 - Very Low Risk Prostate Cancer - 1 core grade 1 cancer 2011 by [facility]. Has not had regular biopsies or MRI.  2023 - PSA 3.65, DRE 45gm smooth  07/2022 - PSA 1.6 (finasteride ) , MRI very small (<23mL) P4 lesion; 01/2023 PSA 1.66 (finasteride ), DRE stable  07/2023 - PSA 2.6, template BX 38 mL, small (calcifeid) median ==> 2 cores (LLA, LLM) up to 20% grade 1 cancer   2 - Lower Urinary Tract Symptoms - s/p prostate steam therapy by [facility] 2022. Remains on silodosin + dail tadalafil  + finasteride . Still bothored by mostly obstrutive symptoms. DRE 2023 45gm, PVR 54mL (normal). Prostate vol 35mL (NOT enlarged) by MRI 2024. Interested in TURP at some poitn for further improvement and to try to get off aslpha blocerks (even silodosin causes some dizziness)   PMH sig for Rt inguinal hernia repair. NO CV disease / blood thinners. 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 His PCP is Dr. Heloise.   Today  Signe  is seen to proceed with TURP + Cystolithalopexy for med-refracotry LUTS and stone. NO interval fevers. UCX negative, Hgb 13.7, Cr 0.8 most recntly.   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   PAROTIDECTOMY Left  09/13/2023   Procedure: EXCISION, PAROTID GLAND;  Surgeon: Jesus Oliphant, MD;  Location: [facility] OR;  Service: ENT;  Laterality: Left;   RADICAL NECK DISSECTION Left 09/13/2023   Procedure: DISSECTION, NECK, RADICAL;  Surgeon: Jesus Oliphant, MD;  Location: [facility] OR;  Service: ENT;  Laterality: Left;   TONSILLECTOMY      Family History  Problem Relation Age of Onset   Alzheimer's disease Mother    Hypertension Mother    Heart attack Father    Colon cancer Father    Cancer Father    Coronary artery disease Maternal Grandfather    Heart attack Maternal Grandfather    Heart disease Maternal Grandfather    Alzheimer's disease Paternal Grandmother    Lung cancer Paternal Grandfather    Heart disease Paternal Grandfather    Hyperlipidemia Sister    Social History:  reports that he quit smoking about 55 years ago. His smoking use included cigarettes. He started smoking about 44 years ago. He has never been exposed to tobacco smoke. He has never used smokeless tobacco. He reports that he does not currently use alcohol after a past usage of about 1.0 standard drink of alcohol per week. He reports that he does not use drugs.  Allergies: No Known Allergies  Medications Prior to Admission  Medication Sig Dispense Refill   aspirin EC 81 MG tablet Take 81  mg by mouth daily. Swallow whole.     cyanocobalamin  (VITAMIN B12) 1000 MCG tablet Take 1,000 mcg by mouth daily.     ezetimibe  (ZETIA ) 10 MG tablet TAKE ONE TABLET BY MOUTH EVERY DAY 90 tablet 3   fexofenadine (ALLEGRA) 180 MG tablet Take 180 mg by mouth daily.     finasteride  (PROSCAR ) 5 MG tablet Take 5 mg by mouth daily.     ipratropium (ATROVENT ) 0.03 % nasal spray Place 1 spray into both nostrils daily.     lidocaine -prilocaine (EMLA) cream Apply to affected area once 30 g 3   metoprolol  tartrate (LOPRESSOR ) 25 MG tablet Take 1 tablet (25 mg total) by mouth 2 (two) times daily. 180 tablet 1   pseudoephedrine (SUDAFED) 30 MG tablet Take 30 mg by  mouth every 4 (four) hours as needed for congestion.     rosuvastatin  (CRESTOR ) 20 MG tablet Take 1 tablet (20 mg total) by mouth daily. 90 tablet 3   Saw Palmetto  500 MG CAPS Take 1 capsule 3 (three) times daily by mouth.     silodosin (RAPAFLO) 8 MG CAPS capsule Take 8 mg by mouth daily with breakfast.     tadalafil  (CIALIS ) 5 MG tablet Take 1 tablet by mouth daily.     ondansetron  (ZOFRAN ) 8 MG tablet Take 1 tablet (8 mg total) by mouth every 8 (eight) hours as needed for nausea or vomiting. 30 tablet 1   prochlorperazine (COMPAZINE) 10 MG tablet Take 1 tablet (10 mg total) by mouth every 6 (six) hours as needed for nausea or vomiting. 30 tablet 1    No results found for this or any previous visit (from the past 48 hours). No results found.  Review of Systems  Constitutional:  Negative for chills and fever.  Genitourinary:  Positive for difficulty urinating and urgency.  All other systems reviewed and are negative.   Blood pressure 128/89, pulse 72, temperature 97.6 F (36.4 C), temperature source Oral, resp. rate 18, height 6' 1 (1.854 m), weight 81.2 kg, SpO2 100%. Physical Exam Vitals reviewed.  HENT:     Head: Normocephalic.  Eyes:     Pupils: Pupils are equal, round, and reactive to light.  Cardiovascular:     Rate and Rhythm: Normal rate.  Pulmonary:     Effort: Pulmonary effort is normal.  Abdominal:     General: Abdomen is flat.  Genitourinary:    Comments: No CVAT at present Musculoskeletal:        General: Normal range of motion.     Cervical back: Normal range of motion.  Skin:    General: Skin is warm.  Neurological:     General: No focal deficit present.     Mental Status: He is alert.  Psychiatric:        Mood and Affect: Mood normal.      Assessment/Plan  Proceed as planned with TURP / Cystolithalopexy. RIsks, benefits, alternatives, expected peri-op course discussed previously and reiterated today.   Charles Solis., MD 12/22/2023, 6:33  AM

## 2023-12-22 NOTE — Discharge Instructions (Signed)
1 - You may have urinary urgency (bladder spasms) and bloody urine on / off with catheter in place. This is normal. ° °2 - Call MD or go to ER for fever >102, severe pain / nausea / vomiting not relieved by medications, or acute change in medical status ° °

## 2023-12-22 NOTE — Op Note (Signed)
 NAME: Charles Solis, Charles Solis MEDICAL RECORD NO: [id number] ACCOUNT NO: [id number] DATE OF BIRTH: 07/09/1950 FACILITY: [facility] LOCATION: [facility] PHYSICIAN: Ricardo Likens, MD  Operative Report   DATE OF PROCEDURE: 12/22/2023  SURGEON: Ricardo Likens, MD  PREOPERATIVE DIAGNOSES: 1. Bladder stone. 2. Benign prostatic hypertrophy. 3. Refractory lower urinary tract symptoms.  PROCEDURES PERFORMED: 1. Cystolitholapaxy, stone less than 2.5 cm. 2. Transurethral resection of the prostate.  ESTIMATED BLOOD LOSS: 50 mL.  COMPLICATIONS: None.  SPECIMENS: 1. Bladder stone fragments given to the patient. 2. Prostate sent for permanent pathology.  FINDINGS: 1. Ball valving bladder stone approximately 1.8 cm adherent to the 6 o'clock position of the bladder neck and prostate junction. 2. Modest bilobar prostatic hypertrophy with kissing lobes pre-transurethral resection. 3. Complete laser ablation of all bladder stone fragments following laser cystolitholapaxy. 4. Wide open urinary channel from the bladder neck to the verumontanum following transurethral resection.  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 He is status post prior office-based outlet procedure, which helped some; however, his  symptoms remain progressive. He also has a history of very low-risk prostate cancer. He has been on surveillance without overt progression. Options were discussed for further management including continued medical therapy alone versus outlet procedures  including transurethral resection versus radical prostatectomy. We agreed given his age, stability of his very low-risk tumor, but progressive irritative symptoms that the most reasonable means would be a cystolitholapaxy and transurethral resection  given his prostate size less than 50 g. He presents for this today. Informed consent was signed  and placed in the medical record.  DESCRIPTION OF PROCEDURE:  The patient being identified and verified and the procedure being cystolitholapaxy with transurethral resection of the prostate was confirmed. Procedure timeout was performed.  Intravenous antibiotics were administered.   General LMA anesthesia was induced and the patient was placed into a low lithotomy position. A sterile field was created, prepping and draping the patient's penis, perineum, proximal thighs using iodine.  Cystourethroscopy was performed using a 21-French  rigid cystoscope with offset lens.  Inspection of the anterior and posterior urethra revealed some modest bilobar prostatic hypertrophy with kissing lobes. At the bladder neck, there was an approximately 1.8 cm bladder stone that was essentially  adherent to the 6 o'clock position of the bladder neck prostate junction, almost acting as a very large dystrophic calcification. There was some ball valving effect. The bladder was otherwise unremarkable. The ureteral orifices were single. No papillary  lesions noted.  Next, using the 900 nanometer laser fiber, cystolitholapaxy was performed using laser lithotripsy. Using settings of 0.5 joules and 20 Hz, and approximately 50% of the stone was dusted and 50% fragmented with the fragments being amenable to simple  irrigation and the fragments set aside to be given to the patient. This resulted in excellent resolution of the bladder stone.  The cystoscope was then exchanged with a 26-French resectoscope, which was used with the visual obturator in an atraumatic  fashion.   Attention was directed to transurethral resection, which was performed in a top-down fashion first at the 12 o'clock position from the bladder neck to the verumontanum down to the superficial fibromuscular stroma of the prostate capsule, then of the  right lobe of the prostate from the 12 o'clock to 6 o'clock position, taking exquisite care to avoid undermining  the bladder neck, and then finally at the left lobe of the prostate similarly  from the 12 o'clock to 6 o'clock position. This created a nice  wide open fossa from the verumontanum to the bladder neck. This did generate numerous prostate chips, which were irrigated and set aside for permanent pathology. The entire resection area was then fulgurated in a descending spiral type fashion. A Sensor  wire was placed at the level of the bladder and the resectoscope was exchanged for a 24-French 3-way Foley catheter over the Sensor working wire to minimize trauma to the bladder neck.  And 25 mL of sterile water was placed in the balloon.  This was  placed to normal saline irrigation and very light catheter strap traction. The procedure was terminated. The patient tolerated the procedure well. No immediate periprocedural complications. The patient was taken from the operating room to postanesthesia  care unit in stable condition. Plan for observation and admission. Likely discharge home tomorrow.    MUK D: 12/22/2023 8:32:17 am T: 12/22/2023 9:09:00 am  JOB: [id number]/ [id number]

## 2023-12-22 NOTE — Anesthesia Preprocedure Evaluation (Addendum)
 Anesthesia Evaluation  Patient identified by MRN, date of birth, ID band Patient awake    Reviewed: Allergy & Precautions, NPO status , Patient's Chart, lab work & pertinent test results, reviewed documented beta blocker date and time   Airway Mallampati: I  TM Distance: >3 FB Neck ROM: Full    Dental  (+) Teeth Intact, Dental Advisory Given   Pulmonary former smoker   Pulmonary exam normal breath sounds clear to auscultation       Cardiovascular hypertension (128/89 preop), Pt. on medications and Pt. on home beta blockers Normal cardiovascular exam Rhythm:Regular Rate:Normal  Echo 2024  1. Left ventricular ejection fraction, by estimation, is 55 to 60%. The  left ventricle has normal function. The left ventricle has no regional  wall motion abnormalities. Left ventricular diastolic parameters are  consistent with Grade I diastolic  dysfunction (impaired relaxation).   2. Right ventricular systolic function is normal. The right ventricular  size is normal. There is normal pulmonary artery systolic pressure. The  estimated right ventricular systolic pressure is 26.4 mmHg.   3. The mitral valve is normal in structure. Trivial mitral valve  regurgitation. No evidence of mitral stenosis.   4. The aortic valve is tricuspid. Aortic valve regurgitation is not  visualized. No aortic stenosis is present.   5. The inferior vena cava is normal in size with greater than 50%  respiratory variability, suggesting right atrial pressure of 3 mmHg.   Stress test 10/2023 normal    Neuro/Psych negative neurological ROS  negative psych ROS   GI/Hepatic negative GI ROS, Neg liver ROS,,,  Endo/Other  Hypothyroidism    Renal/GU negative Renal ROS  negative genitourinary   Musculoskeletal  (+) Arthritis , Osteoarthritis,    Abdominal   Peds  Hematology negative hematology ROS (+) Hb 13.7, plt 180   Anesthesia Other Findings    Reproductive/Obstetrics negative OB ROS                              Anesthesia Physical Anesthesia Plan  ASA: 2  Anesthesia Plan: General   Post-op Pain Management: Tylenol  PO (pre-op)*   Induction: Intravenous  PONV Risk Score and Plan: Ondansetron , Dexamethasone , Midazolam  and Treatment may vary due to age or medical condition  Airway Management Planned: LMA  Additional Equipment: None  Intra-op Plan:   Post-operative Plan: Extubation in OR  Informed Consent: I have reviewed the patients History and Physical, chart, labs and discussed the procedure including the risks, benefits and alternatives for the proposed anesthesia with the patient or authorized representative who has indicated his/her understanding and acceptance.     Dental advisory given  Plan Discussed with: CRNA  Anesthesia Plan Comments:          Anesthesia Quick Evaluation

## 2023-12-22 NOTE — Anesthesia Postprocedure Evaluation (Signed)
 Anesthesia Post Note  Patient: Charles Solis  Procedure(s) Performed: CYSTOSCOPY, WITH BLADDER CALCULUS LITHOLAPAXY (Bladder) TURP (TRANSURETHRAL RESECTION OF PROSTATE) (Prostate)     Patient location during evaluation: PACU Anesthesia Type: General Level of consciousness: awake and alert, oriented and patient cooperative Pain management: pain level controlled Vital Signs Assessment: post-procedure vital signs reviewed and stable Respiratory status: spontaneous breathing, nonlabored ventilation and respiratory function stable Cardiovascular status: blood pressure returned to baseline and stable Postop Assessment: no apparent nausea or vomiting Anesthetic complications: no   No notable events documented.  Last Vitals:  Vitals:   12/22/23 0825 12/22/23 0830  BP: 135/82 128/82  Pulse: 85 79  Resp: 14 15  Temp: (!) 36.3 C   SpO2: 100% 100%    Last Pain:  Vitals:   12/22/23 0825  TempSrc:   PainSc: 0-No pain                 [name]

## 2023-12-22 NOTE — Anesthesia Procedure Notes (Signed)
 Procedure Name: LMA Insertion Date/Time: 12/22/2023 7:30 AM  Performed by: Jahari Wiginton E, CRNAPre-anesthesia Checklist: Patient identified, Patient being monitored, Timeout performed, Emergency Drugs available and Suction available Patient Re-evaluated:Patient Re-evaluated prior to induction Oxygen  Delivery Method: Circle system utilized Preoxygenation: Pre-oxygenation with 100% oxygen  Induction Type: IV induction Ventilation: Mask ventilation without difficulty LMA: LMA inserted LMA Size: 4.0 Tube type: Oral Number of attempts: 1 Placement Confirmation: positive ETCO2 and breath sounds checked- equal and bilateral Tube secured with: Tape Dental Injury: Teeth and Oropharynx as per pre-operative assessment

## 2023-12-22 NOTE — Brief Op Note (Signed)
 12/22/2023  8:26 AM  PATIENT:  Charles Solis  73 y.o. male  PRE-OPERATIVE DIAGNOSIS:  PROSTATE HYPERTROPHY, BLADDER STONE  POST-OPERATIVE DIAGNOSIS:  PROSTATE HYPERTROPHY, BLADDER STONE  PROCEDURE:  Procedure(s): CYSTOSCOPY, WITH BLADDER CALCULUS LITHOLAPAXY (N/A) TURP (TRANSURETHRAL RESECTION OF PROSTATE) (N/A)  SURGEON:  Surgeons and Role:    * Manny, Ricardo KATHEE Raddle., MD - Primary  PHYSICIAN ASSISTANT:   ASSISTANTS: none   ANESTHESIA:   general  EBL:  50mL   BLOOD ADMINISTERED:none  DRAINS: 3 way foley to NS irrigation   LOCAL MEDICATIONS USED:  NONE  SPECIMEN:  Source of Specimen:  1 -bladder stone fragments (given to family); 2 - prostate chips (pathology)  DISPOSITION OF SPECIMEN:  PATHOLOGY  COUNTS:  YES  TOURNIQUET:  * No tourniquets in log *  DICTATION: .Other Dictation: Dictation Number [id number]  PLAN OF CARE: Admit for overnight observation  PATIENT DISPOSITION:  PACU - hemodynamically stable.   Delay start of Pharmacological VTE agent (>24hrs) due to surgical blood loss or risk of bleeding: yes

## 2023-12-23 ENCOUNTER — Encounter (HOSPITAL_COMMUNITY): Payer: Self-pay | Admitting: Urology

## 2023-12-23 DIAGNOSIS — N400 Enlarged prostate without lower urinary tract symptoms: Secondary | ICD-10-CM | POA: Diagnosis not present

## 2023-12-23 DIAGNOSIS — I10 Essential (primary) hypertension: Secondary | ICD-10-CM | POA: Diagnosis not present

## 2023-12-23 DIAGNOSIS — E039 Hypothyroidism, unspecified: Secondary | ICD-10-CM | POA: Diagnosis not present

## 2023-12-23 DIAGNOSIS — Z87891 Personal history of nicotine dependence: Secondary | ICD-10-CM | POA: Diagnosis not present

## 2023-12-23 DIAGNOSIS — I2510 Atherosclerotic heart disease of native coronary artery without angina pectoris: Secondary | ICD-10-CM | POA: Diagnosis not present

## 2023-12-23 DIAGNOSIS — Z7982 Long term (current) use of aspirin: Secondary | ICD-10-CM | POA: Diagnosis not present

## 2023-12-23 LAB — HEMOGLOBIN AND HEMATOCRIT, BLOOD
HCT: 41.1 % (ref 39.0–52.0)
Hemoglobin: 13.4 g/dL (ref 13.0–17.0)

## 2023-12-23 NOTE — Progress Notes (Signed)
 AVS reviewed with pt and pt visitor. Foley care education reviewed with support person present. IV removed. Pt discharged with all patient belongings.

## 2023-12-23 NOTE — Discharge Summary (Signed)
 Physician Discharge Summary  Patient ID: Charles Solis MRN: [id number] DOB/AGE: 73-May-1952 73 y.o.  Admit date: 12/22/2023 Discharge date: 12/23/2023  Admission Diagnoses:  Discharge Diagnoses:  Principal Problem:   Prostate hypertrophy   Discharged Condition: good  Hospital Course: Patient underwent TURP and cystolitholapaxy.  Tolerated the procedure well was stable postoperatively.  Following day his urine was clear yellow off CBI.  He was discharged in stable condition.  Consults: None  Significant Diagnostic Studies: None  Treatments: surgery: TURP, cystolitholapaxy  Discharge Exam: Blood pressure 115/74, pulse 91, temperature 97.8 F (36.6 C), temperature source Oral, resp. rate 16, height 6' 1 (1.854 m), weight 81.2 kg, SpO2 98%. General appearance: alert no acute distress Adequate perfusion of extremities Nonlabored respiration Abdomen nondistended Foley catheter draining clear yellow urine off CBI  Disposition: Discharge disposition: 01-Home or Self Care        Allergies as of 12/23/2023   No Known Allergies      Medication List     STOP taking these medications    silodosin 8 MG Caps capsule Commonly known as: RAPAFLO       TAKE these medications    aspirin EC 81 MG tablet Take 81 mg by mouth daily. Swallow whole.   cephALEXin 500 MG capsule Commonly known as: KEFLEX Take 1 capsule (500 mg total) by mouth 2 (two) times daily. X 3 days to prevent infection with catheter in place.   cyanocobalamin  1000 MCG tablet Commonly known as: VITAMIN B12 Take 1,000 mcg by mouth daily.   ezetimibe  10 MG tablet Commonly known as: ZETIA  TAKE ONE TABLET BY MOUTH EVERY DAY   fexofenadine 180 MG tablet Commonly known as: ALLEGRA Take 180 mg by mouth daily.   finasteride  5 MG tablet Commonly known as: PROSCAR  Take 5 mg by mouth daily.   ipratropium 0.03 % nasal spray Commonly known as: ATROVENT  Place 1 spray into both nostrils daily.    lidocaine -prilocaine cream Commonly known as: EMLA Apply to affected area once   metoprolol  tartrate 25 MG tablet Commonly known as: LOPRESSOR  Take 1 tablet (25 mg total) by mouth 2 (two) times daily.   ondansetron  8 MG tablet Commonly known as: Zofran  Take 1 tablet (8 mg total) by mouth every 8 (eight) hours as needed for nausea or vomiting.   oxyCODONE-acetaminophen  5-325 MG tablet Commonly known as: Percocet Take 1 tablet by mouth every 6 (six) hours as needed for moderate pain (pain score 4-6) or severe pain (pain score 7-10) (post-operatively).   prochlorperazine 10 MG tablet Commonly known as: COMPAZINE Take 1 tablet (10 mg total) by mouth every 6 (six) hours as needed for nausea or vomiting.   pseudoephedrine 30 MG tablet Commonly known as: SUDAFED Take 30 mg by mouth every 4 (four) hours as needed for congestion.   rosuvastatin  20 MG tablet Commonly known as: CRESTOR  Take 1 tablet (20 mg total) by mouth daily.   Saw Palmetto  500 MG Caps Take 1 capsule 3 (three) times daily by mouth.   senna-docusate 8.6-50 MG tablet Commonly known as: Senokot-S Take 1 tablet by mouth 2 (two) times daily. While taking strong pain meds to prevent constipation   tadalafil  5 MG tablet Commonly known as: CIALIS  Take 1 tablet by mouth daily.        Follow-up Information     Alvaro Ricardo KATHEE Raddle., MD Follow up on 12/26/2023.   Specialty: Urology Why: at 10 AM for MD visit, pathology review, and catheter removal Contact information: [address] [place] [address] [phone]                 Signed: Sherwood JONETTA Edison, III 12/23/2023, 11:50 AM

## 2023-12-25 ENCOUNTER — Encounter (HOSPITAL_COMMUNITY): Payer: Self-pay

## 2023-12-25 ENCOUNTER — Other Ambulatory Visit: Payer: Self-pay

## 2023-12-25 ENCOUNTER — Ambulatory Visit (HOSPITAL_COMMUNITY)
Admission: RE | Admit: 2023-12-25 | Discharge: 2023-12-25 | Disposition: A | Discharge status: Home / Self Care | Source: Ambulatory Visit | Attending: Oncology | Admitting: Oncology

## 2023-12-25 ENCOUNTER — Ambulatory Visit (HOSPITAL_COMMUNITY)
Admission: RE | Admit: 2023-12-25 | Discharge: 2023-12-25 | Disposition: A | Source: Ambulatory Visit | Attending: Oncology

## 2023-12-25 DIAGNOSIS — Z85828 Personal history of other malignant neoplasm of skin: Secondary | ICD-10-CM | POA: Diagnosis not present

## 2023-12-25 DIAGNOSIS — Z8546 Personal history of malignant neoplasm of prostate: Secondary | ICD-10-CM | POA: Diagnosis not present

## 2023-12-25 DIAGNOSIS — C4492 Squamous cell carcinoma of skin, unspecified: Secondary | ICD-10-CM | POA: Insufficient documentation

## 2023-12-25 DIAGNOSIS — R5381 Other malaise: Secondary | ICD-10-CM

## 2023-12-25 DIAGNOSIS — C44209 Unspecified malignant neoplasm of skin of left ear and external auricular canal: Secondary | ICD-10-CM

## 2023-12-25 DIAGNOSIS — C4490 Unspecified malignant neoplasm of skin, unspecified: Secondary | ICD-10-CM

## 2023-12-25 DIAGNOSIS — C770 Secondary and unspecified malignant neoplasm of lymph nodes of head, face and neck: Secondary | ICD-10-CM

## 2023-12-25 HISTORY — PX: IR IMAGING GUIDED PORT INSERTION: IMG5740

## 2023-12-25 LAB — SURGICAL PATHOLOGY

## 2023-12-25 MED ORDER — MIDAZOLAM HCL (PF) 2 MG/2ML IJ SOLN
INTRAMUSCULAR | Status: AC | PRN
  Administered 2023-12-25: 1 mg via INTRAVENOUS

## 2023-12-25 MED ORDER — MIDAZOLAM HCL 2 MG/2ML IJ SOLN
INTRAMUSCULAR | Status: AC
  Filled 2023-12-25: qty 2

## 2023-12-25 MED ORDER — HEPARIN SOD (PORK) LOCK FLUSH 100 UNIT/ML IV SOLN
INTRAVENOUS | Status: AC
  Filled 2023-12-25: qty 5

## 2023-12-25 MED ORDER — SODIUM CHLORIDE 0.9 % IV SOLN: INTRAVENOUS | Status: DC

## 2023-12-25 MED ORDER — LIDOCAINE HCL 1 % IJ SOLN
INTRAMUSCULAR | Status: AC
  Filled 2023-12-25: qty 20

## 2023-12-25 MED ORDER — HEPARIN SOD (PORK) LOCK FLUSH 100 UNIT/ML IV SOLN
500.0000 [IU] | Freq: Once | INTRAVENOUS | Status: AC
  Administered 2023-12-25: 500 [IU] via INTRAVENOUS

## 2023-12-25 MED ORDER — LIDOCAINE-EPINEPHRINE 1 %-1:100000 IJ SOLN
20.0000 mL | Freq: Once | INTRAMUSCULAR | Status: AC
  Administered 2023-12-25: 18 mL via INTRADERMAL

## 2023-12-25 MED ORDER — MIDAZOLAM HCL (PF) 2 MG/2ML IJ SOLN
INTRAMUSCULAR | Status: AC | PRN
Start: 2023-12-25 — End: 2023-12-25
  Administered 2023-12-25: 1 mg via INTRAVENOUS

## 2023-12-25 NOTE — Procedures (Signed)
 Interventional Radiology Procedure Note  Procedure: Single Lumen Power Port Placement    Access:  Right IJ vein.  Findings: Catheter tip positioned at SVC/RA junction. Port is ready for immediate use.   Complications: None  EBL: < 10 mL  Recommendations:  - Ok to shower in 24 hours - Do not submerge for 7 days - Routine line care   Maxi Carreras T. Fredia Sorrow, M.D Pager:  [phone]

## 2023-12-25 NOTE — Sedation Documentation (Addendum)
 RN McKenzie pulled 4 mg Versed  in IR pyxis room 1.Pt. Received 3 mg Versed  throughout the procedure. McKenzie RN and Glass Blower/designer wasted 1 mg of Versed  in IR Room 1 pyxis.

## 2023-12-25 NOTE — H&P (Addendum)
 Chief Complaint: Patient was seen in consultation today for squamous cell skin cancer  Referring Physician(s): Pasam,Avinash  Supervising Physician: Luverne Aran  Patient Status: [facility] - Out-pt  History of Present Illness: Charles Solis is a 73 y.o. 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  No evidence of metastasis, however given depth of tumor invasion to the dermis and infiltrates in the subcutaneous adipose to superficially invade the underlying parotid gland he is planning for upcoming immunotherapy.  He is referred to IR for Port-A-Cath placement.   Charles Solis presents to [facility] Radiology today in his usual state of health.  He did have breakfast this AM at 0800. He does not take blood thinners.  He is recovering from recent TURP with Dr. Carolee 12/22/23 without issue.  He is aware of goals, risks, and benefits of the procedure today and is agreeable to proceed.   Past Medical History:  Diagnosis Date   Arthritis    Cancer (HCC) 05/31/23   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   Coronary artery disease    High cholesterol    Hypothyroidism    Prostate cancer [facility])     Past Surgical History:  Procedure Laterality Date   COLONOSCOPY     CYSTOSCOPY WITH LITHOLAPAXY N/A 12/22/2023   Procedure: CYSTOSCOPY, WITH BLADDER CALCULUS LITHOLAPAXY;  Surgeon: Alvaro Ricardo KATHEE Mickey., MD;  Location: [facility] ORS;  Service: Urology;  Laterality: N/A;   ESOPHAGOGASTRODUODENOSCOPY ENDOSCOPY     HERNIA REPAIR Right    inguinal   MOHS SURGERY     head,ear,cheek,nose   OTOPLASATY Left 09/13/2023   Procedure: OTOPLASTY;  Surgeon: Jesus Oliphant, MD;  Location: [facility] OR;  Service: ENT;  Laterality: Left;  Left wedge resection of pinna with primary closure.   PAROTIDECTOMY Left 09/13/2023   Procedure: EXCISION, PAROTID GLAND;  Surgeon: Jesus Oliphant, MD;  Location: [facility] OR;   Service: ENT;  Laterality: Left;   RADICAL NECK DISSECTION Left 09/13/2023   Procedure: DISSECTION, NECK, RADICAL;  Surgeon: Jesus Oliphant, MD;  Location: [facility] OR;  Service: ENT;  Laterality: Left;   TONSILLECTOMY     TRANSURETHRAL RESECTION OF PROSTATE N/A 12/22/2023   Procedure: TURP (TRANSURETHRAL RESECTION OF PROSTATE);  Surgeon: Alvaro Ricardo KATHEE Mickey., MD;  Location: [facility] ORS;  Service: Urology;  Laterality: N/A;    Allergies: Patient has no known allergies.  Medications: Prior to Admission medications   Medication Sig Start Date End Date Taking? Authorizing Provider  aspirin EC 81 MG tablet Take 81 mg by mouth daily. Swallow whole.    [provider]  cephALEXin (KEFLEX) 500 MG capsule Take 1 capsule (500 mg total) by mouth 2 (two) times daily. X 3 days to prevent infection with catheter in place. 12/22/23   Alvaro Ricardo KATHEE Mickey., MD  cyanocobalamin  (VITAMIN B12) 1000 MCG tablet Take 1,000 mcg by mouth daily.    [provider]  ezetimibe  (ZETIA ) 10 MG tablet TAKE ONE TABLET BY MOUTH EVERY DAY 10/27/23   Kate Lonni CROME, MD  fexofenadine (ALLEGRA) 180 MG tablet Take 180 mg by mouth daily.    [provider]  finasteride  (PROSCAR ) 5 MG tablet Take 5 mg by mouth daily. 08/05/22   [provider]  ipratropium (ATROVENT ) 0.03 % nasal spray Place 1 spray into both nostrils daily. 10/01/19   [provider]  lidocaine -prilocaine (EMLA) cream Apply to affected area once 12/07/23  Pasam, Avinash, MD  metoprolol  tartrate (LOPRESSOR ) 25 MG tablet Take 1 tablet (25 mg total) by mouth 2 (two) times daily. 07/25/23   Nahser, Aleene PARAS, MD  ondansetron  (ZOFRAN ) 8 MG tablet Take 1 tablet (8 mg total) by mouth every 8 (eight) hours as needed for nausea or vomiting. 12/07/23   Pasam, Chinita, MD  oxyCODONE-acetaminophen  (PERCOCET) 5-325 MG tablet Take 1 tablet by mouth every 6 (six) hours as needed for moderate pain (pain score 4-6) or severe pain (pain score  7-10) (post-operatively). 12/22/23 12/21/24  Alvaro Ricardo KATHEE Mickey., MD  prochlorperazine (COMPAZINE) 10 MG tablet Take 1 tablet (10 mg total) by mouth every 6 (six) hours as needed for nausea or vomiting. 12/07/23   Pasam, Avinash, MD  pseudoephedrine (SUDAFED) 30 MG tablet Take 30 mg by mouth every 4 (four) hours as needed for congestion.    [provider]  rosuvastatin  (CRESTOR ) 20 MG tablet Take 1 tablet (20 mg total) by mouth daily. 10/27/23   Kate Lonni CROME, MD  Saw Palmetto  500 MG CAPS Take 1 capsule 3 (three) times daily by mouth.    [provider]  senna-docusate (SENOKOT-S) 8.6-50 MG tablet Take 1 tablet by mouth 2 (two) times daily. While taking strong pain meds to prevent constipation 12/22/23   Alvaro Ricardo KATHEE Mickey., MD  tadalafil  (CIALIS ) 5 MG tablet Take 1 tablet by mouth daily.    [provider]     Family History  Problem Relation Age of Onset   Alzheimer's disease Mother    Hypertension Mother    Heart attack Father    Colon cancer Father    Cancer Father    Coronary artery disease Maternal Grandfather    Heart attack Maternal Grandfather    Heart disease Maternal Grandfather    Alzheimer's disease Paternal Grandmother    Lung cancer Paternal Grandfather    Heart disease Paternal Grandfather    Hyperlipidemia Sister     Social History   Socioeconomic History   Marital status: Divorced    Spouse name: Not on file   Number of children: 0   Years of education: Not on file   Highest education level: Not on file  Occupational History   Not on file  Tobacco Use   Smoking status: Former    Types: Cigarettes    Start date: 19    Quit date: 1970    Years since quitting: 55.8    Passive exposure: Never   Smokeless tobacco: Never  Vaping Use   Vaping status: Never Used  Substance and Sexual Activity   Alcohol use: Not Currently    Alcohol/week: 1.0 standard drink of alcohol    Types: 1 Cans of beer per week   Drug use: No    Sexual activity: Not on file  Other Topics Concern   Not on file  Social History Narrative   Not on file   Social Drivers of Health   Financial Resource Strain: Not on file  Food Insecurity: No Food Insecurity (12/22/2023)   Hunger Vital Sign    Worried About Running Out of Food in the Last Year: Never true    Ran Out of Food in the Last Year: Never true  Transportation Needs: No Transportation Needs (12/22/2023)   PRAPARE - Administrator, Civil Service (Medical): No    Lack of Transportation (Non-Medical): No  Physical Activity: Not on file  Stress: Not on file  Social Connections: Moderately Integrated (12/22/2023)   Social  Connection and Isolation Panel    Frequency of Communication with Friends and Family: Twice a week    Frequency of Social Gatherings with Friends and Family: Twice a week    Attends Religious Services: More than 4 times per year    Active Member of [employer] or Organizations: No    Attends Banker Meetings: Never    Marital Status: Living with partner     Review of Systems: A 12 point ROS discussed and pertinent positives are indicated in the HPI above.  All other systems are negative.  Review of Systems  Constitutional:  Negative for fatigue and fever.  Respiratory:  Negative for cough and shortness of breath.   Cardiovascular:  Negative for chest pain.  Gastrointestinal:  Negative for abdominal pain, nausea and vomiting.  Psychiatric/Behavioral:  Negative for behavioral problems and confusion.     Vital Signs: BP 121/89   Pulse 67   Temp 97.7 F (36.5 C) (Oral)   Resp 18   Ht 6' 1 (1.854 m)   Wt 179 lb 0.2 oz (81.2 kg)   SpO2 97%   BMI 23.62 kg/m   Physical Exam Vitals and nursing note reviewed.  Constitutional:      General: He is not in acute distress.    Appearance: Normal appearance. He is not ill-appearing.  HENT:     Mouth/Throat:     Mouth: Mucous membranes are moist.     Pharynx: Oropharynx is clear.   Cardiovascular:     Rate and Rhythm: Normal rate and regular rhythm.  Pulmonary:     Effort: Pulmonary effort is normal. No respiratory distress.  Abdominal:     General: Abdomen is flat. There is no distension.  Musculoskeletal:     Cervical back: Normal range of motion and neck supple. No tenderness.  Neurological:     General: No focal deficit present.     Mental Status: He is alert and oriented to person, place, and time.  Psychiatric:        Mood and Affect: Mood normal.        Behavior: Behavior normal.        Thought Content: Thought content normal.        Judgment: Judgment normal.      MD Evaluation Airway: WNL Heart: WNL Abdomen: WNL Chest/ Lungs: WNL ASA  Classification: 2 Mallampati/Airway Score: Two   Imaging: No results found.  Labs:  CBC: Recent Labs    09/11/23 1059 12/11/23 1427 12/23/23 0552  WBC 7.6 4.2  --   HGB 13.9 13.7 13.4  HCT 41.4 41.7 41.1  PLT 208 180  --     COAGS: No results for input(s): INR, APTT in the last 8760 hours.  BMP: Recent Labs    09/11/23 1059 10/18/23 0811 12/11/23 1427  NA 140  --  137  K 3.8  --  4.2  CL 108  --  104  CO2 24  --  26  GLUCOSE 95  --  98  BUN 19 25* 12  CALCIUM  9.2  --  9.4  CREATININE 0.91 0.77 0.83  GFRNONAA >60 >60 >60    LIVER FUNCTION TESTS: No results for input(s): BILITOT, AST, ALT, ALKPHOS, PROT, ALBUMIN  in the last 8760 hours.  TUMOR MARKERS: No results for input(s): AFPTM, CEA, CA199, CHROMGRNA in the last 8760 hours.  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  He presents to [facility] Radiology today for  Port-A-Cath placement.  Case reviewed by Dr. Luverne who approves patient for procedure.  Patient presents today in their usual state of health.  Unfortunately, he was not NPO today.  We will proceed with IV start and one medication with local anesthetic to prevent delayed treatment.  This was discussed with the patient who is agreeable.   Risks and benefits of image guided port-a-catheter placement was discussed with the patient including, but not limited to bleeding, infection, pneumothorax, or fibrin sheath development and need for additional procedures.  All of the patient's questions were answered, patient is agreeable to proceed. Consent signed and in chart.   Thank you for this interesting consult.  I greatly enjoyed meeting DRU PRIMEAU and look forward to participating in their care.  A copy of this report was sent to the requesting provider on this date.  Electronically Signed: Henry Utsey Sue-Ellen Amaliya Whitelaw, PA 12/25/2023, 10:13 AM   I spent a total of  30 Minutes   in face to face in clinical consultation, greater than 50% of which was counseling/coordinating care for cutaneous squamous cell carcinoma.

## 2023-12-25 NOTE — Discharge Instructions (Signed)
Discharge Instructions:   Please call Interventional Radiology clinic [phone] with any questions or concerns.  You may remove your dressing and shower tomorrow.  Do not use EMLA / Lidocaine cream for 2 weeks post Port Insertion.   Moderate Conscious Sedation, Adult, Care After This sheet gives you information about how to care for yourself after your procedure. Your health care provider may also give you more specific instructions. If you have problems or questions, contact your health care provider. What can I expect after the procedure? After the procedure, it is common to have: Sleepiness for several hours. Impaired judgment for several hours. Difficulty with balance. Vomiting if you eat too soon. Follow these instructions at home: For the time period you were told by your health care provider:  Rest. Do not participate in activities where you could fall or become injured. Do not drive or use machinery. Do not drink alcohol. Do not take sleeping pills or medicines that cause drowsiness. Do not make important decisions or sign legal documents. Do not take care of children on your own. Eating and drinking  Follow the diet recommended by your health care provider. Drink enough fluid to keep your urine pale yellow. If you vomit: Drink water, juice, or soup when you can drink without vomiting. Make sure you have little or no nausea before eating solid foods. General instructions Take over-the-counter and prescription medicines only as told by your health care provider. Have a responsible adult stay with you for the time you are told. It is important to have someone help care for you until you are awake and alert. Do not smoke. Keep all follow-up visits as told by your health care provider. This is important. Contact a health care provider if: You are still sleepy or having trouble with balance after 24 hours. You feel light-headed. You keep feeling nauseous or you keep  vomiting. You develop a rash. You have a fever. You have redness or swelling around the IV site. Get help right away if: You have trouble breathing. You have new-onset confusion at home. Summary After the procedure, it is common to feel sleepy, have impaired judgment, or feel nauseous if you eat too soon. Rest after you get home. Know the things you should not do after the procedure. Follow the diet recommended by your health care provider and drink enough fluid to keep your urine pale yellow. Get help right away if you have trouble breathing or new-onset confusion at home. This information is not intended to replace advice given to you by your health care provider. Make sure you discuss any questions you have with your health care provider. Document Revised: 05/31/2019 Document Reviewed: 12/27/2018 Elsevier Patient Education  2023 Elsevier Inc.    Implanted Port Insertion, Care After The following information offers guidance on how to care for yourself after your procedure. Your health care provider may also give you more specific instructions. If you have problems or questions, contact your health care provider. What can I expect after the procedure? After the procedure, it is common to have: Discomfort at the port insertion site. Bruising on the skin over the port. This should improve over 3-4 days. Follow these instructions at home: Port care After your port is placed, you will get a manufacturer's information card. The card has information about your port. Keep this card with you at all times. Take care of the port as told by your health care provider. Ask your health care provider if you or a family   member can get training for taking care of the port at home. A home health care nurse will be be available to help care for the port. Make sure to remember what type of port you have. Incision care     Follow instructions from your health care provider about how to take care of  your port insertion site. Make sure you: Wash your hands with soap and water for at least 20 seconds before and after you change your bandage (dressing). If soap and water are not available, use hand sanitizer. Change your dressing as told by your health care provider. Leave stitches (sutures), skin glue, or adhesive strips in place. These skin closures may need to stay in place for 2 weeks or longer. If adhesive strip edges start to loosen and curl up, you may trim the loose edges. Do not remove adhesive strips completely unless your health care provider tells you to do that. Check your port insertion site every day for signs of infection. Check for: Redness, swelling, or pain. Fluid or blood. Warmth. Pus or a bad smell. Activity Return to your normal activities as told by your health care provider. Ask your health care provider what activities are safe for you. You may have to avoid lifting. Ask your health care provider how much you can safely lift. General instructions Take over-the-counter and prescription medicines only as told by your health care provider. Do not take baths, swim, or use a hot tub until your health care provider approves. Ask your health care provider if you may take showers. You may only be allowed to take sponge baths. If you were given a sedative during the procedure, it can affect you for several hours. Do not drive or operate machinery until your health care provider says that it is safe. Wear a medical alert bracelet in case of an emergency. This will tell any health care providers that you have a port. Keep all follow-up visits. This is important. Contact a health care provider if: You cannot flush your port with saline as directed, or you cannot draw blood from the port. You have a fever or chills. You have redness, swelling, or pain around your port insertion site. You have fluid or blood coming from your port insertion site. Your port insertion site feels warm  to the touch. You have pus or a bad smell coming from the port insertion site. Get help right away if: You have chest pain or shortness of breath. You have bleeding from your port that you cannot control. These symptoms may be an emergency. Get help right away. Call 911. Do not wait to see if the symptoms will go away. Do not drive yourself to the hospital. Summary Take care of the port as told by your health care provider. Keep the manufacturer's information card with you at all times. Change your dressing as told by your health care provider. Contact a health care provider if you have a fever or chills or if you have redness, swelling, or pain around your port insertion site. Keep all follow-up visits. This information is not intended to replace advice given to you by your health care provider. Make sure you discuss any questions you have with your health care provider. Document Revised: 08/04/2020 Document Reviewed: 08/04/2020 Elsevier Patient Education  2023 Elsevier Inc.    

## 2023-12-25 NOTE — Progress Notes (Signed)
 1400 Ice pack given to use prn to right neck and chest for comfort as instructed.

## 2023-12-26 DIAGNOSIS — C61 Malignant neoplasm of prostate: Secondary | ICD-10-CM | POA: Diagnosis not present

## 2023-12-26 DIAGNOSIS — R3915 Urgency of urination: Secondary | ICD-10-CM | POA: Diagnosis not present

## 2023-12-27 NOTE — Progress Notes (Addendum)
 Pharmacist Chemotherapy Monitoring - Initial Assessment    Anticipated start date: 01/03/24    The following has been reviewed per standard work regarding the patient's treatment regimen: The patient's diagnosis, treatment plan and drug doses, and organ/hematologic function Lab orders and baseline tests specific to treatment regimen  The treatment plan start date, drug sequencing, and pre-medications Prior authorization status  Patient's documented medication list, including drug-drug interaction screen and prescriptions for anti-emetics and supportive care specific to the treatment regimen The drug concentrations, fluid compatibility, administration routes, and timing of the medications to be used The patient's access for treatment and lifetime cumulative dose history, if applicable  The patient's medication allergies and previous infusion related reactions, if applicable   Changes made to treatment plan:  N/A  Follow up needed:  1) f/u CMET on day of treatment   Bridgett Leotis Helling, RPH, BCPS, BCOP 12/27/2023   11:39 AM

## 2024-01-02 ENCOUNTER — Ambulatory Visit: Payer: Self-pay | Attending: Radiation Oncology | Admitting: Physical Therapy

## 2024-01-02 ENCOUNTER — Encounter: Payer: Self-pay | Admitting: Physical Therapy

## 2024-01-02 DIAGNOSIS — C44209 Unspecified malignant neoplasm of skin of left ear and external auricular canal: Secondary | ICD-10-CM | POA: Insufficient documentation

## 2024-01-02 DIAGNOSIS — R293 Abnormal posture: Secondary | ICD-10-CM | POA: Insufficient documentation

## 2024-01-02 DIAGNOSIS — M25612 Stiffness of left shoulder, not elsewhere classified: Secondary | ICD-10-CM | POA: Insufficient documentation

## 2024-01-02 DIAGNOSIS — C770 Secondary and unspecified malignant neoplasm of lymph nodes of head, face and neck: Secondary | ICD-10-CM | POA: Insufficient documentation

## 2024-01-02 DIAGNOSIS — C4490 Unspecified malignant neoplasm of skin, unspecified: Secondary | ICD-10-CM | POA: Insufficient documentation

## 2024-01-02 DIAGNOSIS — M25512 Pain in left shoulder: Secondary | ICD-10-CM | POA: Insufficient documentation

## 2024-01-02 NOTE — Therapy (Signed)
 OUTPATIENT PHYSICAL THERAPY HEAD AND NECK POST RADIATION FOLLOW UP   Patient Name: Charles Solis MRN: [id number] DOB:Oct 15, 1950, 73 y.o., male Today's Date: 01/02/2024  END OF SESSION:  PT End of Session - 01/02/24 1039     Visit Number 2    Number of Visits 10    Date for Recertification  01/30/24    PT Start Time 0956    PT Stop Time 1048    PT Time Calculation (min) 52 min    Activity Tolerance Patient tolerated treatment well    Behavior During Therapy [facility] for tasks assessed/performed          Past Medical History:  Diagnosis Date   Arthritis    Cancer (HCC) 05/31/23   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   Coronary artery disease    High cholesterol    Hypothyroidism    Prostate cancer [facility])    Past Surgical History:  Procedure Laterality Date   COLONOSCOPY     CYSTOSCOPY WITH LITHOLAPAXY N/A 12/22/2023   Procedure: CYSTOSCOPY, WITH BLADDER CALCULUS LITHOLAPAXY;  Surgeon: Alvaro Ricardo KATHEE Mickey., MD;  Location: [facility] ORS;  Service: Urology;  Laterality: N/A;   ESOPHAGOGASTRODUODENOSCOPY ENDOSCOPY     HERNIA REPAIR Right    inguinal   IR IMAGING GUIDED PORT INSERTION  12/25/2023   MOHS SURGERY     head,ear,cheek,nose   OTOPLASATY Left 09/13/2023   Procedure: OTOPLASTY;  Surgeon: Jesus Oliphant, MD;  Location: [facility] OR;  Service: ENT;  Laterality: Left;  Left wedge resection of pinna with primary closure.   PAROTIDECTOMY Left 09/13/2023   Procedure: EXCISION, PAROTID GLAND;  Surgeon: Jesus Oliphant, MD;  Location: [facility] OR;  Service: ENT;  Laterality: Left;   RADICAL NECK DISSECTION Left 09/13/2023   Procedure: DISSECTION, NECK, RADICAL;  Surgeon: Jesus Oliphant, MD;  Location: [facility] OR;  Service: ENT;  Laterality: Left;   TONSILLECTOMY     TRANSURETHRAL RESECTION OF PROSTATE N/A 12/22/2023   Procedure: TURP (TRANSURETHRAL RESECTION OF PROSTATE);  Surgeon: Alvaro Ricardo KATHEE Mickey., MD;  Location: [facility] OR[facility];  Service: Urology;  Laterality: N/A;   Patient Active Problem List   Diagnosis Date  Noted   Prostate hypertrophy 12/22/2023   Cancer of skin of left ear 10/04/2023   Skin cancer 10/04/2023   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   Sensorineural hearing loss, bilateral 05/23/2023   HTN (hypertension) 11/16/2018   Hyperlipidemia 09/24/2014    PCP: None  REFERRING PROVIDER: Lauraine Golden, MD  REFERRING DIAG: C44.209 (ICD-10-CM) - Cancer of skin of left ear C44.90 (ICD-10-CM) - Skin cancer C77.0 (ICD-10-CM) - Secondary malignant neoplasm of cervical lymph node (HCC)  THERAPY DIAG:  Acute pain of left shoulder  Stiffness of left shoulder, not elsewhere classified  Abnormal posture  Skin cancer  Cancer of skin of left ear  Secondary malignant neoplasm of cervical lymph node (HCC)  Rationale for Evaluation and Treatment: Rehabilitation  ONSET DATE: 08/31/23  SUBJECTIVE:  SUBJECTIVE STATEMENT: I am doing fine. I have some more flexibility. I have been doing the elliptical and light weights. I have an awful knot in my left trapezius that will not go away. I can not raise my arm out to the side. I had no mobility issues before the surgery.   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 A few months ago he developed a lesion located on his left jaw area. Lesion had continued to increase in size. To investigate his symptoms he underwent a CT neck on 07/18/23 showing an ovoid centrally hypodense lesion present posteriorly within or abutting the superficial lobe of the left parotid, measuring approximately 22 x 17 x 18 mm. 08/31/23 He underwent a FNA. Pathology indicated the presence of malignant cells, carcinoma with squamous differentiation with no lymphoid tissue identified. 09/04/23 He presented to Dr. Jesus for  second opinion. Based on his recommendations, patient underwent a parotidectomy with radical neck dissection on 09/13/23. Surgical pathology redemonstrated tumor size of 3.5 x 3.2 x 2.3 cm and histology of invasive moderately differentiated squamous cell carcinoma with tumor present in dermis and infiltrates the subcutaneous adipose to superficially invade the underlying parotid gland. Tumor present in 3:00 margin but is negative for angiolymphatic and perineural invasion. Neck dissection pathology of 20 lymph nodes are negative for carcinoma. 09/28/23 PET showing no evidence of metastatic disease. 10/03/23 Consult with Dr. Izell. 10/06/23 Consult with Dr. Autumn. He will receive radiation then after it's completed immunotherapy with Libtayo every 3 weeks for one year. He will receive 33 fractions of radiation to his left parotid which will start on 10/25/23 and will complete 12/11/23.   PATIENT GOALS:  Reassess how my recovery is going related to neck ROM, cervical pain, fatigue, and swelling.  PAIN:  Are you having pain? No not at rest,  does have pain with movement, rated 4/10, trying to move the arm makes the pain worse, nothing helps the pain  PRECAUTIONS: Recent radiation, Head and neck lymphedema risk,    OBJECTIVE:   POSTURE:  Forward head and rounded shoulders posture   30 SEC SIT TO STAND: 01/02/24: not done today because pt reports it caused knee pain the last time - he has to have cortisone shots in his knees At eval on 10/19/23: 13 reps in 30 sec without use of UEs which is  Good for patient's age   SHOULDER AROM:   At eval: WFL some discomfort in the L shoulder at end range, pt thinks he may have injured it lifting something heavy    UPPER EXTREMITY AROM/PROM:  A/PROM RIGHT   01/02/24   Shoulder extension 72  Shoulder flexion 161  Shoulder abduction 160  Shoulder internal rotation 55  Shoulder external rotation 88    (Blank rows = not tested)  A/PROM LEFT   01/02/24   Shoulder extension 71  Shoulder flexion 126  Shoulder abduction 55  Shoulder internal rotation Not tested due to pain  Shoulder external rotation Not tested due to pain    (Blank rows = not tested)   CERVICAL AROM:     Percent limited Eval 10/19/23 01/02/24  Flexion WFL WFL  Extension [facility] WFL  Right lateral flexion WFL 50% limited  Left lateral flexion 25% limited 50% limited  Right rotation WFL 25% limited  Left rotation 25% limited 35% limited                          (  Blank rows=not tested)    LYMPHEDEMA ASSESSMENT:    Circumference in cm  4 cm superior to sternal notch around neck 39.1  6 cm superior to sternal notch around neck 38  8 cm superior to sternal notch around neck 38.2  R lateral nostril from base of nose to medial tragus   L lateral nostril from base of nose to medial tragus   R corner of mouth to where ear lobe meets face   L corner of mouth to where ear lobe meets face         (Blank rows=not tested)  OBSERVATION: L shoulder drooped compared to R, winging of L scapula, initially hiking of L scapula when attempting to raise UE, L scapula protracted  OTHER SYMPTOMS: Pain Yes Fibrosis No Pitting edema No Infections No Decreased scar mobility Yes  TREATMENT: 01/02/24: Manual therapy: in R sidelying: L scapular mobilizations in all directions with increased tightness initially palpated but improved by end of session while educating pt in proper glenohumeral rhythm and compensatory patterns Therapeutic Activity: In R sidelying: L shoulder flexion with therapist initially providing tacctile cues to keep scapular muscles engaged then pt able to do without tactile cues x 8 reps, L shoulder abduction with initial tactile cues but then pt able to engage, ER with 1 lb with towel under elbow x 10 In standing: row with red theraband x 20 reps with v/c   PATIENT EDUCATION:  Education details: sidelying exercises - see education section, proper glenohumeral  rhythm, muscle memory, how neck dissection surgery effects nerves which effects how muscles function Person educated: Patient Education method: Explanation, Demonstration, Tactile cues, Verbal cues, and Handouts Education comprehension: verbalized understanding and returned demonstration  HOME EXERCISE PROGRAM: Reviewed previously given post op HEP. Access Code: [email or web address] URL: [email or web address] Date: 01/02/2024 Prepared by: Florina Lanis Carbon  Exercises - Sidelying Shoulder External Rotation with Dumbbell  - 1 x daily - 7 x weekly - 1-3 sets - 10 reps - Sidelying Shoulder Abduction Full Range of Motion  - 1 x daily - 7 x weekly - 1-3 sets - 10 reps - Sidelying Shoulder Flexion 15 Degrees  - 1 x daily - 7 x weekly - 1-3 sets - 10 reps - Standing Shoulder Row with Anchored Resistance  - 1 x daily - 7 x weekly - 1-3 sets - 10 reps  ASSESSMENT:  CLINICAL IMPRESSION: Pt returns to PT after completing radiation for treatment of cancer of cervical lymph node. He previously had a left neck dissection on 09/13/23. He has limited L shoulder ROM especially in to L shoulder abduction. He has pain with raising in to abduction and at end range flexion. He has been limited since surgery with his L shoulder ROM. He also demonstrated increased cervical tightness today with limited bilateral lateral flexion and bilateral rotation which has worsened since baseline. Began educating pt today on proper kinematics of the shoulder joint and on glenohumeral rhythm and how neck dissection surgery can effect this. Began instructing pt in L shoulder ROM/strengthening exercises with focus on proper glenohumeral rhythm. Pt would benefit from skilled PT services to improve L shoulder ROM and strength, improve cervical ROM and decrease tightness and progress pt towards independence in a home exercise program.   Pt will benefit from skilled therapeutic intervention to improve on the following deficits:  decreased knowledge of condition, decreased ROM, decreased strength, increased fascial restrictions, impaired UE functional use, postural dysfunction, and pain  PT treatment/interventions: ADL/Self care home management, [phone]- PT  Re-evaluation, 97750- Physical Performance Testing, 97110-Therapeutic exercises, 97530- Therapeutic activity, [id number]- Neuromuscular re-education, 97535- Self Care, [id number]- Manual therapy, 97760- Orthotic Initial, [phone]- Orthotic/Prosthetic subsequent, and Patient/Family education     GOALS: Goals reviewed with patient? Yes  LONG TERM GOALS:  (STG=LTG)  GOALS Name Target Date  Goal status  1 Pt will demonstrate a return to baseline cervical ROM measurements and not demonstrate any signs or symptoms of lymphedema. Baseline: 01/30/24 INITIAL  2 Pt will demonstrate 160 degrees of L shoulder flexion to allow him to reach overhead. 01/30/24 NEW  3 Pt will demonstrate 160 degrees of L shoulder abduction to allow him to reach out to the side. 01/30/24 NEW  4 Pt will report a 75% improvement in pain in L shoulder with UE movement to allow improved comfort. 01/30/24 NEW  5 Pt will be independent in a home exercise program for continued stretching and strengthening.  01/30/24 NEW       PLAN:  PT FREQUENCY/DURATION: 2x/wk for 4 wks  PLAN FOR NEXT SESSION: pulleys, ball, s/l L shoulder exercises, shoulder mobs   [facility]  [address], [address]  [place] [address]  [phone]   Scar massage You can begin gentle scar massage to you incision sites. Gently place one hand on the incision and move the skin (without sliding on the skin) in various directions. Do this for a few minutes and then you can gently massage either coconut oil or vitamin E cream into the scars.  Home exercise Program Continue doing the exercises you were given until you feel like you can do them without feeling any tightness at the end. It is best to do them for  several months after completion of radiation since the effects of radiation continue past completion.   Walking Program Studies show that 30 minutes of walking per day (fast enough to elevate your heart rate) can significantly reduce the risk of a cancer recurrence. If you can't walk due to other medical reasons, we encourage you to find another activity you could do (like a stationary bike or water exercise).  Posture After treatment for head and neck cancer, people frequently sit with rounded shoulders and forward head posture because the front of the neck has become tight and it feels better. If you sit like this, you can become very tight and have pain in sitting or standing with good posture. Try to be aware of your posture and sit and stand up tall to heal properly.  Follow up PT: Please let you doctor know as soon as possible if you develop any swelling in your face or neck in the future. Lymphedema (swelling) can occur months after completion of radiation. The sooner you can let the doctor know, the sooner they can refer you back to PT. It is much easier to treat the swelling early on.   [facility] [place], PT 01/02/2024, 1:06 PM

## 2024-01-03 ENCOUNTER — Inpatient Hospital Stay: Attending: Oncology

## 2024-01-03 ENCOUNTER — Inpatient Hospital Stay

## 2024-01-03 ENCOUNTER — Inpatient Hospital Stay (HOSPITAL_BASED_OUTPATIENT_CLINIC_OR_DEPARTMENT_OTHER): Admitting: Oncology

## 2024-01-03 ENCOUNTER — Encounter: Payer: Self-pay | Admitting: Oncology

## 2024-01-03 ENCOUNTER — Other Ambulatory Visit: Payer: Self-pay

## 2024-01-03 ENCOUNTER — Inpatient Hospital Stay: Admitting: Dietician

## 2024-01-03 ENCOUNTER — Encounter

## 2024-01-03 VITALS — BP 119/76 | HR 74 | Temp 98.6°F | Resp 17 | Ht 73.0 in | Wt 174.1 lb

## 2024-01-03 DIAGNOSIS — C4449 Other specified malignant neoplasm of skin of scalp and neck: Secondary | ICD-10-CM | POA: Diagnosis present

## 2024-01-03 DIAGNOSIS — Z5112 Encounter for antineoplastic immunotherapy: Secondary | ICD-10-CM | POA: Diagnosis present

## 2024-01-03 DIAGNOSIS — E785 Hyperlipidemia, unspecified: Secondary | ICD-10-CM | POA: Diagnosis not present

## 2024-01-03 DIAGNOSIS — Z9079 Acquired absence of other genital organ(s): Secondary | ICD-10-CM | POA: Insufficient documentation

## 2024-01-03 DIAGNOSIS — Z923 Personal history of irradiation: Secondary | ICD-10-CM | POA: Diagnosis not present

## 2024-01-03 DIAGNOSIS — C4490 Unspecified malignant neoplasm of skin, unspecified: Secondary | ICD-10-CM

## 2024-01-03 DIAGNOSIS — C44209 Unspecified malignant neoplasm of skin of left ear and external auricular canal: Secondary | ICD-10-CM

## 2024-01-03 DIAGNOSIS — Z8546 Personal history of malignant neoplasm of prostate: Secondary | ICD-10-CM | POA: Diagnosis not present

## 2024-01-03 DIAGNOSIS — Z79899 Other long term (current) drug therapy: Secondary | ICD-10-CM | POA: Insufficient documentation

## 2024-01-03 DIAGNOSIS — Z7982 Long term (current) use of aspirin: Secondary | ICD-10-CM | POA: Diagnosis not present

## 2024-01-03 DIAGNOSIS — I2510 Atherosclerotic heart disease of native coronary artery without angina pectoris: Secondary | ICD-10-CM | POA: Insufficient documentation

## 2024-01-03 LAB — CBC WITH DIFFERENTIAL (CANCER CENTER ONLY)
Abs Immature Granulocytes: 0.02 K/uL (ref 0.00–0.07)
Basophils Absolute: 0 K/uL (ref 0.0–0.1)
Basophils Relative: 0 %
Eosinophils Absolute: 0.1 K/uL (ref 0.0–0.5)
Eosinophils Relative: 2 %
HCT: 38.3 % — ABNORMAL LOW (ref 39.0–52.0)
Hemoglobin: 13.3 g/dL (ref 13.0–17.0)
Immature Granulocytes: 0 %
Lymphocytes Relative: 7 %
Lymphs Abs: 0.5 K/uL — ABNORMAL LOW (ref 0.7–4.0)
MCH: 30 pg (ref 26.0–34.0)
MCHC: 34.7 g/dL (ref 30.0–36.0)
MCV: 86.5 fL (ref 80.0–100.0)
Monocytes Absolute: 0.5 K/uL (ref 0.1–1.0)
Monocytes Relative: 7 %
Neutro Abs: 5.7 K/uL (ref 1.7–7.7)
Neutrophils Relative %: 84 %
Platelet Count: 202 K/uL (ref 150–400)
RBC: 4.43 MIL/uL (ref 4.22–5.81)
RDW: 12.8 % (ref 11.5–15.5)
WBC Count: 6.9 K/uL (ref 4.0–10.5)
nRBC: 0 % (ref 0.0–0.2)

## 2024-01-03 LAB — CMP (CANCER CENTER ONLY)
ALT: 25 U/L (ref 0–44)
AST: 27 U/L (ref 15–41)
Albumin: 4.1 g/dL (ref 3.5–5.0)
Alkaline Phosphatase: 45 U/L (ref 38–126)
Anion gap: 10 (ref 5–15)
BUN: 21 mg/dL (ref 8–23)
CO2: 26 mmol/L (ref 22–32)
Calcium: 9.4 mg/dL (ref 8.9–10.3)
Chloride: 105 mmol/L (ref 98–111)
Creatinine: 0.87 mg/dL (ref 0.61–1.24)
GFR, Estimated: >60 mL/min (ref >60)
Glucose, Bld: 92 mg/dL (ref 70–99)
Potassium: 3.9 mmol/L (ref 3.5–5.1)
Sodium: 141 mmol/L (ref 135–145)
Total Bilirubin: 0.8 mg/dL (ref 0.0–1.2)
Total Protein: 6 g/dL — ABNORMAL LOW (ref 6.5–8.1)

## 2024-01-03 LAB — TSH: TSH: 0.639 u[IU]/mL (ref 0.350–4.500)

## 2024-01-03 MED ORDER — SODIUM CHLORIDE 0.9 % IV SOLN: INTRAVENOUS | Status: DC

## 2024-01-03 MED ORDER — SODIUM CHLORIDE 0.9 % IV SOLN
350.0000 mg | Freq: Once | INTRAVENOUS | Status: AC
  Administered 2024-01-03: 350 mg via INTRAVENOUS
  Filled 2024-01-03: qty 7

## 2024-01-03 NOTE — Assessment & Plan Note (Addendum)
 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   Post-operative status with positive deep margin, necessitating adjuvant radiation therapy.   No evidence of metastasis, but considered locally advanced due to depth of involvement.   Completed adjuvant radiation treatments on 12/05/2023.  Immunotherapy with cemiplimab  proposed post-radiation to prevent recurrence and manage any residual disease.   Discussed the mechanism of action of cemiplimab  as an immunotherapy that unmasks cancer cells to the immune system. Potential side effects include immune-mediated organ damage, with thyroid  dysfunction being the most common. Severe side effects are rare, occurring in less than 0.5% of cases. The decision to use cemiplimab  is based on its efficacy in preventing recurrence in similar cases and its role as a standard of care in such scenarios. Immunotherapy expected to last 1-2 years, depending on response and tolerance. Aims to enhance immune system's ability to recognize and attack cancer cells. Potential side effects include autoimmune reactions, with thyroid  dysfunction being most common. Regular monitoring for side effects, managed with steroids and treatment breaks as needed.  Patient agreeable to proceed with treatment.  He is recovered from recent TURP procedure.  Labs today reveal no dose-limiting toxicities.  Will proceed with cemiplimab  initiation from today, 01/03/2024 and continue this every 3 weeks.  -Will consider restaging imaging (CT or PET scan) every three months to monitor for recurrence or metastasis.  RTC in 3 weeks for labs, office visit and continuation of Cemiplimab  treatments.

## 2024-01-03 NOTE — Patient Instructions (Signed)
 [facility] - A DEPT OF [facility]  Discharge Instructions: Thank you for choosing [facility] to provide your oncology and hematology care.   If you have a lab appointment with the Cancer Center, please go directly to the Cancer Center and check in at the registration area.   Wear comfortable clothing and clothing appropriate for easy access to any Portacath or PICC line.   We strive to give you quality time with your provider. You may need to reschedule your appointment if you arrive late (15 or more minutes).  Arriving late affects you and other patients whose appointments are after yours.  Also, if you miss three or more appointments without notifying the office, you may be dismissed from the clinic at the provider's discretion.      For prescription refill requests, have your pharmacy contact our office and allow 72 hours for refills to be completed.    Today you received the following chemotherapy and/or immunotherapy agents: Libtayo       To help prevent nausea and vomiting after your treatment, we encourage you to take your nausea medication as directed.  BELOW ARE SYMPTOMS THAT SHOULD BE REPORTED IMMEDIATELY: *FEVER GREATER THAN 100.4 F (38 C) OR HIGHER *CHILLS OR SWEATING *NAUSEA AND VOMITING THAT IS NOT CONTROLLED WITH YOUR NAUSEA MEDICATION *UNUSUAL SHORTNESS OF BREATH *UNUSUAL BRUISING OR BLEEDING *URINARY PROBLEMS (pain or burning when urinating, or frequent urination) *BOWEL PROBLEMS (unusual diarrhea, constipation, pain near the anus) TENDERNESS IN MOUTH AND THROAT WITH OR WITHOUT PRESENCE OF ULCERS (sore throat, sores in mouth, or a toothache) UNUSUAL RASH, SWELLING OR PAIN  UNUSUAL VAGINAL DISCHARGE OR ITCHING   Items with * indicate a potential emergency and should be followed up as soon as possible or go to the Emergency Department if any problems should occur.  Please show the CHEMOTHERAPY ALERT CARD or IMMUNOTHERAPY  ALERT CARD at check-in to the Emergency Department and triage nurse.  Should you have questions after your visit or need to cancel or reschedule your appointment, please contact [facility] - A DEPT OF [facility]  Dept: [phone]  and follow the prompts.  Office hours are 8:00 a.m. to 4:30 p.m. Monday - Friday. Please note that voicemails left after 4:00 p.m. may not be returned until the following business day.  We are closed weekends and major holidays. You have access to a nurse at all times for urgent questions. Please call the main number to the clinic Dept: [phone] and follow the prompts.   For any non-urgent questions, you may also contact your provider using MyChart. We now offer e-Visits for anyone 43 and older to request care online for non-urgent symptoms. For details visit [email or web address].   Also download the MyChart app! Go to the app store, search MyChart, open the app, select , and log in with your MyChart username and password.  Cemiplimab  Injection What is this medication? CEMIPLIMAB  (se MIP li mab) treats skin cancer and lung cancer. It works by helping your immune system slow or stop the spread of cancer cells. It is a monoclonal antibody. This medicine may be used for other purposes; ask your health care provider or pharmacist if you have questions. COMMON BRAND NAME(S): LIBTAYO  What should I tell my care team before I take this medication? They need to know if you have any of these conditions: Allogeneic stem cell transplant (uses someone else's stem cells)  Autoimmune diseases, such as Crohn disease, ulcerative colitis, lupus History of chest radiation Nervous system problems, such as Guillain-Barre syndrome or myasthenia gravis Organ transplant An unusual or allergic reaction to cemiplimab , other medications, foods, dyes, or preservatives Pregnant or trying to get pregnant Breastfeeding How should I use this  medication? This medication is infused into a vein. It is given by your care team in a hospital or clinic setting. A special MedGuide will be given to you before each treatment. Be sure to read this information carefully each time. Talk to your care team about the use of this medication in children. Special care may be needed. Overdosage: If you think you have taken too much of this medicine contact a poison control center or emergency room at once. NOTE: This medicine is only for you. Do not share this medicine with others. What if I miss a dose? Keep appointments for follow-up doses. It is important not to miss your dose. Call your care team if you are unable to keep an appointment. What may interact with this medication? Interactions have not been studied. This list may not describe all possible interactions. Give your health care provider a list of all the medicines, herbs, non-prescription drugs, or dietary supplements you use. Also tell them if you smoke, drink alcohol, or use illegal drugs. Some items may interact with your medicine. What should I watch for while using this medication? Visit your care team for regular checks on your progress. It may be some time before you see the benefit from this medication. You may need blood work done while taking this medication. This medication may cause serious skin reactions. They can happen weeks to months after starting the medication. Contact your care team right away if you notice fevers or flu-like symptoms with a rash. The rash may be red or purple and then turn into blisters or peeling of the skin. You may also notice a red rash with swelling of the face, lips, or lymph nodes in your neck or under your arms. Tell your care team right away if you have any change in your eyesight. Talk to your care team if you may be pregnant. You will need a negative pregnancy test before starting this medication. Contraception is recommended while taking this  medication and for 4 months after the last dose. Your care team can help you find the option that works for you. Do not breastfeed while taking this medication and for at least 4 months after the last dose. What side effects may I notice from receiving this medication? Side effects that you should report to your care team as soon as possible: Allergic reactions--skin rash, itching, hives, swelling of the face, lips, tongue, or throat Dry cough, shortness of breath or trouble breathing Eye pain, redness, irritation, or discharge with blurry or decreased vision Heart muscle inflammation--unusual weakness or fatigue, shortness of breath, chest pain, fast or irregular heartbeat, dizziness, swelling of the ankles, feet, or hands Hormone gland problems--headache, sensitivity to light, unusual weakness or fatigue, dizziness, fast or irregular heartbeat, increased sensitivity to cold or heat, excessive sweating, constipation, hair loss, increased thirst or amount of urine, tremors or shaking, irritability Infusion reactions--chest pain, shortness of breath or trouble breathing, feeling faint or lightheaded Kidney injury (glomerulonephritis)--decrease in the amount of urine, red or dark brown urine, foamy or bubbly urine, swelling of the ankles, hands, or feet Liver injury--right upper belly pain, loss of appetite, nausea, light-colored stool, dark yellow or brown urine, yellowing skin  or eyes, unusual weakness or fatigue Pain, tingling, or numbness in the hands or feet, muscle weakness, change in vision, confusion or trouble speaking, loss of balance or coordination, trouble walking, seizures Rash, fever, and swollen lymph nodes Redness, blistering, peeling, or loosening of the skin, including inside the mouth Sudden or severe stomach pain, bloody diarrhea, fever, nausea, vomiting Side effects that usually do not require medical attention (report these to your care team if they continue or are  bothersome): Bone, joint, or muscle pain Diarrhea Fatigue Loss of appetite Nausea Skin rash This list may not describe all possible side effects. Call your doctor for medical advice about side effects. You may report side effects to FDA at 1-800-FDA-1088. Where should I keep my medication? This medication is given in a hospital or clinic and will not be stored at home. NOTE: This sheet is a summary. It may not cover all possible information. If you have questions about this medicine, talk to your doctor, pharmacist, or health care provider.  2024 [employer]vier/Gold Standard (2022-03-21 00:00:00)

## 2024-01-03 NOTE — Progress Notes (Signed)
 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 Patient completed radiation to left parotid 10/21 under the care of Dr. Izell. Currently receiving Libtayo infusions q21d starting 11/19. Patient under the care of Dr. Autumn.   11/7-11/8 admission s/p TURP + cystolitholapaxy  Met with patient and wife in office. Patient completed first infusion prior to visit. Reports he did well and is feeling good. Patient reports taste and appetite continue to improve. Takes metaquil prior to po which he finds helpful. Yesterday had protein shake for breakfast (muscle milk + fruit blended), bowl of chili for lunch and bibipop (beef/rice/veggies) for dinner. Snacked on ginger snaps. Patient is not drinking as much water as he finds water with meals neutralizes his taste buds. Reports increased intake of cranberry and apple juices. Patient denies nausea, vomiting, diarrhea, constipation.    Medications: reviewed   Labs: reviewed   Anthropometrics: Wt 174 lb 1.6 oz today   10/27 - 177 lb    NUTRITION DIAGNOSIS: Unintended wt loss - continues    INTERVENTION:  Continue working to increase calories and protein to support healing s/p RT Continue daily protein shake    MONITORING, EVALUATION, GOAL: weight trends, intake   NEXT VISIT: Thursday December 11 during infusion

## 2024-01-03 NOTE — Progress Notes (Signed)
 [facility]  ONCOLOGY CLINIC PROGRESS NOTE   Patient Care Team: Patient, No Pcp Per as PCP - General (General Practice) Nahser, Aleene PARAS, MD (Inactive) as PCP - Cardiology (Cardiology) Izell Domino, MD as Attending Physician (Radiation Oncology) Autumn Millman, MD as Consulting Physician (Oncology) Jesus Oliphant, MD as Consulting Physician (Otolaryngology) Malmfelt, Delon CROME, RN as Oncology Nurse Navigator  PATIENT NAME: Charles Solis   MR#: [id number] DOB: [birth year]  Date of visit: 01/03/2024   ASSESSMENT & PLAN:   Charles Solis is a 73 y.o.  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   Post-operative status with positive deep margin, necessitating adjuvant radiation therapy.   No evidence of metastasis, but considered locally advanced due to depth of involvement.   Completed adjuvant radiation treatments on 12/05/2023.  Immunotherapy with cemiplimab  proposed post-radiation to prevent recurrence and manage any residual disease.   Discussed the mechanism of action of cemiplimab  as an immunotherapy that unmasks cancer cells to the immune system. Potential side effects include immune-mediated organ damage, with thyroid  dysfunction being the most common. Severe side effects are rare, occurring in less than 0.5% of cases. The decision to use cemiplimab  is based on its efficacy in preventing recurrence in similar cases and its role as a standard of care in such scenarios. Immunotherapy expected to last 1-2 years, depending on response and tolerance. Aims to enhance immune system's ability to recognize and attack cancer cells. Potential side effects include autoimmune reactions,  with thyroid  dysfunction being most common. Regular monitoring for side effects, managed with steroids and treatment breaks as needed.  Patient agreeable to proceed with treatment.  He is recovered from recent TURP procedure.  Labs today reveal no dose-limiting toxicities.  Will proceed with cemiplimab  initiation from today, 01/03/2024 and continue this every 3 weeks.  -Will consider restaging imaging (CT or PET scan) every three months to monitor for recurrence or metastasis.  RTC in 3 weeks for labs, office visit and continuation of Cemiplimab  treatments.  Transurethral Resection of the Prostate (TURP) Underwent TURP on December 22, 2023.  Recovering well from the procedure  I reviewed lab results and outside records for this visit and discussed relevant results with the patient. Diagnosis, plan of care and treatment options were also discussed in detail with the patient. Opportunity provided to ask questions and answers provided to his apparent satisfaction. Provided instructions to call our clinic with any problems, questions or concerns prior to return visit. I recommended to continue follow-up with PCP and sub-specialists. He verbalized understanding and agreed with the plan.   NCCN guidelines have been consulted in the planning of this patient's care.  I spent a total of 42 minutes during this encounter with the patient including review of chart and various tests results, discussions about plan of care and coordination of care plan.   Millman Autumn, MD  01/03/2024 5:33 PM  [facility] - A DEPT OF [facility] 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  Current Treatment: Received adjuvant radiation for positive deep margin.   Completed radiation treatments on 12/05/2023.  Given locally advanced nature, proposed adjuvant treatments with cemiplimab and patient is agreeable.  Started this from 01/03/2024.  INTERVAL HISTORY:    Discussed the use of AI scribe software for clinical note transcription with the patient, who gave verbal consent to proceed.  History of Present Illness  Charles Solis is a 73 year old male who presents for follow-up regarding post-surgical recovery and physical therapy for left shoulder issues.  He has regained his appetite and energy levels since his last visit over a month ago. He is currently undergoing physical therapy for pain in his left trapezius, attributed to damage from radiation and surgery affecting the function of his left shoulder blade. He attends therapy sessions twice a week for four weeks, which have made a significant difference, although issues persist.  He underwent prostate surgery on the seventh of an unspecified month and has recovered well from the procedure. Initially, he experienced hematuria and dysuria, but these symptoms have resolved.  No fevers, chills, or night sweats. Recent blood work was performed, including kidney and liver function tests, electrolytes, and blood counts.     I have reviewed the past medical history, past surgical history, social history and family history with the patient and they are unchanged from previous note.  HISTORY OF PRESENT ILLNESS:   ONCOLOGY HISTORY:   He has a history significant for prostate cancer and skin cancer with multiple recurrences. The most recent skin cancer removal was Mohs surgery to the left pinna about 3 years ago.    A few months ago, patient developed a lesion located on his left jaw area. Lesion has since continued increasing in size. To further investigate his symptoms, he underwent a CT neck on 07/18/23 showing an ovoid centrally hypodense lesion present posteriorly within or abutting the  superficial lobe of the left parotid, measuring approximately 22 x 17 x 18 mm.    In light of findings, he then underwent a fine-needle aspiration biopsy on 08/31/23. Pathology indicated the presence of malignant cells, carcinoma with squamous differentiation, with no lymphoid tissue identified.   Patient then presented for a second-opinion with Dr. Jesus on 09/04/23. Based on his recommendations, patient underwent a parotidectomy with radical neck dissection on 09/13/23. Surgical pathology redemonstrated tumor size of 3.5 x 3.2 x 2.3 cm  and histology of  invasive moderately differentiated squamous cell carcinoma with tumor present in dermis and infiltrates the subcutaneous adipose to superficially invade the underlying parotid gland. Tumor present in 3:00 margin but is negative for angiolymphatic and perineural invasion. Neck dissection pathology of 20 lymph nodes are negative for carcinoma.             Staging PET scan on 09/28/2023 showed left sided neck hypermetabolic some without well-defined correlate mass or adenopathy and is likely postoperative.  No evidence of extracervical suspicious activity.  Incidental findings include a 12 mm bladder stone and evidence of CAD and aortic atherosclerosis.  4 mm right lower lobe lung nodule, below PET resolution.   Positive deep margin, necessitating adjuvant radiation therapy.  Completed adjuvant radiation treatments on 12/05/2023.   No evidence of metastasis, but considered locally advanced due to depth of involvement.    Immunotherapy with cemiplimab proposed post-radiation to prevent recurrence and manage any residual disease.  Patient agreeable with this approach.  He underwent TURP for prostate issue on 12/22/2023.  Started cemiplimab treatments from 01/03/2024.  Oncology History  Skin cancer  10/04/2023 Initial Diagnosis   Skin cancer   10/04/2023 Cancer Staging   Staging form: Cutaneous Carcinoma of the Head and Neck, AJCC 8th Edition -  Pathologic stage from 10/04/2023: Stage IV (rpTX, rpN3b, rcM0) - Signed by Izell Domino, MD on 10/04/2023 Stage prefix: Recurrence Extraosseous extension: Unknown   01/03/2024 -  Chemotherapy   Patient is on Treatment Plan : ADVANCED CUTANEOUS SQUAMOUS CELL CARCINOMA Cemiplimab q21d         REVIEW OF SYSTEMS:   Review of Systems - Oncology  All other pertinent systems were reviewed with the patient and are negative.  ALLERGIES: He has no known allergies.  MEDICATIONS:  Current Outpatient Medications  Medication Sig Dispense Refill   aspirin EC 81 MG tablet Take 81 mg by mouth daily. Swallow whole.     cyanocobalamin  (VITAMIN B12) 1000 MCG tablet Take 1,000 mcg by mouth daily.     ezetimibe  (ZETIA ) 10 MG tablet TAKE ONE TABLET BY MOUTH EVERY DAY 90 tablet 3   fexofenadine (ALLEGRA) 180 MG tablet Take 180 mg by mouth daily.     finasteride  (PROSCAR ) 5 MG tablet Take 5 mg by mouth daily.     ipratropium (ATROVENT ) 0.03 % nasal spray Place 1 spray into both nostrils daily.     lidocaine -prilocaine (EMLA) cream Apply to affected area once 30 g 3   metoprolol  tartrate (LOPRESSOR ) 25 MG tablet Take 1 tablet (25 mg total) by mouth 2 (two) times daily. 180 tablet 1   ondansetron  (ZOFRAN ) 8 MG tablet Take 1 tablet (8 mg total) by mouth every 8 (eight) hours as needed for nausea or vomiting. 30 tablet 1   prochlorperazine (COMPAZINE) 10 MG tablet Take 1 tablet (10 mg total) by mouth every 6 (six) hours as needed for nausea or vomiting. 30 tablet 1   pseudoephedrine (SUDAFED) 30 MG tablet Take 30 mg by mouth every 4 (four) hours as needed for congestion.     rosuvastatin  (CRESTOR ) 20 MG tablet Take 1 tablet (20 mg total) by mouth daily. 90 tablet 3   No current facility-administered medications for this visit.     VITALS:   Blood pressure 119/76, pulse 74, temperature 98.6 F (37 C), temperature source Temporal, resp. rate 17, height 6' 1 (1.854 m), weight 174 lb 1.6 oz (79 kg), SpO2  100%.  Wt Readings from Last 3 Encounters:  01/03/24 174 lb 1.6 oz (79 kg)  12/25/23 179 lb 0.2 oz (81.2 kg)  12/22/23 179 lb (81.2 kg)    Body mass index is 22.97 kg/m.    Onc Performance Status - 01/03/24 0920       ECOG Perf Status   ECOG Perf Status Restricted in physically strenuous activity but ambulatory and able to carry out work of a light or sedentary nature, e.g., light house work, office work      KPS SCALE   KPS % SCORE Normal activity with effort, some s/s of disease          PHYSICAL EXAM:   Physical Exam Constitutional:      General: He is not in acute distress.    Appearance: Normal appearance.  HENT:     Head: Normocephalic and atraumatic.  Eyes:     Conjunctiva/sclera: Conjunctivae normal.  Neck:     Comments: Scars from  recent carcinoma excision on the left side of neck/parotid area are well-healed.   Mild radiation associated skin changes noted. Cardiovascular:  Rate and Rhythm: Normal rate and regular rhythm.  Pulmonary:     Effort: Pulmonary effort is normal. No respiratory distress.  Abdominal:     General: There is no distension.  Neurological:     General: No focal deficit present.     Mental Status: He is alert and oriented to person, place, and time.  Psychiatric:        Mood and Affect: Mood normal.        Behavior: Behavior normal.       LABORATORY DATA:   I have reviewed the data as listed.  Results for orders placed or performed in visit on 01/03/24  CBC with Differential (Cancer Center Only)  Result Value Ref Range   WBC Count 6.9 4.0 - 10.5 K/uL   RBC 4.43 4.22 - 5.81 MIL/uL   Hemoglobin 13.3 13.0 - 17.0 g/dL   HCT 61.6 (L) 60.9 - 47.9 %   MCV 86.5 80.0 - 100.0 fL   MCH 30.0 26.0 - 34.0 pg   MCHC 34.7 30.0 - 36.0 g/dL   RDW 87.1 88.4 - 84.4 %   Platelet Count 202 150 - 400 K/uL   nRBC 0.0 0.0 - 0.2 %   Neutrophils Relative % 84 %   Neutro Abs 5.7 1.7 - 7.7 K/uL   Lymphocytes Relative 7 %   Lymphs Abs 0.5  (L) 0.7 - 4.0 K/uL   Monocytes Relative 7 %   Monocytes Absolute 0.5 0.1 - 1.0 K/uL   Eosinophils Relative 2 %   Eosinophils Absolute 0.1 0.0 - 0.5 K/uL   Basophils Relative 0 %   Basophils Absolute 0.0 0.0 - 0.1 K/uL   Immature Granulocytes 0 %   Abs Immature Granulocytes 0.02 0.00 - 0.07 K/uL  CMP (Cancer Center only)  Result Value Ref Range   Sodium 141 135 - 145 mmol/L   Potassium 3.9 3.5 - 5.1 mmol/L   Chloride 105 98 - 111 mmol/L   CO2 26 22 - 32 mmol/L   Glucose, Bld 92 70 - 99 mg/dL   BUN 21 8 - 23 mg/dL   Creatinine 9.12 9.38 - 1.24 mg/dL   Calcium  9.4 8.9 - 10.3 mg/dL   Total Protein 6.0 (L) 6.5 - 8.1 g/dL   Albumin  4.1 3.5 - 5.0 g/dL   AST 27 15 - 41 U/L   ALT 25 0 - 44 U/L   Alkaline Phosphatase 45 38 - 126 U/L   Total Bilirubin 0.8 0.0 - 1.2 mg/dL   GFR, Estimated >39 >39 mL/min   Anion gap 10 5 - 15  TSH  Result Value Ref Range   TSH 0.639 0.350 - 4.500 uIU/mL      RADIOGRAPHIC STUDIES:  No recent pertinent imaging available to review.   CODE STATUS:  Code Status History     Date Active Date Inactive Code Status Order ID Comments User Context   09/13/2023 1247 09/14/2023 1436 Full Code [id number]  Jesus Oliphant, MD Inpatient    Questions for Most Recent Historical Code Status (Order [id number])     Question Answer   By: Other            Orders Placed This Encounter  Procedures   CBC with Differential (Cancer Center Only)    Standing Status:   Future    Expected Date:   03/06/2024    Expiration Date:   03/06/2025   CMP (Cancer Center only)    Standing Status:   Future    Expected Date:  03/06/2024    Expiration Date:   03/06/2025     Future Appointments  Date Time Provider Department Center  01/08/2024 10:00 AM Vale, Panaca L, PT [facility] None  01/10/2024 10:00 AM Aden Kins A, PTA [facility] None  01/16/2024 12:00 PM Lanis Carbon, Blaire L, PT [facility] None  01/19/2024 11:00 AM Aden Kins A, PTA [facility]  None  01/24/2024  2:45 PM [facility] FLUSH [facility] None  01/24/2024  3:15 PM Waseem Suess, MD [facility] None  01/25/2024  8:00 AM [facility] INFUSION [facility] None  01/25/2024  9:45 AM Ivonne Harlene RAMAN, RD [facility] None  01/25/2024 11:00 AM Lanis Carbon, Blaire L, PT [facility] None  01/29/2024 10:00 AM Aden Kins A, PTA [facility] None  01/31/2024 12:00 PM Aden Kins A, PTA [facility] None  01/31/2024  1:15 PM Jacelyn Lupita NOVAK, CCC-SLP [facility]  03/11/2024  1:30 PM [facility] 2 [facility] [place]  03/11/2024  2:00 PM [facility] 1 [facility] [place]  03/19/2024 10:00 AM Wyatt Leeroy HERO, PA-C [facility] None      This document was completed utilizing speech recognition software. Grammatical errors, random word insertions, pronoun errors, and incomplete sentences are an occasional consequence of this system due to software limitations, ambient noise, and hardware issues. Any formal questions or concerns about the content, text or information contained within the body of this dictation should be directly addressed to the provider for clarification.

## 2024-01-04 ENCOUNTER — Telehealth: Payer: Self-pay

## 2024-01-04 ENCOUNTER — Ambulatory Visit: Payer: Self-pay | Admitting: Cardiology

## 2024-01-04 LAB — T4: T4, Total: 6.7 ug/dL (ref 4.5–12.0)

## 2024-01-04 LAB — LIPID PANEL
Chol/HDL Ratio: 3.1 ratio (ref 0.0–5.0)
Cholesterol, Total: 106 mg/dL (ref 100–199)
HDL: 34 mg/dL — ABNORMAL LOW (ref >39)
LDL Chol Calc (NIH): 54 mg/dL (ref 0–99)
Triglycerides: 95 mg/dL (ref 0–149)
VLDL Cholesterol Cal: 18 mg/dL (ref 5–40)

## 2024-01-04 LAB — SPECIMEN STATUS REPORT

## 2024-01-04 NOTE — Telephone Encounter (Signed)
-----   Message from Nurse Ileana HERO sent at 01/03/2024 12:20 PM EST ----- Regarding: First time libtayo. pt of pasam First time libtayo. pt of pasam. Tolerated well. Please call to check in with pt when possible, thanks!

## 2024-01-04 NOTE — Telephone Encounter (Signed)
 LM for patient that this nurse was calling to see how they were doing after their treatment. Please call back to Dr. Zenda Alpers nurse at [phone] if they have any questions or concerns regarding the treatment.

## 2024-01-08 ENCOUNTER — Ambulatory Visit: Payer: Self-pay | Admitting: Rehabilitation

## 2024-01-08 ENCOUNTER — Encounter: Payer: Self-pay | Admitting: Rehabilitation

## 2024-01-08 DIAGNOSIS — C44209 Unspecified malignant neoplasm of skin of left ear and external auricular canal: Secondary | ICD-10-CM

## 2024-01-08 DIAGNOSIS — C4490 Unspecified malignant neoplasm of skin, unspecified: Secondary | ICD-10-CM | POA: Diagnosis not present

## 2024-01-08 DIAGNOSIS — M25512 Pain in left shoulder: Secondary | ICD-10-CM | POA: Diagnosis not present

## 2024-01-08 DIAGNOSIS — M25612 Stiffness of left shoulder, not elsewhere classified: Secondary | ICD-10-CM | POA: Diagnosis not present

## 2024-01-08 DIAGNOSIS — C770 Secondary and unspecified malignant neoplasm of lymph nodes of head, face and neck: Secondary | ICD-10-CM

## 2024-01-08 DIAGNOSIS — R293 Abnormal posture: Secondary | ICD-10-CM

## 2024-01-08 NOTE — Therapy (Signed)
 OUTPATIENT PHYSICAL THERAPY HEAD AND NECK POST RADIATION FOLLOW UP   Patient Name: Charles Solis MRN: [id number] DOB:Aug 13, 1950, 73 y.o., male Today's Date: 01/08/2024  END OF SESSION:  PT End of Session - 01/08/24 1149     Visit Number 3    Number of Visits 10    Date for Recertification  01/30/24    PT Start Time 1158    PT Stop Time 1252    PT Time Calculation (min) 54 min    Activity Tolerance Patient tolerated treatment well    Behavior During Therapy [facility] for tasks assessed/performed           Past Medical History:  Diagnosis Date   Arthritis    Cancer (HCC) 05/31/23   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   Coronary artery disease    High cholesterol    Hypothyroidism    Prostate cancer [facility])    Past Surgical History:  Procedure Laterality Date   COLONOSCOPY     CYSTOSCOPY WITH LITHOLAPAXY N/A 12/22/2023   Procedure: CYSTOSCOPY, WITH BLADDER CALCULUS LITHOLAPAXY;  Surgeon: Alvaro Ricardo KATHEE Mickey., MD;  Location: [facility]S;  Service: Urology;  Laterality: N/A;   ESOPHAGOGASTRODUODENOSCOPY ENDOSCOPY     HERNIA REPAIR Right    inguinal   IR IMAGING GUIDED PORT INSERTION  12/25/2023   MOHS SURGERY     head,ear,cheek,nose   OTOPLASATY Left 09/13/2023   Procedure: OTOPLASTY;  Surgeon: Jesus Oliphant, MD;  Location: [facility] OR;  Service: ENT;  Laterality: Left;  Left wedge resection of pinna with primary closure.   PAROTIDECTOMY Left 09/13/2023   Procedure: EXCISION, PAROTID GLAND;  Surgeon: Jesus Oliphant, MD;  Location: [facility] OR;  Service: ENT;  Laterality: Left;   RADICAL NECK DISSECTION Left 09/13/2023   Procedure: DISSECTION, NECK, RADICAL;  Surgeon: Jesus Oliphant, MD;  Location: [facility] OR;  Service: ENT;  Laterality: Left;   TONSILLECTOMY     TRANSURETHRAL RESECTION OF PROSTATE N/A 12/22/2023   Procedure: TURP (TRANSURETHRAL RESECTION OF PROSTATE);  Surgeon: Alvaro Ricardo KATHEE Mickey., MD;  Location: [facility];  Service: Urology;  Laterality: N/A;   Patient Active Problem List   Diagnosis Date  Noted   Prostate hypertrophy 12/22/2023   Cancer of skin of left ear 10/04/2023   Skin cancer 10/04/2023   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   Sensorineural hearing loss, bilateral 05/23/2023   HTN (hypertension) 11/16/2018   Hyperlipidemia 09/24/2014    PCP: None  REFERRING PROVIDER: Lauraine Golden, MD  REFERRING DIAG: C44.209 (ICD-10-CM) - Cancer of skin of left ear C44.90 (ICD-10-CM) - Skin cancer C77.0 (ICD-10-CM) - Secondary malignant neoplasm of cervical lymph node (HCC)  THERAPY DIAG:  Acute pain of left shoulder  Stiffness of left shoulder, not elsewhere classified  Abnormal posture  Skin cancer  Cancer of skin of left ear  Secondary malignant neoplasm of cervical lymph node (HCC)  Rationale for Evaluation and Treatment: Rehabilitation  ONSET DATE: 08/31/23  SUBJECTIVE:  SUBJECTIVE STATEMENT: Patient reports he is doing pretty well. He has been compliant with his HEP and that they are helping. He is still struggling to reach his left arm up though.   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 A few months ago he developed a lesion located on his left jaw area. Lesion had continued to increase in size. To investigate his symptoms he underwent a CT neck on 07/18/23 showing an ovoid centrally hypodense lesion present posteriorly within or abutting the superficial lobe of the left parotid, measuring approximately 22 x 17 x 18 mm. 08/31/23 He underwent a FNA. Pathology indicated the presence of malignant cells, carcinoma with squamous differentiation with no lymphoid tissue identified. 09/04/23 He presented to Dr. Jesus for second opinion. Based on his recommendations, patient underwent a parotidectomy with radical  neck dissection on 09/13/23. Surgical pathology redemonstrated tumor size of 3.5 x 3.2 x 2.3 cm and histology of invasive moderately differentiated squamous cell carcinoma with tumor present in dermis and infiltrates the subcutaneous adipose to superficially invade the underlying parotid gland. Tumor present in 3:00 margin but is negative for angiolymphatic and perineural invasion. Neck dissection pathology of 20 lymph nodes are negative for carcinoma. 09/28/23 PET showing no evidence of metastatic disease. 10/03/23 Consult with Dr. Izell. 10/06/23 Consult with Dr. Autumn. He will receive radiation then after it's completed immunotherapy with Libtayo  every 3 weeks for one year. He will receive 33 fractions of radiation to his left parotid which will start on 10/25/23 and will complete 12/11/23.   PATIENT GOALS:  Reassess how my recovery is going related to neck ROM, cervical pain, fatigue, and swelling.  PAIN:  Are you having pain? No pain currently but his incision site is tender when he tries to rub it  PRECAUTIONS: Recent radiation, Head and neck lymphedema risk,    OBJECTIVE:   POSTURE:  Forward head and rounded shoulders posture   30 SEC SIT TO STAND: 01/02/24: not done today because pt reports it caused knee pain the last time - he has to have cortisone shots in his knees At eval on 10/19/23: 13 reps in 30 sec without use of UEs which is  Good for patient's age   SHOULDER AROM:   At eval: WFL some discomfort in the L shoulder at end range, pt thinks he may have injured it lifting something heavy    UPPER EXTREMITY AROM/PROM:  A/PROM RIGHT   01/02/24  Shoulder extension 72  Shoulder flexion 161  Shoulder abduction 160  Shoulder internal rotation 55  Shoulder external rotation 88    (Blank rows = not tested)  A/PROM LEFT   01/02/24  Shoulder extension 71  Shoulder flexion 126  Shoulder abduction 55  Shoulder internal rotation Not tested due to pain  Shoulder external rotation Not  tested due to pain    (Blank rows = not tested)   CERVICAL AROM:     Percent limited Eval 10/19/23 01/02/24  Flexion WFL WFL  Extension WFL WFL  Right lateral flexion WFL 50% limited  Left lateral flexion 25% limited 50% limited  Right rotation WFL 25% limited  Left rotation 25% limited 35% limited                          (Blank rows=not tested)    LYMPHEDEMA ASSESSMENT:    Circumference in cm  4 cm superior to sternal notch around neck 39.1  6  cm superior to sternal notch around neck 38  8 cm superior to sternal notch around neck 38.2  R lateral nostril from base of nose to medial tragus   L lateral nostril from base of nose to medial tragus   R corner of mouth to where ear lobe meets face   L corner of mouth to where ear lobe meets face         (Blank rows=not tested)  OBSERVATION: L shoulder drooped compared to R, winging of L scapula, initially hiking of L scapula when attempting to raise UE, L scapula protracted  OTHER SYMPTOMS: Pain Yes Fibrosis No Pitting edema No Infections No Decreased scar mobility Yes  TREATMENT: 01/08/24: Therapeutic Activity: Pulleys flex & abd 2 min each - VC on proper technique Ball rolls up wall flex & abd x 10 each - no ball with abd due to increase difficulty with his balance L shoulder flexion & abduction AROM with pt in S/L x 8 each  S/L ER with 1 lb DB and towel under elbow x 10 - VC not to rotate trunk and arm for more ROM Neuromuscular Re-education: Standing row & ext with red theraband 2 x 10 reps - VC to keep shoulders relax and not compensating with UT Wall push-ups x 6 with VC for proper technique and placement of feet LT setting at wall x 10 - VC for proper technique  Manual therapy:  STM to UT/MT with patient S/L  01/02/24: Manual therapy: in R sidelying: L scapular mobilizations in all directions with increased tightness initially palpated but improved by end of session while educating pt in proper glenohumeral  rhythm and compensatory patterns Therapeutic Activity: In R sidelying: L shoulder flexion with therapist initially providing tacctile cues to keep scapular muscles engaged then pt able to do without tactile cues x 8 reps, L shoulder abduction with initial tactile cues but then pt able to engage, ER with 1 lb with towel under elbow x 10 In standing: row with red theraband x 20 reps with v/c   PATIENT EDUCATION:  Education details: sidelying exercises - see education section, proper glenohumeral rhythm, muscle memory, how neck dissection surgery effects nerves which effects how muscles function Person educated: Patient Education method: Explanation, Demonstration, Tactile cues, Verbal cues, and Handouts Education comprehension: verbalized understanding and returned demonstration  HOME EXERCISE PROGRAM: Access Code: [email or web address] URL: [email or web address] Date: 01/08/2024 Prepared by: Saddie Raw  Exercises - Sidelying Shoulder External Rotation with Dumbbell  - 1 x daily - 7 x weekly - 1-3 sets - 10 reps - Sidelying Shoulder Abduction Full Range of Motion  - 1 x daily - 7 x weekly - 1-3 sets - 10 reps - Sidelying Shoulder Flexion 15 Degrees  - 1 x daily - 7 x weekly - 1-3 sets - 10 reps - Standing Shoulder Row with Anchored Resistance  - 1 x daily - 7 x weekly - 1-3 sets - 10 reps - Seated Overhead Press  - 1 x daily - 7 x weekly - 1-3 sets - 10 reps - 2 hold - Wall Push Up  - 1 x daily - 7 x weekly - 1 sets - 10 reps - 20-30 seconds hold - Low Trap Setting at Wall  - 1 x daily - 7 x weekly - 1 sets - 10 reps - 2-3 sec hold  ASSESSMENT:  CLINICAL IMPRESSION: Pt demonstrates ease of motion and no resistance with passive scapular mobs in all directions and shoulder PROM. STM is provided  to the UT/MT due to the patient reporting continued muscular tightness on the Lt side. The patient tolerates the addition of exercises focused on strengthening the UT and scapular muscles. The  patient has scapular winging with resisted shoulder ER and shoulder flexion AROM thus ruling in spinal accessory nerve involvement. The patient's HEP is updated to include more scapular strengthening exercises. The patient has slowly started exercising again but reports global weakness; he was previously exercising regularly. Pt would benefit from skilled PT services to improve L shoulder ROM and strength, improve cervical ROM and decrease tightness and progress pt towards independence in a home exercise program.   Pt will benefit from skilled therapeutic intervention to improve on the following deficits: decreased knowledge of condition, decreased ROM, decreased strength, increased fascial restrictions, impaired UE functional use, postural dysfunction, and pain  PT treatment/interventions: ADL/Self care home management, [id number]- PT Re-evaluation, 97750- Physical Performance Testing, 97110-Therapeutic exercises, 97530- Therapeutic activity, 97112- Neuromuscular re-education, 97535- Self Care, [id number]- Manual therapy, 97760- Orthotic Initial, [id number]- Orthotic/Prosthetic subsequent, and Patient/Family education     GOALS: Goals reviewed with patient? Yes  LONG TERM GOALS:  (STG=LTG)  GOALS Name Target Date  Goal status  1 Pt will demonstrate a return to baseline cervical ROM measurements and not demonstrate any signs or symptoms of lymphedema. Baseline: 01/30/24 INITIAL  2 Pt will demonstrate 160 degrees of L shoulder flexion to allow him to reach overhead. 01/30/24 NEW  3 Pt will demonstrate 160 degrees of L shoulder abduction to allow him to reach out to the side. 01/30/24 NEW  4 Pt will report a 75% improvement in pain in L shoulder with UE movement to allow improved comfort. 01/30/24 NEW  5 Pt will be independent in a home exercise program for continued stretching and strengthening.  01/30/24 NEW       PLAN:  PT FREQUENCY/DURATION: 2x/wk for 4 wks  PLAN FOR NEXT SESSION: pulleys, ball, s/l  L shoulder exercises, shoulder mobs   [facility]  [address], [address]  [place] [address]  [phone]   Scar massage You can begin gentle scar massage to you incision sites. Gently place one hand on the incision and move the skin (without sliding on the skin) in various directions. Do this for a few minutes and then you can gently massage either coconut oil or vitamin E cream into the scars.  Home exercise Program Continue doing the exercises you were given until you feel like you can do them without feeling any tightness at the end. It is best to do them for several months after completion of radiation since the effects of radiation continue past completion.   Walking Program Studies show that 30 minutes of walking per day (fast enough to elevate your heart rate) can significantly reduce the risk of a cancer recurrence. If you can't walk due to other medical reasons, we encourage you to find another activity you could do (like a stationary bike or water  exercise).  Posture After treatment for head and neck cancer, people frequently sit with rounded shoulders and forward head posture because the front of the neck has become tight and it feels better. If you sit like this, you can become very tight and have pain in sitting or standing with good posture. Try to be aware of your posture and sit and stand up tall to heal properly.  Follow up PT: Please let you doctor know as soon as possible if you develop any swelling in your  face or neck in the future. Lymphedema (swelling) can occur months after completion of radiation. The sooner you can let the doctor know, the sooner they can refer you back to PT. It is much easier to treat the swelling early on.   Randall Pack, SPT  01/08/2024, 2:23 PM  I agree with the following treatment note after reviewing documentation. This session was performed under the supervision of a licensed clinician.  Saddie Raw,  PT 01/08/24, 2:24 PM

## 2024-01-10 ENCOUNTER — Ambulatory Visit

## 2024-01-10 DIAGNOSIS — M25612 Stiffness of left shoulder, not elsewhere classified: Secondary | ICD-10-CM

## 2024-01-10 DIAGNOSIS — R293 Abnormal posture: Secondary | ICD-10-CM | POA: Diagnosis not present

## 2024-01-10 DIAGNOSIS — C4490 Unspecified malignant neoplasm of skin, unspecified: Secondary | ICD-10-CM | POA: Diagnosis not present

## 2024-01-10 DIAGNOSIS — C44209 Unspecified malignant neoplasm of skin of left ear and external auricular canal: Secondary | ICD-10-CM

## 2024-01-10 DIAGNOSIS — M25512 Pain in left shoulder: Secondary | ICD-10-CM | POA: Diagnosis not present

## 2024-01-10 DIAGNOSIS — C770 Secondary and unspecified malignant neoplasm of lymph nodes of head, face and neck: Secondary | ICD-10-CM | POA: Diagnosis not present

## 2024-01-10 NOTE — Patient Instructions (Addendum)
 Charles Solis

## 2024-01-10 NOTE — Therapy (Signed)
 OUTPATIENT PHYSICAL THERAPY HEAD AND NECK CANCER TREATMENT   Patient Name: Charles Solis MRN: [id number] DOB:1950/07/15, 73 y.o., male Today's Date: 01/10/2024  END OF SESSION:  PT End of Session - 01/10/24 1008     Visit Number 4    Number of Visits 10    Date for Recertification  01/30/24    PT Start Time 1005    PT Stop Time 1101    PT Time Calculation (min) 56 min    Activity Tolerance Patient tolerated treatment well    Behavior During Therapy [facility] for tasks assessed/performed           Past Medical History:  Diagnosis Date   Arthritis    Cancer (HCC) 05/31/23   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   Coronary artery disease    High cholesterol    Hypothyroidism    Prostate cancer [facility])    Past Surgical History:  Procedure Laterality Date   COLONOSCOPY     CYSTOSCOPY WITH LITHOLAPAXY N/A 12/22/2023   Procedure: CYSTOSCOPY, WITH BLADDER CALCULUS LITHOLAPAXY;  Surgeon: Alvaro Ricardo KATHEE Mickey., MD;  Location: [facility] ORS;  Service: Urology;  Laterality: N/A;   ESOPHAGOGASTRODUODENOSCOPY ENDOSCOPY     HERNIA REPAIR Right    inguinal   IR IMAGING GUIDED PORT INSERTION  12/25/2023   MOHS SURGERY     head,ear,cheek,nose   OTOPLASATY Left 09/13/2023   Procedure: OTOPLASTY;  Surgeon: Jesus Oliphant, MD;  Location: [facility] OR;  Service: ENT;  Laterality: Left;  Left wedge resection of pinna with primary closure.   PAROTIDECTOMY Left 09/13/2023   Procedure: EXCISION, PAROTID GLAND;  Surgeon: Jesus Oliphant, MD;  Location: [facility] OR;  Service: ENT;  Laterality: Left;   RADICAL NECK DISSECTION Left 09/13/2023   Procedure: DISSECTION, NECK, RADICAL;  Surgeon: Jesus Oliphant, MD;  Location: [facility] OR;  Service: ENT;  Laterality: Left;   TONSILLECTOMY     TRANSURETHRAL RESECTION OF PROSTATE N/A 12/22/2023   Procedure: TURP (TRANSURETHRAL RESECTION OF PROSTATE);  Surgeon: Alvaro Ricardo KATHEE Mickey., MD;  Location: [facility];  Service: Urology;  Laterality: N/A;   Patient Active Problem List   Diagnosis Date Noted    Prostate hypertrophy 12/22/2023   Cancer of skin of left ear 10/04/2023   Skin cancer 10/04/2023   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   Sensorineural hearing loss, bilateral 05/23/2023   HTN (hypertension) 11/16/2018   Hyperlipidemia 09/24/2014    PCP: None  REFERRING PROVIDER: Lauraine Golden, MD  REFERRING DIAG: C44.209 (ICD-10-CM) - Cancer of skin of left ear C44.90 (ICD-10-CM) - Skin cancer C77.0 (ICD-10-CM) - Secondary malignant neoplasm of cervical lymph node (HCC)  THERAPY DIAG:  Acute pain of left shoulder  Stiffness of left shoulder, not elsewhere classified  Abnormal posture  Skin cancer  Cancer of skin of left ear  Secondary malignant neoplasm of cervical lymph node (HCC)  Rationale for Evaluation and Treatment: Rehabilitation  ONSET DATE: 08/31/23  SUBJECTIVE:  SUBJECTIVE STATEMENT: My Lt shoulder felt pretty stiff today when I woke up this morning so I did all my HEP. It's a lot but my shoulder felt better after.   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 A few months ago he developed a lesion located on his left jaw area. Lesion had continued to increase in size. To investigate his symptoms he underwent a CT neck on 07/18/23 showing an ovoid centrally hypodense lesion present posteriorly within or abutting the superficial lobe of the left parotid, measuring approximately 22 x 17 x 18 mm. 08/31/23 He underwent a FNA. Pathology indicated the presence of malignant cells, carcinoma with squamous differentiation with no lymphoid tissue identified. 09/04/23 He presented to Dr. Jesus for second opinion. Based on his recommendations, patient underwent a parotidectomy with radical neck dissection on 09/13/23.  Surgical pathology redemonstrated tumor size of 3.5 x 3.2 x 2.3 cm and histology of invasive moderately differentiated squamous cell carcinoma with tumor present in dermis and infiltrates the subcutaneous adipose to superficially invade the underlying parotid gland. Tumor present in 3:00 margin but is negative for angiolymphatic and perineural invasion. Neck dissection pathology of 20 lymph nodes are negative for carcinoma. 09/28/23 PET showing no evidence of metastatic disease. 10/03/23 Consult with Dr. Izell. 10/06/23 Consult with Dr. Autumn. He will receive radiation then after it's completed immunotherapy with Libtayo  every 3 weeks for one year. He will receive 33 fractions of radiation to his left parotid which will start on 10/25/23 and will complete 12/11/23.   PATIENT GOALS:  Reassess how my recovery is going related to neck ROM, cervical pain, fatigue, and swelling.  PAIN:  Are you having pain? No, shoulder is just stiff  PRECAUTIONS: Recent radiation, Head and neck lymphedema risk,    OBJECTIVE:   POSTURE:  Forward head and rounded shoulders posture   30 SEC SIT TO STAND: 01/02/24: not done today because pt reports it caused knee pain the last time - he has to have cortisone shots in his knees At eval on 10/19/23: 13 reps in 30 sec without use of UEs which is  Good for patient's age   SHOULDER AROM:   At eval: WFL some discomfort in the L shoulder at end range, pt thinks he may have injured it lifting something heavy    UPPER EXTREMITY AROM/PROM:  A/PROM RIGHT   01/02/24  Shoulder extension 72  Shoulder flexion 161  Shoulder abduction 160  Shoulder internal rotation 55  Shoulder external rotation 88    (Blank rows = not tested)  A/PROM LEFT   01/02/24  Shoulder extension 71  Shoulder flexion 126  Shoulder abduction 55  Shoulder internal rotation Not tested due to pain  Shoulder external rotation Not tested due to pain    (Blank rows = not tested)   CERVICAL AROM:      Percent limited Eval 10/19/23 01/02/24  Flexion WFL WFL  Extension WFL WFL  Right lateral flexion WFL 50% limited  Left lateral flexion 25% limited 50% limited  Right rotation WFL 25% limited  Left rotation 25% limited 35% limited                          (Blank rows=not tested)    LYMPHEDEMA ASSESSMENT:    Circumference in cm  4 cm superior to sternal notch around neck 39.1  6 cm superior to sternal notch around neck 38  8 cm superior  to sternal notch around neck 38.2  R lateral nostril from base of nose to medial tragus   L lateral nostril from base of nose to medial tragus   R corner of mouth to where ear lobe meets face   L corner of mouth to where ear lobe meets face         (Blank rows=not tested)  OBSERVATION: L shoulder drooped compared to R, winging of L scapula, initially hiking of L scapula when attempting to raise UE, L scapula protracted  OTHER SYMPTOMS: Pain Yes Fibrosis No Pitting edema No Infections No Decreased scar mobility Yes  TREATMENT: 01/10/24: Therapeutic Exercises: Pulleys flex & abd 2 min each - VC to remind pt to decrease Lt scapular compensation; educated pt during this time that he doesn't need to do all the HEP at one time, he can perform some in the morning and then the rest later. We also suggest stretching intermittently throughout the day, pt verbalized good understanding.  Ball rolls up wall flex & Lt abd x 10 each - focused on even weight bearing and pt was able to perform with ball today Neuro Muscular Re Ed Free Motion Machine: Standing on purple airex with core engaged for stability with scap retract x20 reps, and then bil UE ext with machine arms at setting 5, x 20 reps returning therapist demo for each Wall Push Ups 2 x 10, able to return correct technique when reminded  Therapeutic Activities Doorway Pectoralis Stretch x 3 reps, 20 sec holds returning demo Rt S/L for Lt er with 3# DB and towel under elbow 2 x 10 Rt S/L for Lt UE  abd with tactile assist for scapular depression by therapist throughout x 10 reps, instructed pt how his girlfriend could help stabilize like this at home Supine Scapular Series with yellow theraband: Bil UE er, bil UE horz abd, narrow and wide grip flexion, and then bil D2 returning therapist demo for each Manual Therapy  P/ROM briefly to Lt shoulder in supine into flex, abd and D2. Mild discomfort reported in UT, but mostly full end motions STM in Rt S/L to Lt UT with cocoa butter for trigger point release, pt has multiple latent trigger points here.   01/08/24: Therapeutic Activity: Pulleys flex & abd 2 min each - VC on proper technique Ball rolls up wall flex & abd x 10 each - no ball with abd due to increase difficulty with his balance L shoulder flexion & abduction AROM with pt in S/L x 8 each  S/L ER with 1 lb DB and towel under elbow x 10 - VC not to rotate trunk and arm for more ROM Neuromuscular Re-education: Standing row & ext with red theraband 2 x 10 reps - VC to keep shoulders relax and not compensating with UT Wall push-ups x 6 with VC for proper technique and placement of feet LT setting at wall x 10 - VC for proper technique  Manual therapy:  STM to UT/MT with patient S/L  01/02/24: Manual therapy: in R sidelying: L scapular mobilizations in all directions with increased tightness initially palpated but improved by end of session while educating pt in proper glenohumeral rhythm and compensatory patterns Therapeutic Activity: In R sidelying: L shoulder flexion with therapist initially providing tacctile cues to keep scapular muscles engaged then pt able to do without tactile cues x 8 reps, L shoulder abduction with initial tactile cues but then pt able to engage, ER with 1 lb with towel under elbow  x 10 In standing: row with red theraband x 20 reps with v/c   PATIENT EDUCATION:  Education details: supine scapular series with yellow theraband Person educated:  Patient Education method: Explanation, Demonstration, Tactile cues, Verbal cues, and Handouts Education comprehension: verbalized understanding and returned demonstration, will benefit from review  HOME EXERCISE PROGRAM: Access Code: [email or web address] URL: [email or web address] Date: 01/08/2024 Prepared by: Saddie Raw  Exercises - Sidelying Shoulder External Rotation with Dumbbell  - 1 x daily - 7 x weekly - 1-3 sets - 10 reps - Sidelying Shoulder Abduction Full Range of Motion  - 1 x daily - 7 x weekly - 1-3 sets - 10 reps - Sidelying Shoulder Flexion 15 Degrees  - 1 x daily - 7 x weekly - 1-3 sets - 10 reps - Standing Shoulder Row with Anchored Resistance  - 1 x daily - 7 x weekly - 1-3 sets - 10 reps - Seated Overhead Press  - 1 x daily - 7 x weekly - 1-3 sets - 10 reps - 2 hold - Wall Push Up  - 1 x daily - 7 x weekly - 1 sets - 10 reps - 20-30 seconds hold - Low Trap Setting at Wall  - 1 x daily - 7 x weekly - 1 sets - 10 reps - 2-3 sec hold  01/10/24 - Supine scapular series with yellow theraband  ASSESSMENT:  CLINICAL IMPRESSION: Continued with scapular stability exercises and progressed HEP with supine scapular series. Then continued with manual therapy working to decrease muscle tightness. Throughout session educated pt about importance of incorporating his stretches throughout his day and importance of continuing to be aware of his posture throughout day in sitting, supine and with ADLs. He verbalized good understanding.    Pt will benefit from skilled therapeutic intervention to improve on the following deficits: decreased knowledge of condition, decreased ROM, decreased strength, increased fascial restrictions, impaired UE functional use, postural dysfunction, and pain  PT treatment/interventions: ADL/Self care home management, [phone]- PT Re-evaluation, 97750- Physical Performance Testing, 97110-Therapeutic exercises, 97530- Therapeutic activity, [id number]- Neuromuscular  re-education, 97535- Self Care, [id number]- Manual therapy, [id number]- Orthotic Initial, [id number]- Orthotic/Prosthetic subsequent, and Patient/Family education     GOALS: Goals reviewed with patient? Yes  LONG TERM GOALS:  (STG=LTG)  GOALS Name Target Date  Goal status  1 Pt will demonstrate a return to baseline cervical ROM measurements and not demonstrate any signs or symptoms of lymphedema. Baseline: 01/30/24 INITIAL  2 Pt will demonstrate 160 degrees of L shoulder flexion to allow him to reach overhead. 01/30/24 NEW  3 Pt will demonstrate 160 degrees of L shoulder abduction to allow him to reach out to the side. 01/30/24 NEW  4 Pt will report a 75% improvement in pain in L shoulder with UE movement to allow improved comfort. 01/30/24 NEW  5 Pt will be independent in a home exercise program for continued stretching and strengthening.  01/30/24 NEW       PLAN:  PT FREQUENCY/DURATION: 2x/wk for 4 wks  PLAN FOR NEXT SESSION: pulleys, ball, s/l L shoulder exercises, shoulder mobs; review supine scapular series and cont scap stability   Berwyn Knights, PTA 01/10/24 11:51 AM  [facility]  [address], [address]  [place] [address]  [phone]  Over Head Pull: Narrow and Wide Grip   [facility] [phone]   On back, knees bent, feet flat, band across thighs, elbows straight but relaxed. Pull hands apart (start). Keeping elbows straight, bring arms up and over  head, hands toward floor. Keep pull steady on band. Hold momentarily. Return slowly, keeping pull steady, back to start. Then do same with a wider grip on the band (past shoulder width) Repeat _5-10__ times. Band color __yellow____   Side Pull: Double Arm   On back, knees bent, feet flat. Arms perpendicular to body, shoulder level, elbows straight but relaxed. Pull arms out to sides, elbows straight. Resistance band comes across collarbones, hands toward floor. Hold momentarily. Slowly return to  starting position. Repeat _5-10__ times. Band color _yellow____   Sword   On back, knees bent, feet flat, left hand on left hip, right hand above left. Pull right arm DIAGONALLY (hip to shoulder) across chest. Bring right arm along head toward floor. Hold momentarily. Slowly return to starting position. Repeat _5-10__ times. Do with left arm. Band color _yellow_____   Shoulder Rotation: Double Arm   On back, knees bent, feet flat, elbows tucked at sides, bent 90, hands palms up. Pull hands apart and down toward floor, keeping elbows near sides. Hold momentarily. Slowly return to starting position. Repeat _5-10__ times. Band color __yellow____

## 2024-01-15 ENCOUNTER — Telehealth: Payer: Self-pay | Admitting: Oncology

## 2024-01-15 ENCOUNTER — Telehealth: Payer: Self-pay

## 2024-01-15 ENCOUNTER — Encounter: Payer: Self-pay | Admitting: Oncology

## 2024-01-15 NOTE — Telephone Encounter (Signed)
 I spoke with patient on the phone per his request. He will be out of town on 12/10 so need to reschedule the appointment with Dr. Autumn along with port/lab appt. Also his infusion appt that was on the next day, 12/11 will need to be moved as well. He will make it to his Lymphedema appt on 12/11. Scheduler, Infusion Charge Nurse, Dr. Autumn, and Nutritionist all included in Secure Chat conversation.

## 2024-01-15 NOTE — Telephone Encounter (Signed)
 I spoke with patient to reschedule lab, MD, and infusion from 01/24/11/11 to 01/31/2024. Patient aware of date/time.

## 2024-01-16 ENCOUNTER — Ambulatory Visit: Attending: Radiation Oncology | Admitting: Physical Therapy

## 2024-01-16 ENCOUNTER — Encounter: Payer: Self-pay | Admitting: Physical Therapy

## 2024-01-16 DIAGNOSIS — C4490 Unspecified malignant neoplasm of skin, unspecified: Secondary | ICD-10-CM | POA: Insufficient documentation

## 2024-01-16 DIAGNOSIS — C770 Secondary and unspecified malignant neoplasm of lymph nodes of head, face and neck: Secondary | ICD-10-CM | POA: Diagnosis present

## 2024-01-16 DIAGNOSIS — R293 Abnormal posture: Secondary | ICD-10-CM | POA: Diagnosis present

## 2024-01-16 DIAGNOSIS — C44209 Unspecified malignant neoplasm of skin of left ear and external auricular canal: Secondary | ICD-10-CM | POA: Diagnosis present

## 2024-01-16 DIAGNOSIS — M25612 Stiffness of left shoulder, not elsewhere classified: Secondary | ICD-10-CM | POA: Diagnosis present

## 2024-01-16 DIAGNOSIS — M25512 Pain in left shoulder: Secondary | ICD-10-CM | POA: Diagnosis present

## 2024-01-16 NOTE — Therapy (Signed)
 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   Coronary artery disease    High cholesterol    Hypothyroidism    Prostate cancer [facility])    Past Surgical History:  Procedure Laterality Date   COLONOSCOPY     CYSTOSCOPY WITH LITHOLAPAXY N/A 12/22/2023   Procedure: CYSTOSCOPY, WITH BLADDER CALCULUS LITHOLAPAXY;  Surgeon: Alvaro Ricardo KATHEE Mickey., MD;  Location: [facility] ORS;  Service: Urology;  Laterality: N/A;   ESOPHAGOGASTRODUODENOSCOPY ENDOSCOPY     HERNIA REPAIR Right    inguinal   IR IMAGING GUIDED PORT INSERTION  12/25/2023   MOHS SURGERY     head,ear,cheek,nose   OTOPLASATY Left 09/13/2023   Procedure: OTOPLASTY;  Surgeon: Jesus Oliphant, MD;  Location: [facility] OR;  Service: ENT;  Laterality: Left;  Left wedge resection of pinna with primary closure.   PAROTIDECTOMY Left 09/13/2023   Procedure: EXCISION, PAROTID GLAND;  Surgeon: Jesus Oliphant, MD;  Location: [facility] OR;  Service: ENT;  Laterality: Left;   RADICAL NECK DISSECTION Left 09/13/2023   Procedure: DISSECTION, NECK, RADICAL;  Surgeon: Jesus Oliphant, MD;  Location: [facility] OR;  Service: ENT;  Laterality: Left;   TONSILLECTOMY     TRANSURETHRAL RESECTION OF PROSTATE N/A 12/22/2023   Procedure: TURP (TRANSURETHRAL RESECTION OF PROSTATE);  Surgeon: Alvaro Ricardo KATHEE Mickey., MD;  Location: [facility];  Service: Urology;  Laterality: N/A;   Patient Active Problem List   Diagnosis Date Noted    Prostate hypertrophy 12/22/2023   Cancer of skin of left ear 10/04/2023   Skin cancer 10/04/2023   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   Sensorineural hearing loss, bilateral 05/23/2023   HTN (hypertension) 11/16/2018   Hyperlipidemia 09/24/2014    PCP: None  REFERRING PROVIDER: Lauraine Golden, MD  REFERRING DIAG: C44.209 (ICD-10-CM) - Cancer of skin of left ear C44.90 (ICD-10-CM) - Skin cancer C77.0 (ICD-10-CM) - Secondary malignant neoplasm of cervical lymph node (HCC)  THERAPY DIAG:  Acute pain of left shoulder  Stiffness of left shoulder, not elsewhere classified  Abnormal posture  Skin cancer  Cancer of skin of left ear  Secondary malignant neoplasm of cervical lymph node (HCC)  Rationale for Evaluation and Treatment: Rehabilitation  ONSET DATE: 08/31/23  SUBJECTIVE:  SUBJECTIVE STATEMENT: My shoulder is doing a little better. I only have pain when I move it.   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 A few months ago he developed a lesion located on his left jaw area. Lesion had continued to increase in size. To investigate his symptoms he underwent a CT neck on 07/18/23 showing an ovoid centrally hypodense lesion present posteriorly within or abutting the superficial lobe of the left parotid, measuring approximately 22 x 17 x 18 mm. 08/31/23 He underwent a FNA. Pathology indicated the presence of malignant cells, carcinoma with squamous differentiation with no lymphoid tissue identified. 09/04/23 He presented to Dr. Jesus for second opinion. Based on his recommendations, patient underwent a parotidectomy with radical neck dissection on 09/13/23. Surgical pathology redemonstrated tumor size of 3.5 x 3.2 x 2.3 cm and  histology of invasive moderately differentiated squamous cell carcinoma with tumor present in dermis and infiltrates the subcutaneous adipose to superficially invade the underlying parotid gland. Tumor present in 3:00 margin but is negative for angiolymphatic and perineural invasion. Neck dissection pathology of 20 lymph nodes are negative for carcinoma. 09/28/23 PET showing no evidence of metastatic disease. 10/03/23 Consult with Dr. Izell. 10/06/23 Consult with Dr. Autumn. He will receive radiation then after it's completed immunotherapy with Libtayo  every 3 weeks for one year. He will receive 33 fractions of radiation to his left parotid which will start on 10/25/23 and will complete 12/11/23.   PATIENT GOALS:  Reassess how my recovery is going related to neck ROM, cervical pain, fatigue, and swelling.  PAIN:  Are you having pain? No, shoulder is just stiff, pain is a 3/10 with movement  PRECAUTIONS: Recent radiation, Head and neck lymphedema risk,    OBJECTIVE:   POSTURE:  Forward head and rounded shoulders posture   30 SEC SIT TO STAND: 01/02/24: not done today because pt reports it caused knee pain the last time - he has to have cortisone shots in his knees At eval on 10/19/23: 13 reps in 30 sec without use of UEs which is  Good for patient's age   SHOULDER AROM:   At eval: WFL some discomfort in the L shoulder at end range, pt thinks he may have injured it lifting something heavy    UPPER EXTREMITY AROM/PROM:  A/PROM RIGHT   01/02/24  Shoulder extension 72  Shoulder flexion 161  Shoulder abduction 160  Shoulder internal rotation 55  Shoulder external rotation 88    (Blank rows = not tested)  A/PROM LEFT   01/02/24  Shoulder extension 71  Shoulder flexion 126  Shoulder abduction 55  Shoulder internal rotation Not tested due to pain  Shoulder external rotation Not tested due to pain    (Blank rows = not tested)   CERVICAL AROM:     Percent limited Eval 10/19/23 01/02/24   Flexion WFL WFL  Extension WFL WFL  Right lateral flexion WFL 50% limited  Left lateral flexion 25% limited 50% limited  Right rotation WFL 25% limited  Left rotation 25% limited 35% limited                          (Blank rows=not tested)    LYMPHEDEMA ASSESSMENT:    Circumference in cm  4 cm superior to sternal notch around neck 39.1  6 cm superior to sternal notch around neck 38  8 cm superior to sternal notch around neck 38.2  R lateral nostril from base of nose to medial tragus   L lateral nostril from base of nose to medial tragus   R corner of mouth to where ear lobe meets face   L corner of mouth to where ear lobe meets face         (Blank rows=not tested)  OBSERVATION: L shoulder drooped compared to R, winging of L scapula, initially hiking of L scapula when attempting to raise UE, L scapula protracted  OTHER SYMPTOMS: Pain Yes Fibrosis No Pitting edema No Infections No Decreased scar mobility Yes  TREATMENT: 01/16/24: Therapeutic Exercises: Pulleys flex & abd 2 min each - VC to remind pt to decrease Lt scapular compensation  Ball rolls up wall flex & Lt abd x 10 each Neuro Muscular Re Ed Free Motion Machine: Standing on purple airex with core engaged for stability with scap retract 7lbs  x20 reps, and then bil UE ext with machine arms at setting 5 with 7lbs , x 20 reps returning therapist demo for each Wall Push Ups Plus x 10, pt return demonstrated Serratus scoops with extra light loop band wrapped around wrist with v/c and therapist demo for correct form x 10 reps Extra light loop band around wrist and forearms on wall walking up/down wall x 10 reps while keeping resistance on loop with v/c for correct form Therapeutic Activities Supine Scapular Series with red theraband: Bil UE ER, bil UE horz abd, narrow and wide grip flexion, and then bil D2 returning therapist demo for each x 10 each Manual Therapy  P/ROM briefly to Lt shoulder in supine into flex, abd  and D2. No discomfort noted today STM in Rt S/L to Lt UT with cocoa butter for trigger point release, pt has multiple latent trigger points here.    01/10/24: Therapeutic Exercises: Pulleys flex & abd 2 min each - VC to remind pt to decrease Lt scapular compensation; educated pt during this time that he doesn't need to do all the HEP at one time, he can perform some in the morning and then the rest later. We also suggest stretching intermittently throughout the day, pt verbalized good understanding.  Ball rolls up wall flex & Lt abd x 10 each - focused on even weight bearing and pt was able to perform with ball today Neuro Muscular Re Ed Free Motion Machine: Standing on purple airex with core engaged for stability with scap retract x20 reps, and then bil UE ext with machine arms at setting 5, x 20 reps returning therapist demo for each Wall Push Ups 2 x 10, able to return correct technique when reminded  Therapeutic Activities Doorway Pectoralis Stretch x 3 reps, 20 sec holds returning demo Rt S/L for Lt er with 3# DB and towel under elbow 2 x 10 Rt S/L for Lt UE abd with tactile assist for scapular depression by therapist throughout x 10 reps, instructed pt how his girlfriend could help stabilize like this at home Supine Scapular Series with yellow theraband: Bil UE er, bil UE horz abd, narrow and wide grip flexion, and then bil D2 returning therapist demo for each Manual Therapy  P/ROM briefly to Lt shoulder in supine into flex, abd and D2. Mild discomfort reported in UT, but mostly full end motions STM in Rt S/L to Lt UT with cocoa butter for trigger point release, pt has multiple latent trigger points here.   01/08/24: Therapeutic Activity: Pulleys flex & abd 2 min each - VC on proper technique [employer] [facility] [employer]  rolls up wall flex & abd x 10 each - no ball with abd due to increase difficulty with his balance L shoulder flexion & abduction AROM with pt in S/L x 8 each  S/L ER with 1 lb DB and towel  under elbow x 10 - VC not to rotate trunk and arm for more ROM Neuromuscular Re-education: Standing row & ext with red theraband 2 x 10 reps - VC to keep shoulders relax and not compensating with UT Wall push-ups x 6 with VC for proper technique and placement of feet LT setting at wall x 10 - VC for proper technique  Manual therapy:  STM to UT/MT with patient S/L  01/02/24: Manual therapy: in R sidelying: L scapular mobilizations in all directions with increased tightness initially palpated but improved by end of session while educating pt in proper glenohumeral rhythm and compensatory patterns Therapeutic Activity: In R sidelying: L shoulder flexion with therapist initially providing tacctile cues to keep scapular muscles engaged then pt able to do without tactile cues x 8 reps, L shoulder abduction with initial tactile cues but then pt able to engage, ER with 1 lb with towel under elbow x 10 In standing: row with red theraband x 20 reps with v/c   PATIENT EDUCATION:  Education details: supine scapular series with yellow theraband Person educated: Patient Education method: Explanation, Demonstration, Tactile cues, Verbal cues, and Handouts Education comprehension: verbalized understanding and returned demonstration, will benefit from review  HOME EXERCISE PROGRAM: Access Code: [email or web address] URL: [email or web address] Date: 01/08/2024 Prepared by: Saddie Raw  Exercises - Sidelying Shoulder External Rotation with Dumbbell  - 1 x daily - 7 x weekly - 1-3 sets - 10 reps - Sidelying Shoulder Abduction Full Range of Motion  - 1 x daily - 7 x weekly - 1-3 sets - 10 reps - Sidelying Shoulder Flexion 15 Degrees  - 1 x daily - 7 x weekly - 1-3 sets - 10 reps - Standing Shoulder Row with Anchored Resistance  - 1 x daily - 7 x weekly - 1-3 sets - 10 reps - Seated Overhead Press  - 1 x daily - 7 x weekly - 1-3 sets - 10 reps - 2 hold - Wall Push Up  - 1 x daily - 7 x weekly - 1 sets - 10  reps - 20-30 seconds hold - Low Trap Setting at Wall  - 1 x daily - 7 x weekly - 1 sets - 10 reps - 2-3 sec hold  01/10/24 - Supine scapular series with yellow theraband  ASSESSMENT:  CLINICAL IMPRESSION: Progressed to red band for supine scapular strengthening exercises with no increase in pain. Added new exercises for posture and scapular engagement. Pt has been compliant with his HEP. He still has difficulty with abduction with elbow bent but is seeing improvement with other motions.   Pt will benefit from skilled therapeutic intervention to improve on the following deficits: decreased knowledge of condition, decreased ROM, decreased strength, increased fascial restrictions, impaired UE functional use, postural dysfunction, and pain  PT treatment/interventions: ADL/Self care home management, [phone]- PT Re-evaluation, 97750- Physical Performance Testing, 97110-Therapeutic exercises, 97530- Therapeutic activity, [id number]- Neuromuscular re-education, 97535- Self Care, [id number]- Manual therapy, [id number]- Orthotic Initial, [id number]- Orthotic/Prosthetic subsequent, and Patient/Family education     GOALS: Goals reviewed with patient? Yes  LONG TERM GOALS:  (STG=LTG)  GOALS Name Target Date  Goal status  1 Pt will demonstrate a return to baseline cervical ROM measurements and not demonstrate any signs or  symptoms of lymphedema. Baseline: 01/30/24 INITIAL  2 Pt will demonstrate 160 degrees of L shoulder flexion to allow him to reach overhead. 01/30/24 NEW  3 Pt will demonstrate 160 degrees of L shoulder abduction to allow him to reach out to the side. 01/30/24 NEW  4 Pt will report a 75% improvement in pain in L shoulder with UE movement to allow improved comfort. 01/30/24 NEW  5 Pt will be independent in a home exercise program for continued stretching and strengthening.  01/30/24 NEW       PLAN:  PT FREQUENCY/DURATION: 2x/wk for 4 wks  PLAN FOR NEXT SESSION: pulleys, ball, s/l L shoulder  exercises, shoulder mobs; review supine scapular series and cont scap stability   Berwyn Knights, PTA 01/16/24 1:00 PM  [facility]  [address], [address]  [place] [address]  [phone]  Over Head Pull: Narrow and Wide Grip   [facility] [phone]   On back, knees bent, feet flat, band across thighs, elbows straight but relaxed. Pull hands apart (start). Keeping elbows straight, bring arms up and over head, hands toward floor. Keep pull steady on band. Hold momentarily. Return slowly, keeping pull steady, back to start. Then do same with a wider grip on the band (past shoulder width) Repeat _5-10__ times. Band color __yellow____   Side Pull: Double Arm   On back, knees bent, feet flat. Arms perpendicular to body, shoulder level, elbows straight but relaxed. Pull arms out to sides, elbows straight. Resistance band comes across collarbones, hands toward floor. Hold momentarily. Slowly return to starting position. Repeat _5-10__ times. Band color _yellow____   Sword   On back, knees bent, feet flat, left hand on left hip, right hand above left. Pull right arm DIAGONALLY (hip to shoulder) across chest. Bring right arm along head toward floor. Hold momentarily. Slowly return to starting position. Repeat _5-10__ times. Do with left arm. Band color _yellow_____   Shoulder Rotation: Double Arm   On back, knees bent, feet flat, elbows tucked at sides, bent 90, hands palms up. Pull hands apart and down toward floor, keeping elbows near sides. Hold momentarily. Slowly return to starting position. Repeat _5-10__ times. Band color __yellow____

## 2024-01-19 ENCOUNTER — Ambulatory Visit: Admitting: Rehabilitation

## 2024-01-19 ENCOUNTER — Encounter: Payer: Self-pay | Admitting: Rehabilitation

## 2024-01-19 DIAGNOSIS — R293 Abnormal posture: Secondary | ICD-10-CM

## 2024-01-19 DIAGNOSIS — M25612 Stiffness of left shoulder, not elsewhere classified: Secondary | ICD-10-CM

## 2024-01-19 DIAGNOSIS — C770 Secondary and unspecified malignant neoplasm of lymph nodes of head, face and neck: Secondary | ICD-10-CM

## 2024-01-19 DIAGNOSIS — C4490 Unspecified malignant neoplasm of skin, unspecified: Secondary | ICD-10-CM

## 2024-01-19 DIAGNOSIS — C44209 Unspecified malignant neoplasm of skin of left ear and external auricular canal: Secondary | ICD-10-CM

## 2024-01-19 DIAGNOSIS — M25512 Pain in left shoulder: Secondary | ICD-10-CM

## 2024-01-19 NOTE — Therapy (Signed)
 OUTPATIENT PHYSICAL THERAPY HEAD AND NECK CANCER TREATMENT   Patient Name: Charles Solis MRN: [id number] DOB:Nov 13, 1950, 73 y.o., male Today's Date: 01/19/2024  END OF SESSION:  PT End of Session - 01/19/24 1101     Visit Number 6    Number of Visits 10    Date for Recertification  01/30/24    PT Start Time 1100    PT Stop Time 1146    PT Time Calculation (min) 46 min    Activity Tolerance Patient tolerated treatment well    Behavior During Therapy [facility] for tasks assessed/performed           Past Medical History:  Diagnosis Date   Arthritis    Cancer (HCC) 05/31/23   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   Coronary artery disease    High cholesterol    Hypothyroidism    Prostate cancer [facility])    Past Surgical History:  Procedure Laterality Date   COLONOSCOPY     CYSTOSCOPY WITH LITHOLAPAXY N/A 12/22/2023   Procedure: CYSTOSCOPY, WITH BLADDER CALCULUS LITHOLAPAXY;  Surgeon: Alvaro Ricardo KATHEE Mickey., MD;  Location: [facility] ORS;  Service: Urology;  Laterality: N/A;   ESOPHAGOGASTRODUODENOSCOPY ENDOSCOPY     HERNIA REPAIR Right    inguinal   IR IMAGING GUIDED PORT INSERTION  12/25/2023   MOHS SURGERY     head,ear,cheek,nose   OTOPLASATY Left 09/13/2023   Procedure: OTOPLASTY;  Surgeon: Jesus Oliphant, MD;  Location[facility] OR;  Service: ENT;  Laterality: Left;  Left wedge resection of pinna with primary closure.   PAROTIDECTOMY Left 09/13/2023   Procedure: EXCISION, PAROTID GLAND;  Surgeon: Jesus Oliphant, MD;  Location: [facility] OR;  Service: ENT;  Laterality: Left;   RADICAL NECK DISSECTION Left 09/13/2023   Procedure: DISSECTION, NECK, RADICAL;  Surgeon: Jesus Oliphant, MD;  Location: [facility] OR;  Service: ENT;  Laterality: Left;   TONSILLECTOMY     TRANSURETHRAL RESECTION OF PROSTATE N/A 12/22/2023   Procedure: TURP (TRANSURETHRAL RESECTION OF PROSTATE);  Surgeon: Alvaro Ricardo KATHEE Mickey., MD;  Location: [facility];  Service: Urology;  Laterality: N/A;   Patient Active Problem List   Diagnosis Date Noted    Prostate hypertrophy 12/22/2023   Cancer of skin of left ear 10/04/2023   Skin cancer 10/04/2023   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   Sensorineural hearing loss, bilateral 05/23/2023   HTN (hypertension) 11/16/2018   Hyperlipidemia 09/24/2014    PCP: None  REFERRING PROVIDER: Lauraine Golden, MD  REFERRING DIAG: C44.209 (ICD-10-CM) - Cancer of skin of left ear C44.90 (ICD-10-CM) - Skin cancer C77.0 (ICD-10-CM) - Secondary malignant neoplasm of cervical lymph node (HCC)  THERAPY DIAG:  Acute pain of left shoulder  Stiffness of left shoulder, not elsewhere classified  Abnormal posture  Skin cancer  Cancer of skin of left ear  Secondary malignant neoplasm of cervical lymph node (HCC)  Rationale for Evaluation and Treatment: Rehabilitation  ONSET DATE: 08/31/23  SUBJECTIVE:  SUBJECTIVE STATEMENT: Patient reports he spent all day walking yesterday and noticed some soreness in the shoulder. He always has pain in the morning for a little bit but it goes away after a while.   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 A few months ago he developed a lesion located on his left jaw area. Lesion had continued to increase in size. To investigate his symptoms he underwent a CT neck on 07/18/23 showing an ovoid centrally hypodense lesion present posteriorly within or abutting the superficial lobe of the left parotid, measuring approximately 22 x 17 x 18 mm. 08/31/23 He underwent a FNA. Pathology indicated the presence of malignant cells, carcinoma with squamous differentiation with no lymphoid tissue identified. 09/04/23 He presented to Dr. Jesus for second opinion. Based on his recommendations, patient underwent a parotidectomy with  radical neck dissection on 09/13/23. Surgical pathology redemonstrated tumor size of 3.5 x 3.2 x 2.3 cm and histology of invasive moderately differentiated squamous cell carcinoma with tumor present in dermis and infiltrates the subcutaneous adipose to superficially invade the underlying parotid gland. Tumor present in 3:00 margin but is negative for angiolymphatic and perineural invasion. Neck dissection pathology of 20 lymph nodes are negative for carcinoma. 09/28/23 PET showing no evidence of metastatic disease. 10/03/23 Consult with Dr. Izell. 10/06/23 Consult with Dr. Autumn. He will receive radiation then after it's completed immunotherapy with Libtayo  every 3 weeks for one year. He will receive 33 fractions of radiation to his left parotid which will start on 10/25/23 and will complete 12/11/23.   PATIENT GOALS:  Reassess how my recovery is going related to neck ROM, cervical pain, fatigue, and swelling.  PAIN:  Are you having pain? No, just shoulder soreness   PRECAUTIONS: Recent radiation, Head and neck lymphedema risk,    OBJECTIVE:   POSTURE:  Forward head and rounded shoulders posture   30 SEC SIT TO STAND: 01/02/24: not done today because pt reports it caused knee pain the last time - he has to have cortisone shots in his knees At eval on 10/19/23: 13 reps in 30 sec without use of UEs which is  Good for patient's age   SHOULDER AROM:   At eval: WFL some discomfort in the L shoulder at end range, pt thinks he may have injured it lifting something heavy    UPPER EXTREMITY AROM/PROM:  A/PROM RIGHT   01/02/24  Shoulder extension 72  Shoulder flexion 161  Shoulder abduction 160  Shoulder internal rotation 55  Shoulder external rotation 88    (Blank rows = not tested)  A/PROM LEFT   01/02/24  Shoulder extension 71  Shoulder flexion 126  Shoulder abduction 55  Shoulder internal rotation Not tested due to pain  Shoulder external rotation Not tested due to pain    (Blank rows =  not tested)   CERVICAL AROM:     Percent limited Eval 10/19/23 01/02/24  Flexion WFL WFL  Extension WFL WFL  Right lateral flexion WFL 50% limited  Left lateral flexion 25% limited 50% limited  Right rotation WFL 25% limited  Left rotation 25% limited 35% limited                          (Blank rows=not tested)    LYMPHEDEMA ASSESSMENT:    Circumference in cm  4 cm superior to sternal notch around neck 39.1  6 cm superior to sternal notch around neck  38  8 cm superior to sternal notch around neck 38.2  R lateral nostril from base of nose to medial tragus   L lateral nostril from base of nose to medial tragus   R corner of mouth to where ear lobe meets face   L corner of mouth to where ear lobe meets face         (Blank rows=not tested)  OBSERVATION: L shoulder drooped compared to R, winging of L scapula, initially hiking of L scapula when attempting to raise UE, L scapula protracted  OTHER SYMPTOMS: Pain Yes Fibrosis No Pitting edema No Infections No Decreased scar mobility Yes  TREATMENT: 01/19/24: Therapeutic Exercises: Pulleys flex & abd 2 min each - VC to sit up tall and for slow, controlled motion rather than rushing through each motion Ball rolls up wall flex & Lt abd x 10 each Neuro Muscular Re Ed Free Motion Machine: Standing on purple airex with core engaged for stability with scap retract 7lbs 2 x0 reps, and then bil UE ext with machine arms at setting 5 with 7lbs , x 20  - VC to place weight on ball of foot rather than on his heels, SBA Wall Push Ups Plus x 10 LT setting at wall x 10 - D/C because it was too challenging but tolerates the addition of supine Y's with arm off plinth 2 x 5 Extra light loop band around wrist and forearms on wall walking up/down wall x 10 reps while keeping resistance on loop with v/c for correct form Therapeutic Activities Supine Scapular Series with red theraband: Bil UE ER, bil UE horz abd, narrow and wide grip flexion, and then  bil D2 returning therapist demo for each x 10 each - VC for slow, controlled motion and correct form Manual Therapy  P/ROM briefly to Lt shoulder in supine into flex, abd and D2   01/16/24: Therapeutic Exercises: Pulleys flex & abd 2 min each - VC to remind pt to decrease Lt scapular compensation  Ball rolls up wall flex & Lt abd x 10 each Neuro Muscular Re Ed Free Motion Machine: Standing on purple airex with core engaged for stability with scap retract 7lbs  x20 reps, and then bil UE ext with machine arms at setting 5 with 7lbs , x 20 reps returning therapist demo for each Wall Push Ups Plus x 10, pt return demonstrated Serratus scoops with extra light loop band wrapped around wrist with v/c and therapist demo for correct form x 10 reps Extra light loop band around wrist and forearms on wall walking up/down wall x 10 reps while keeping resistance on loop with v/c for correct form Therapeutic Activities Supine Scapular Series with red theraband: Bil UE ER, bil UE horz abd, narrow and wide grip flexion, and then bil D2 returning therapist demo for each x 10 each Manual Therapy  P/ROM briefly to Lt shoulder in supine into flex, abd and D2. No discomfort noted today STM in Rt S/L to Lt UT with cocoa butter for trigger point release, pt has multiple latent trigger points here.   01/10/24: Therapeutic Exercises: Pulleys flex & abd 2 min each - VC to remind pt to decrease Lt scapular compensation; educated pt during this time that he doesn't need to do all the HEP at one time, he can perform some in the morning and then the rest later. We also suggest stretching intermittently throughout the day, pt verbalized good understanding.  Ball rolls up wall flex & Lt abd  x 10 each - focused on even weight bearing and pt was able to perform with ball today Neuro Muscular Re Ed Free Motion Machine: Standing on purple airex with core engaged for stability with scap retract x20 reps, and then bil UE ext with  machine arms at setting 5, x 20 reps returning therapist demo for each Wall Push Ups 2 x 10, able to return correct technique when reminded  Therapeutic Activities Doorway Pectoralis Stretch x 3 reps, 20 sec holds returning demo Rt S/L for Lt er with 3# DB and towel under elbow 2 x 10 Rt S/L for Lt UE abd with tactile assist for scapular depression by therapist throughout x 10 reps, instructed pt how his girlfriend could help stabilize like this at home Supine Scapular Series with yellow theraband: Bil UE er, bil UE horz abd, narrow and wide grip flexion, and then bil D2 returning therapist demo for each Manual Therapy  P/ROM briefly to Lt shoulder in supine into flex, abd and D2. Mild discomfort reported in UT, but mostly full end motions STM in Rt S/L to Lt UT with cocoa butter for trigger point release, pt has multiple latent trigger points here.   PATIENT EDUCATION:  Education details: supine scapular series with yellow theraband Person educated: Patient Education method: Explanation, Demonstration, Tactile cues, Verbal cues, and Handouts Education comprehension: verbalized understanding and returned demonstration, will benefit from review  HOME EXERCISE PROGRAM: Access Code: [email or web address] URL: [email or web address] Date: 01/08/2024 Prepared by: Saddie Raw  Exercises - Sidelying Shoulder External Rotation with Dumbbell  - 1 x daily - 7 x weekly - 1-3 sets - 10 reps - Sidelying Shoulder Abduction Full Range of Motion  - 1 x daily - 7 x weekly - 1-3 sets - 10 reps - Sidelying Shoulder Flexion 15 Degrees  - 1 x daily - 7 x weekly - 1-3 sets - 10 reps - Standing Shoulder Row with Anchored Resistance  - 1 x daily - 7 x weekly - 1-3 sets - 10 reps - Seated Overhead Press  - 1 x daily - 7 x weekly - 1-3 sets - 10 reps - 2 hold - Wall Push Up  - 1 x daily - 7 x weekly - 1 sets - 10 reps - 20-30 seconds hold - Low Trap Setting at Wall  - 1 x daily - 7 x weekly - 1 sets - 10 reps -  2-3 sec hold  01/10/24 - Supine scapular series with yellow theraband  ASSESSMENT:  CLINICAL IMPRESSION: Patient tolerates the continuation of his POC with no increases in pain or discomfort reported. The patient requires verbal cueing throughout the session to perform all exercises in a slow, controlled manner rather than rushing through. Patient is unable to keep correct form with standing LT activation at the wall but tolerates the change to prone Y's off the plinth. The patient requires SBA for exercises on the foam pad at the Free Motion Machine due to challenges with his balace; VC is requires for patient to place his weigh through the balls of his feet rather than on his heels, with an improvement in balance noticed following the adjustment. The patient continues to have pain and difficulty at end range shoulder flexion and abduction while standing but can tolerate AAROM and gravity eliminated positions well. He would benefit from continued skilled physical therapy to address the impairments listed below.   Pt will benefit from skilled therapeutic intervention to improve on the following deficits: decreased knowledge of condition,  decreased ROM, decreased strength, increased fascial restrictions, impaired UE functional use, postural dysfunction, and pain  PT treatment/interventions: ADL/Self care home management, [phone]- PT Re-evaluation, 97750- Physical Performance Testing, 97110-Therapeutic exercises, 97530- Therapeutic activity, [id number]- Neuromuscular re-education, 97535- Self Care, [id number]- Manual therapy, 97760- Orthotic Initial, [id number]- Orthotic/Prosthetic subsequent, and Patient/Family education     GOALS: Goals reviewed with patient? Yes  LONG TERM GOALS:  (STG=LTG)  GOALS Name Target Date  Goal status  1 Pt will demonstrate a return to baseline cervical ROM measurements and not demonstrate any signs or symptoms of lymphedema. Baseline: 01/30/24 INITIAL  2 Pt will demonstrate 160  degrees of L shoulder flexion to allow him to reach overhead. 01/30/24 NEW  3 Pt will demonstrate 160 degrees of L shoulder abduction to allow him to reach out to the side. 01/30/24 NEW  4 Pt will report a 75% improvement in pain in L shoulder with UE movement to allow improved comfort. 01/30/24 NEW  5 Pt will be independent in a home exercise program for continued stretching and strengthening.  01/30/24 NEW       PLAN:  PT FREQUENCY/DURATION: 2x/wk for 4 wks  PLAN FOR NEXT SESSION: pulleys, ball, s/l L shoulder exercises, shoulder mobs; review supine scapular series and cont scap stability, continue prone Y's, add prone I's & T's    Randall Pack, SPT  01/19/24 11:49 AM  I agree with the following treatment note after reviewing documentation. This session was performed under the supervision of a licensed clinician.  Saddie Raw, PT 01/19/24, 11:56 AM    [facility]  [address], [address]  [place] [address]  [phone]  Over Head Pull: Narrow and Wide Grip   [facility] [phone]   On back, knees bent, feet flat, band across thighs, elbows straight but relaxed. Pull hands apart (start). Keeping elbows straight, bring arms up and over head, hands toward floor. Keep pull steady on band. Hold momentarily. Return slowly, keeping pull steady, back to start. Then do same with a wider grip on the band (past shoulder width) Repeat _5-10__ times. Band color __yellow____   Side Pull: Double Arm   On back, knees bent, feet flat. Arms perpendicular to body, shoulder level, elbows straight but relaxed. Pull arms out to sides, elbows straight. Resistance band comes across collarbones, hands toward floor. Hold momentarily. Slowly return to starting position. Repeat _5-10__ times. Band color _yellow____   Sword   On back, knees bent, feet flat, left hand on left hip, right hand above left. Pull right arm DIAGONALLY (hip to shoulder) across chest. Bring right arm  along head toward floor. Hold momentarily. Slowly return to starting position. Repeat _5-10__ times. Do with left arm. Band color _yellow_____   Shoulder Rotation: Double Arm   On back, knees bent, feet flat, elbows tucked at sides, bent 90, hands palms up. Pull hands apart and down toward floor, keeping elbows near sides. Hold momentarily. Slowly return to starting position. Repeat _5-10__ times. Band color __yellow____

## 2024-01-24 ENCOUNTER — Inpatient Hospital Stay

## 2024-01-24 ENCOUNTER — Inpatient Hospital Stay: Admitting: Oncology

## 2024-01-25 ENCOUNTER — Ambulatory Visit: Admitting: Physical Therapy

## 2024-01-25 ENCOUNTER — Encounter: Payer: Self-pay | Admitting: Physical Therapy

## 2024-01-25 ENCOUNTER — Inpatient Hospital Stay: Attending: Oncology | Admitting: Dietician

## 2024-01-25 ENCOUNTER — Inpatient Hospital Stay

## 2024-01-25 DIAGNOSIS — C4490 Unspecified malignant neoplasm of skin, unspecified: Secondary | ICD-10-CM

## 2024-01-25 DIAGNOSIS — Z7982 Long term (current) use of aspirin: Secondary | ICD-10-CM | POA: Insufficient documentation

## 2024-01-25 DIAGNOSIS — Z923 Personal history of irradiation: Secondary | ICD-10-CM | POA: Insufficient documentation

## 2024-01-25 DIAGNOSIS — M25612 Stiffness of left shoulder, not elsewhere classified: Secondary | ICD-10-CM

## 2024-01-25 DIAGNOSIS — M25512 Pain in left shoulder: Secondary | ICD-10-CM

## 2024-01-25 DIAGNOSIS — C770 Secondary and unspecified malignant neoplasm of lymph nodes of head, face and neck: Secondary | ICD-10-CM

## 2024-01-25 DIAGNOSIS — E860 Dehydration: Secondary | ICD-10-CM | POA: Insufficient documentation

## 2024-01-25 DIAGNOSIS — C44209 Unspecified malignant neoplasm of skin of left ear and external auricular canal: Secondary | ICD-10-CM

## 2024-01-25 DIAGNOSIS — C4449 Other specified malignant neoplasm of skin of scalp and neck: Secondary | ICD-10-CM | POA: Insufficient documentation

## 2024-01-25 DIAGNOSIS — Z8546 Personal history of malignant neoplasm of prostate: Secondary | ICD-10-CM | POA: Insufficient documentation

## 2024-01-25 DIAGNOSIS — Z79899 Other long term (current) drug therapy: Secondary | ICD-10-CM | POA: Insufficient documentation

## 2024-01-25 DIAGNOSIS — R293 Abnormal posture: Secondary | ICD-10-CM

## 2024-01-25 NOTE — Therapy (Signed)
 OUTPATIENT PHYSICAL THERAPY HEAD AND NECK CANCER TREATMENT   Patient Name: Charles Solis MRN: [id number] DOB:May 06, 1950, 73 y.o., male Today's Date: 01/25/2024  END OF SESSION:  PT End of Session - 01/25/24 1104     Visit Number 7    Number of Visits 10    Date for Recertification  01/30/24    PT Start Time 1102    PT Stop Time 1146    PT Time Calculation (min) 44 min    Activity Tolerance Patient tolerated treatment well    Behavior During Therapy [facility] for tasks assessed/performed           Past Medical History:  Diagnosis Date   Arthritis    Cancer (HCC) 05/31/23   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   Coronary artery disease    High cholesterol    Hypothyroidism    Prostate cancer [facility])    Past Surgical History:  Procedure Laterality Date   COLONOSCOPY     CYSTOSCOPY WITH LITHOLAPAXY N/A 12/22/2023   Procedure: CYSTOSCOPY, WITH BLADDER CALCULUS LITHOLAPAXY;  Surgeon: Alvaro Ricardo KATHEE Mickey., MD;  Location: [facility] ORS;  Service: Urology;  Laterality: N/A;   ESOPHAGOGASTRODUODENOSCOPY ENDOSCOPY     HERNIA REPAIR Right    inguinal   IR IMAGING GUIDED PORT INSERTION  12/25/2023   MOHS SURGERY     head,ear,cheek,nose   OTOPLASATY Left 09/13/2023   Procedure: OTOPLASTY;  Surgeon: Jesus Oliphant, MD;  Location: [facility] OR;  Service: ENT;  Laterality: Left;  Left wedge resection of pinna with primary closure.   PAROTIDECTOMY Left 09/13/2023   Procedure: EXCISION, PAROTID GLAND;  Surgeon: Jesus Oliphant, MD;  Location: [facility] OR;  Service: ENT;  Laterality: Left;   RADICAL NECK DISSECTION Left 09/13/2023   Procedure: DISSECTION, NECK, RADICAL;  Surgeon: Jesus Oliphant, MD;  Location: [facility] OR;  Service: ENT;  Laterality: Left;   TONSILLECTOMY     TRANSURETHRAL RESECTION OF PROSTATE N/A 12/22/2023   Procedure: TURP (TRANSURETHRAL RESECTION OF PROSTATE);  Surgeon: Alvaro Ricardo KATHEE Mickey., MD;  Location: [facility] ORS;  Service: Urology;  Laterality: N/A;   Patient Active Problem List   Diagnosis Date Noted    Prostate hypertrophy 12/22/2023   Cancer of skin of left ear 10/04/2023   Skin cancer 10/04/2023   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   Sensorineural hearing loss, bilateral 05/23/2023   HTN (hypertension) 11/16/2018   Hyperlipidemia 09/24/2014    PCP: None  REFERRING PROVIDER: Lauraine Golden, MD  REFERRING DIAG: C44.209 (ICD-10-CM) - Cancer of skin of left ear C44.90 (ICD-10-CM) - Skin cancer C77.0 (ICD-10-CM) - Secondary malignant neoplasm of cervical lymph node (HCC)  THERAPY DIAG:  Acute pain of left shoulder  Stiffness of left shoulder, not elsewhere classified  Abnormal posture  Skin cancer  Cancer of skin of left ear  Secondary malignant neoplasm of cervical lymph node (HCC)  Rationale for Evaluation and Treatment: Rehabilitation  ONSET DATE: 08/31/23  SUBJECTIVE:  SUBJECTIVE STATEMENT: I am learning how to do certain movements. I do part of the exercises in the morning and part in the evening.   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 A few months ago he developed a lesion located on his left jaw area. Lesion had continued to increase in size. To investigate his symptoms he underwent a CT neck on 07/18/23 showing an ovoid centrally hypodense lesion present posteriorly within or abutting the superficial lobe of the left parotid, measuring approximately 22 x 17 x 18 mm. 08/31/23 He underwent a FNA. Pathology indicated the presence of malignant cells, carcinoma with squamous differentiation with no lymphoid tissue identified. 09/04/23 He presented to Dr. Jesus for second opinion. Based on his recommendations, patient underwent a parotidectomy with radical neck dissection on 09/13/23. Surgical pathology  redemonstrated tumor size of 3.5 x 3.2 x 2.3 cm and histology of invasive moderately differentiated squamous cell carcinoma with tumor present in dermis and infiltrates the subcutaneous adipose to superficially invade the underlying parotid gland. Tumor present in 3:00 margin but is negative for angiolymphatic and perineural invasion. Neck dissection pathology of 20 lymph nodes are negative for carcinoma. 09/28/23 PET showing no evidence of metastatic disease. 10/03/23 Consult with Dr. Izell. 10/06/23 Consult with Dr. Autumn. He will receive radiation then after it's completed immunotherapy with Libtayo  every 3 weeks for one year. He will receive 33 fractions of radiation to his left parotid which will start on 10/25/23 and will complete 12/11/23.   PATIENT GOALS:  Reassess how my recovery is going related to neck ROM, cervical pain, fatigue, and swelling.  PAIN:  Are you having pain? No, just shoulder soreness   PRECAUTIONS: Recent radiation, Head and neck lymphedema risk,    OBJECTIVE:   POSTURE:  Forward head and rounded shoulders posture   30 SEC SIT TO STAND: 01/02/24: not done today because pt reports it caused knee pain the last time - he has to have cortisone shots in his knees At eval on 10/19/23: 13 reps in 30 sec without use of UEs which is  Good for patient's age   SHOULDER AROM:   At eval: WFL some discomfort in the L shoulder at end range, pt thinks he may have injured it lifting something heavy    UPPER EXTREMITY AROM/PROM:  A/PROM RIGHT   01/02/24  Shoulder extension 72  Shoulder flexion 161  Shoulder abduction 160  Shoulder internal rotation 55  Shoulder external rotation 88    (Blank rows = not tested)  A/PROM LEFT   01/02/24 LEFT 01/25/24  Shoulder extension 71 64  Shoulder flexion 126 125  Shoulder abduction 55 64  Shoulder internal rotation Not tested due to pain   Shoulder external rotation Not tested due to pain     (Blank rows = not tested)   CERVICAL  AROM:     Percent limited Eval 10/19/23 01/02/24  Flexion WFL WFL  Extension WFL WFL  Right lateral flexion WFL 50% limited  Left lateral flexion 25% limited 50% limited  Right rotation WFL 25% limited  Left rotation 25% limited 35% limited                          (Blank rows=not tested)    LYMPHEDEMA ASSESSMENT:    Circumference in cm  4 cm superior to sternal notch around neck 39.1  6 cm superior to sternal notch around neck 38  8 cm  superior to sternal notch around neck 38.2  R lateral nostril from base of nose to medial tragus   L lateral nostril from base of nose to medial tragus   R corner of mouth to where ear lobe meets face   L corner of mouth to where ear lobe meets face         (Blank rows=not tested)  OBSERVATION: L shoulder drooped compared to R, winging of L scapula, initially hiking of L scapula when attempting to raise UE, L scapula protracted  OTHER SYMPTOMS: Pain Yes Fibrosis No Pitting edema No Infections No Decreased scar mobility Yes  TREATMENT: 01/25/24: Remeasured shoulder ROM Therapeutic Exercises: Pulleys flex & abd 2 min each - VC to sit up tall and for slow, controlled motion rather than rushing through each motion Ball rolls up wall flex & Lt abd x 10 each Neuro Muscular Re Ed Free Motion Machine: Standing on purple airex with core engaged for stability with scap retract 7lbs 2 x 10 reps, and then bil UE ext with machine arms at setting 5 with 7lbs , x 20, SBA Wall Push Ups Plus x 10 Extra light loop band around wrist and forearms on wall walking up/down wall x 10 reps while keeping resistance on loop with v/c for correct form On hands and knees with feet raised: with L UE on towel sliding forward, in to scaption and then in to extension x 5 reps each Serratus scoops with yellow (medium) resistance loop around wrists with pt returning therapist demo x 10 reps Sidelying L shoulder flexion x 10 reps with therapist manually stabilizing scapula  while educating pt to engage muscles with pt able to do last 3 reps without no manual assist demonstrating improved range and form then did the same with abduction  Therapeutic Activities Supine Scapular Series with red theraband: Bil UE ER, bil UE horz abd, narrow and wide grip flexion, and then bil D2 returning therapist demo for each x 10 each   01/19/24: Therapeutic Exercises: Pulleys flex & abd 2 min each - VC to sit up tall and for slow, controlled motion rather than rushing through each motion Ball rolls up wall flex & Lt abd x 10 each Neuro Muscular Re Ed Free Motion Machine: Standing on purple airex with core engaged for stability with scap retract 7lbs 2 x0 reps, and then bil UE ext with machine arms at setting 5 with 7lbs , x 20  - VC to place weight on ball of foot rather than on his heels, SBA Wall Push Ups Plus x 10 LT setting at wall x 10 - D/C because it was too challenging but tolerates the addition of supine Y's with arm off plinth 2 x 5 Extra light loop band around wrist and forearms on wall walking up/down wall x 10 reps while keeping resistance on loop with v/c for correct form Therapeutic Activities Supine Scapular Series with red theraband: Bil UE ER, bil UE horz abd, narrow and wide grip flexion, and then bil D2 returning therapist demo for each x 10 each - VC for slow, controlled motion and correct form Manual Therapy  P/ROM briefly to Lt shoulder in supine into flex, abd and D2   01/16/24: Therapeutic Exercises: Pulleys flex & abd 2 min each - VC to remind pt to decrease Lt scapular compensation  Ball rolls up wall flex & Lt abd x 10 each Neuro Muscular Re Ed Free Motion Machine: Standing on purple airex with core engaged for stability  with scap retract 7lbs  x20 reps, and then bil UE ext with machine arms at setting 5 with 7lbs , x 20 reps returning therapist demo for each Wall Push Ups Plus x 10, pt return demonstrated Serratus scoops with extra light loop band  wrapped around wrist with v/c and therapist demo for correct form x 10 reps Extra light loop band around wrist and forearms on wall walking up/down wall x 10 reps while keeping resistance on loop with v/c for correct form Therapeutic Activities Supine Scapular Series with red theraband: Bil UE ER, bil UE horz abd, narrow and wide grip flexion, and then bil D2 returning therapist demo for each x 10 each Manual Therapy  P/ROM briefly to Lt shoulder in supine into flex, abd and D2. No discomfort noted today STM in Rt S/L to Lt UT with cocoa butter for trigger point release, pt has multiple latent trigger points here.   01/10/24: Therapeutic Exercises: Pulleys flex & abd 2 min each - VC to remind pt to decrease Lt scapular compensation; educated pt during this time that he doesn't need to do all the HEP at one time, he can perform some in the morning and then the rest later. We also suggest stretching intermittently throughout the day, pt verbalized good understanding.  Ball rolls up wall flex & Lt abd x 10 each - focused on even weight bearing and pt was able to perform with ball today Neuro Muscular Re Ed Free Motion Machine: Standing on purple airex with core engaged for stability with scap retract x20 reps, and then bil UE ext with machine arms at setting 5, x 20 reps returning therapist demo for each Wall Push Ups 2 x 10, able to return correct technique when reminded  Therapeutic Activities Doorway Pectoralis Stretch x 3 reps, 20 sec holds returning demo Rt S/L for Lt er with 3# DB and towel under elbow 2 x 10 Rt S/L for Lt UE abd with tactile assist for scapular depression by therapist throughout x 10 reps, instructed pt how his girlfriend could help stabilize like this at home Supine Scapular Series with yellow theraband: Bil UE er, bil UE horz abd, narrow and wide grip flexion, and then bil D2 returning therapist demo for each Manual Therapy  P/ROM briefly to Lt shoulder in supine into  flex, abd and D2. Mild discomfort reported in UT, but mostly full end motions STM in Rt S/L to Lt UT with cocoa butter for trigger point release, pt has multiple latent trigger points here.   PATIENT EDUCATION:  Education details: supine scapular series with yellow theraband Person educated: Patient Education method: Explanation, Demonstration, Tactile cues, Verbal cues, and Handouts Education comprehension: verbalized understanding and returned demonstration, will benefit from review  HOME EXERCISE PROGRAM: Access Code: [email or web address] URL: [email or web address] Date: 01/08/2024 Prepared by: Saddie Raw  Exercises - Sidelying Shoulder External Rotation with Dumbbell  - 1 x daily - 7 x weekly - 1-3 sets - 10 reps - Sidelying Shoulder Abduction Full Range of Motion  - 1 x daily - 7 x weekly - 1-3 sets - 10 reps - Sidelying Shoulder Flexion 15 Degrees  - 1 x daily - 7 x weekly - 1-3 sets - 10 reps - Standing Shoulder Row with Anchored Resistance  - 1 x daily - 7 x weekly - 1-3 sets - 10 reps - Seated Overhead Press  - 1 x daily - 7 x weekly - 1-3 sets - 10 reps - 2 hold - Wall  Push Up  - 1 x daily - 7 x weekly - 1 sets - 10 reps - 20-30 seconds hold - Low Trap Setting at Wall  - 1 x daily - 7 x weekly - 1 sets - 10 reps - 2-3 sec hold  01/10/24 - Supine scapular series with yellow theraband  ASSESSMENT:  CLINICAL IMPRESSION: Educated pt on importance of focusing on control and scapular stabilization and engagement with all exercises instead of just doing the exercises. Provided t/c to pt with sidelying shoulder flexion and abduction to teach pt how to engage scapular muscles to help improve strength. His flexion ROM is same as eval but his abduction ROM has improved 9 degrees. Pt demonstrates improved awareness by end of session.   Pt will benefit from skilled therapeutic intervention to improve on the following deficits: decreased knowledge of condition, decreased ROM, decreased  strength, increased fascial restrictions, impaired UE functional use, postural dysfunction, and pain  PT treatment/interventions: ADL/Self care home management, [phone]- PT Re-evaluation, 97750- Physical Performance Testing, 97110-Therapeutic exercises, 97530- Therapeutic activity, [id number]- Neuromuscular re-education, 97535- Self Care, [id number]- Manual therapy, [id number]- Orthotic Initial, [id number]- Orthotic/Prosthetic subsequent, and Patient/Family education     GOALS: Goals reviewed with patient? Yes  LONG TERM GOALS:  (STG=LTG)  GOALS Name Target Date  Goal status  1 Pt will demonstrate a return to baseline cervical ROM measurements and not demonstrate any signs or symptoms of lymphedema. Baseline: 01/30/24 INITIAL  2 Pt will demonstrate 160 degrees of L shoulder flexion to allow him to reach overhead. 01/30/24 NEW  3 Pt will demonstrate 160 degrees of L shoulder abduction to allow him to reach out to the side. 01/30/24 NEW  4 Pt will report a 75% improvement in pain in L shoulder with UE movement to allow improved comfort. 01/30/24 NEW  5 Pt will be independent in a home exercise program for continued stretching and strengthening.  01/30/24 NEW       PLAN:  PT FREQUENCY/DURATION: 2x/wk for 4 wks  PLAN FOR NEXT SESSION: pulleys, ball, s/l L shoulder exercises, shoulder mobs; review supine scapular series and cont scap stability, continue prone Y's, add prone I's & T's    Randall Pack, SPT  01/25/2024 11:53 AM  I agree with the following treatment note after reviewing documentation. This session was performed under the supervision of a licensed clinician.  Saddie Raw, PT 01/25/2024, 11:53 AM    [facility]  [address], [address]  [place] [address]  [phone]  Over Head Pull: Narrow and Wide Grip   [facility] [phone]   On back, knees bent, feet flat, band across thighs, elbows straight but relaxed. Pull hands apart (start). Keeping elbows straight,  bring arms up and over head, hands toward floor. Keep pull steady on band. Hold momentarily. Return slowly, keeping pull steady, back to start. Then do same with a wider grip on the band (past shoulder width) Repeat _5-10__ times. Band color __yellow____   Side Pull: Double Arm   On back, knees bent, feet flat. Arms perpendicular to body, shoulder level, elbows straight but relaxed. Pull arms out to sides, elbows straight. Resistance band comes across collarbones, hands toward floor. Hold momentarily. Slowly return to starting position. Repeat _5-10__ times. Band color _yellow____   Sword   On back, knees bent, feet flat, left hand on left hip, right hand above left. Pull right arm DIAGONALLY (hip to shoulder) across chest. Bring right arm along head toward floor. Hold momentarily. Slowly  return to starting position. Repeat _5-10__ times. Do with left arm. Band color _yellow_____   Shoulder Rotation: Double Arm   On back, knees bent, feet flat, elbows tucked at sides, bent 90, hands palms up. Pull hands apart and down toward floor, keeping elbows near sides. Hold momentarily. Slowly return to starting position. Repeat _5-10__ times. Band color __yellow____

## 2024-01-25 NOTE — Progress Notes (Signed)
 Planning to see patient during infusion for nutrition follow-up. Chart reviewed. Treatment cancelled today. Will reschedule with infusion as able.

## 2024-01-25 NOTE — Progress Notes (Signed)
 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 Patient completed radiation to left parotid 10/21 under the care of Dr. Izell. Currently receiving Libtayo  infusions q21d starting 11/19. Patient under the care of Dr. Autumn.    11/7-11/8 admission s/p TURP + cystolitholapaxy  Patient showed for nutrition appointment. He is doing well overall other than fatigue and dry mouth. Taste has improved a little bit. Enjoying the taste of fresh fruits and vegetables. Patient eating protein at every meal. This week he has had 1/2 steak, chicken, sea bass. The smell of onions/garlic cooking are offensive. Patient leaves the room. He is drinking 32 ounces of water  as well as unsweet tea and juices. Patient asking if a single beer or glass of champagne would be okay to have on occasion.    Medications: reviewed   Labs: no new labs   Anthropometrics: Wt 171.8 lb today (in RD office)  11/19 - 174 lb 1.6 oz  10/27 - 177 lb    NUTRITION DIAGNOSIS: Unintended wt loss - continues   INTERVENTION:  Encourage high calorie high protein shake to aid with wt maintenance  If consuming, limit alcohol intake - no more than 2 drinks for men, 1 for women per [facility]    MONITORING, EVALUATION, GOAL: wt trends, intake   NEXT VISIT: To be scheduled as needed

## 2024-01-29 ENCOUNTER — Ambulatory Visit

## 2024-01-30 ENCOUNTER — Other Ambulatory Visit: Payer: Self-pay

## 2024-01-30 ENCOUNTER — Telehealth: Payer: Self-pay

## 2024-01-30 NOTE — Therapy (Incomplete)
 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   Coronary artery disease    High cholesterol    Hypothyroidism    Prostate cancer [facility])    Past Surgical History:  Procedure Laterality Date   COLONOSCOPY     CYSTOSCOPY WITH LITHOLAPAXY N/A 12/22/2023   Procedure: CYSTOSCOPY, WITH BLADDER CALCULUS LITHOLAPAXY;  Surgeon: Alvaro Ricardo KATHEE Mickey., MD;  Location: [facility] ORS;  Service: Urology;  Laterality: N/A;   ESOPHAGOGASTRODUODENOSCOPY ENDOSCOPY     HERNIA REPAIR Right    inguinal   IR IMAGING GUIDED PORT INSERTION  12/25/2023   MOHS SURGERY     head,ear,cheek,nose   OTOPLASATY Left 09/13/2023   Procedure: OTOPLASTY;  Surgeon: Jesus Oliphant, MD;  Location: [facility] OR;  Service: ENT;  Laterality: Left;  Left wedge resection of pinna with primary closure.   PAROTIDECTOMY Left 09/13/2023   Procedure: EXCISION, PAROTID GLAND;  Surgeon: Jesus Oliphant, MD;  Location: [facility] OR;  Service: ENT;  Laterality: Left;   RADICAL NECK DISSECTION Left 09/13/2023   Procedure: DISSECTION, NECK, RADICAL;  Surgeon: Jesus Oliphant, MD;  Location: [facility] OR;  Service: ENT;  Laterality: Left;   TONSILLECTOMY     TRANSURETHRAL RESECTION OF PROSTATE N/A 12/22/2023   Procedure: TURP (TRANSURETHRAL RESECTION OF PROSTATE);  Surgeon: Alvaro Ricardo KATHEE Mickey., MD;  Location: [facility];  Service: Urology;  Laterality: N/A;   Patient Active Problem List   Diagnosis Date Noted   Prostate hypertrophy 12/22/2023   Cancer of skin of left ear 10/04/2023   Skin cancer 10/04/2023   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   Sensorineural hearing loss, bilateral 05/23/2023   HTN (hypertension) 11/16/2018    Hyperlipidemia 09/24/2014    ONSET DATE: see pertinent history below  REFERRING DIAG:  C77.0 (ICD-10-CM) - Secondary malignant neoplasm of cervical lymph node (HCC)    THERAPY DIAG:  No diagnosis found.  Rationale for Evaluation and Treatment: Rehabilitation  SUBJECTIVE:   SUBJECTIVE STATEMENT: I am better at saying the /s/ and the sh at the beginning of words.  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 A few months ago he developed a lesion located on his left jaw area. Lesion had continued to increase in size. To investigate his symptoms he underwent a CT neck on 07/18/23 showing an ovoid centrally hypodense lesion present posteriorly within or abutting the superficial lobe of the left parotid, measuring approximately 22 x 17 x 18 mm.  08/31/23 He underwent a FNA. Pathology indicated the presence of malignant cells, carcinoma with squamous differentiation with no lymphoid tissue identified. 09/04/23 He presented to Dr. Jesus for second opinion. Based on his recommendations, patient underwent a parotidectomy with radical neck dissection on 09/13/23. Surgical pathology redemonstrated tumor size of 3.5 x 3.2 x 2.3 cm and histology of invasive moderately differentiated squamous cell carcinoma with tumor present in dermis and infiltrates the subcutaneous adipose to superficially invade the underlying parotid gland. Tumor present in 3:00 margin but is negative for angiolymphatic and perineural invasion. Neck dissection pathology of 20 lymph nodes are negative for carcinoma.09/28/23 PET showing no evidence of metastatic disease. 10/03/23  Consult with Dr. Izell. 10/06/23 Consult with Dr. Autumn. He will receive radiation then after it's completed immunotherapy with Libtayo  every 3 weeks for one year.  Treatment plan:  He will receive 33 fractions of radiation to his left parotid which will start on 10/25/23 and will complete  12/11/23.Pretreatment procedures:09/13/23 Left Parotidectomy  PAIN:  Are you having pain? No  FALLS: Has patient fallen in last 6 months?  See PT evaluation for details   PATIENT GOALS: Improve speech clarity  OBJECTIVE:  Note: Objective measures were completed at Evaluation unless otherwise noted.   PATIENT REPORTED OUTCOME MEASURES (PROM): Speech (long form): to be provided in first session                                                                                                                            TREATMENT DATE:   01/31/24: Pt/SLP agreed during last session that he could be seen every approx 8 weeks. PROM provided today. SLP cont'd to work on pt's speech intelligibility with him.  12/06/23: Pt needs PROM next session.  SPEECH: SLP worked with pt today about making his speech as clear as possible, with targeted repeats suggested by SLP for those productions not as clear as possible. Pt self-corrected x5 today. SLP focused pt on his overarticulation in both use of word list and in conversation. SLP told pt to cont to practice with focus on accuracy more than speed of production.  SWALLOWING: Pt has had potato soup with chunks of potato, vanilla wafers, creamed soups, sweet potato fries. Protien drinks are also prevalent now in pt's diet. Bolus manip cont to be functional, but not WNL for pt. Biggest difficulty pt having is food gumming up and making it hard to manipulate.  10/31/23: Today after evaluation SLP instructed pt about and taught pt a home practice program including exercises, and compensation of overarticulation.when talking to improve articulatory precision.   PATIENT EDUCATION: Education details: see treatment date Person educated: Patient Education method: Explanation, Demonstration, Verbal cues, and Handouts Education comprehension: verbalized understanding, returned demonstration, verbal cues required, and needs further education  HOME EXERCISE  PROGRAM: Provided today - see pt instructions   GOALS: Goals reviewed with patient? Yes  SHORT TERM GOALS: Target date: 12/04/23  Pt will improve articulation to limit misarticulations to 7 in 5 minutes conversation  Baseline: Goal status: INITIAL  2.  Pt will complete HEP with min A Baseline:  Goal status: met  3.  Pt will be able to tell SLP rationale for using speech compensations Baseline:  Goal status: met    LONG TERM GOALS: Target date: 01/04/24  Pt will improve PROM compared with initial administration Baseline:  Goal status: INITIAL  2.  Pt will complete HEP with modified independence Baseline:  Goal status: INITIAL  3.  Pt will improve articulation to limit misarticulations to 3 in 5 minutes conversation Baseline:  Goal status: INITIAL   ASSESSMENT:  CLINICAL IMPRESSION: Patient is a 73  y.o. Charles Solis who was seen today for treatment of speech following removal of a parotid gland tumor in July. Signe states that his speech has improved since last session. This has improved since evaluation. He has cont'd his HEP at suggested frequency to work on these phonemes in words in differing positions. Oral dysphagia, he stated, is not a primary concern at this time. SLP continuing to monitor his swallowing to ensure safety while eating.  OBJECTIVE IMPAIRMENTS: Objective impairments include dysarthria and dysphagia. These impairments are limiting patient from ADLs/IADLs, effectively communicating at home and in community, and safety when swallowing.Factors affecting potential to achieve goals and functional outcome are surgical changes.. Patient will benefit from skilled SLP services to address above impairments and improve overall function.  REHAB POTENTIAL: Good  PLAN:  SLP FREQUENCY: once approx every 4 weeks   SLP DURATION: 3 sessions  PLANNED INTERVENTIONS: Oral motor exercises, SLP instruction and feedback, Compensatory strategies, Patient/family education, and  [id number] Treatment of speech (30 or 45 min)     Xzayvier Fagin, CCC-SLP 01/30/2024, 9:02 AM

## 2024-01-30 NOTE — Telephone Encounter (Signed)
 Received voicemail from the patient requesting to reschedule tomorrows appointments. Patient stated he was not feeling well enough to come in to the office. This medical assistant contacted the patient via telephone call @T [phone]. Patient c/o of leg weakness, and low-grade fever.  Patient was checked out by EMS at his home and was not transported to ED. Patient stated his fever broke, current temp 98.3*F. Patient states he feels much better. Let patient know that I would make Dr. Autumn aware. Let patient know to contact our office he had any more concerns. Patient voiced understanding.  Transferred patient's call to the scheduler to reschedule appts.

## 2024-01-31 ENCOUNTER — Inpatient Hospital Stay: Admitting: Oncology

## 2024-01-31 ENCOUNTER — Inpatient Hospital Stay

## 2024-01-31 ENCOUNTER — Ambulatory Visit

## 2024-01-31 ENCOUNTER — Other Ambulatory Visit: Payer: Self-pay | Admitting: Cardiology

## 2024-02-01 ENCOUNTER — Other Ambulatory Visit: Payer: Self-pay

## 2024-02-01 ENCOUNTER — Telehealth: Payer: Self-pay | Admitting: Oncology

## 2024-02-01 ENCOUNTER — Inpatient Hospital Stay

## 2024-02-01 ENCOUNTER — Inpatient Hospital Stay: Admitting: Oncology

## 2024-02-01 VITALS — BP 127/83 | HR 114

## 2024-02-01 VITALS — BP 106/78 | HR 132 | Temp 98.7°F | Resp 18 | Wt 165.1 lb

## 2024-02-01 DIAGNOSIS — C4490 Unspecified malignant neoplasm of skin, unspecified: Secondary | ICD-10-CM

## 2024-02-01 DIAGNOSIS — C4449 Other specified malignant neoplasm of skin of scalp and neck: Secondary | ICD-10-CM | POA: Diagnosis present

## 2024-02-01 DIAGNOSIS — Z79899 Other long term (current) drug therapy: Secondary | ICD-10-CM | POA: Diagnosis not present

## 2024-02-01 DIAGNOSIS — E860 Dehydration: Secondary | ICD-10-CM | POA: Diagnosis not present

## 2024-02-01 DIAGNOSIS — C44209 Unspecified malignant neoplasm of skin of left ear and external auricular canal: Secondary | ICD-10-CM

## 2024-02-01 DIAGNOSIS — Z923 Personal history of irradiation: Secondary | ICD-10-CM | POA: Diagnosis not present

## 2024-02-01 DIAGNOSIS — Z7982 Long term (current) use of aspirin: Secondary | ICD-10-CM | POA: Diagnosis not present

## 2024-02-01 DIAGNOSIS — Z8546 Personal history of malignant neoplasm of prostate: Secondary | ICD-10-CM | POA: Diagnosis not present

## 2024-02-01 LAB — CMP (CANCER CENTER ONLY)
ALT: 43 U/L (ref 0–44)
AST: 71 U/L — ABNORMAL HIGH (ref 15–41)
Albumin: 4.2 g/dL (ref 3.5–5.0)
Alkaline Phosphatase: 54 U/L (ref 38–126)
Anion gap: 13 (ref 5–15)
BUN: 18 mg/dL (ref 8–23)
CO2: 25 mmol/L (ref 22–32)
Calcium: 9.2 mg/dL (ref 8.9–10.3)
Chloride: 100 mmol/L (ref 98–111)
Creatinine: 0.81 mg/dL (ref 0.61–1.24)
GFR, Estimated: >60 mL/min (ref >60)
Glucose, Bld: 104 mg/dL — ABNORMAL HIGH (ref 70–99)
Potassium: 3.8 mmol/L (ref 3.5–5.1)
Sodium: 138 mmol/L (ref 135–145)
Total Bilirubin: 0.5 mg/dL (ref 0.0–1.2)
Total Protein: 6.5 g/dL (ref 6.5–8.1)

## 2024-02-01 LAB — CBC WITH DIFFERENTIAL (CANCER CENTER ONLY)
Abs Immature Granulocytes: 0.01 K/uL (ref 0.00–0.07)
Basophils Absolute: 0 K/uL (ref 0.0–0.1)
Basophils Relative: 0 %
Eosinophils Absolute: 0 K/uL (ref 0.0–0.5)
Eosinophils Relative: 0 %
HCT: 40.5 % (ref 39.0–52.0)
Hemoglobin: 14.2 g/dL (ref 13.0–17.0)
Immature Granulocytes: 0 %
Lymphocytes Relative: 10 %
Lymphs Abs: 0.5 K/uL — ABNORMAL LOW (ref 0.7–4.0)
MCH: 30.1 pg (ref 26.0–34.0)
MCHC: 35.1 g/dL (ref 30.0–36.0)
MCV: 85.8 fL (ref 80.0–100.0)
Monocytes Absolute: 0.6 K/uL (ref 0.1–1.0)
Monocytes Relative: 14 %
Neutro Abs: 3.3 K/uL (ref 1.7–7.7)
Neutrophils Relative %: 76 %
Platelet Count: 146 K/uL — ABNORMAL LOW (ref 150–400)
RBC: 4.72 MIL/uL (ref 4.22–5.81)
RDW: 13 % (ref 11.5–15.5)
WBC Count: 4.5 K/uL (ref 4.0–10.5)
nRBC: 0 % (ref 0.0–0.2)

## 2024-02-01 NOTE — Assessment & Plan Note (Addendum)
 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   Post-operative status with positive deep margin, necessitating adjuvant radiation therapy.   No evidence of metastasis, but considered locally advanced due to depth of involvement.   Completed adjuvant radiation treatments on 12/05/2023.  Immunotherapy with cemiplimab  proposed post-radiation to prevent recurrence and manage any residual disease.   Discussed the mechanism of action of cemiplimab  as an immunotherapy that unmasks cancer cells to the immune system. Potential side effects include immune-mediated organ damage, with thyroid  dysfunction being the most common. Severe side effects are rare, occurring in less than 0.5% of cases. The decision to use cemiplimab  is based on its efficacy in preventing recurrence in similar cases and its role as a standard of care in such scenarios. Immunotherapy expected to last 1-2 years, depending on response and tolerance. Aims to enhance immune system's ability to recognize and attack cancer cells. Potential side effects include autoimmune reactions, with thyroid  dysfunction being most common. Regular monitoring for side effects, managed with steroids and treatment breaks as needed.  Patient agreeable to proceed with treatment.  He is recovered from recent TURP procedure.  He started treatments with Cemiplimab  from 01/03/2024.  Plan was to continue this every 3 weeks.  Presented for dose #2 of cemiplimab  today.  He has had some respiratory/viral illness over the last few days which is causing significant fatigue, hypotension and tachycardia.  Hence we will hold treatment with cemiplimab  today and plan to resume in approximately 3 weeks from now.  He was given 1 L of NS bolus in clinic today.  We will check cortisol, ACTH, TSH, prolactin, FSH, LH with return visit to make sure there is no signs of hypophysitis, although it is less likely with just 1 dose of Cemiplimab .  -Will  consider restaging imaging (CT or PET scan) every three months to monitor for recurrence or metastasis.  RTC in 3 weeks for labs, office visit and continuation of Cemiplimab  treatments.

## 2024-02-01 NOTE — Patient Instructions (Signed)

## 2024-02-01 NOTE — Progress Notes (Signed)
 [facility]  ONCOLOGY CLINIC PROGRESS NOTE   Patient Care Team: Patient, No Pcp Per as PCP - General (General Practice) Nahser, Aleene PARAS, MD (Inactive) as PCP - Cardiology (Cardiology) Izell Domino, MD as Attending Physician (Radiation Oncology) Autumn Millman, MD as Consulting Physician (Oncology) Jesus Oliphant, MD as Consulting Physician (Otolaryngology) Malmfelt, Delon CROME, RN as Oncology Nurse Navigator  PATIENT NAME: Charles Solis   MR#: [id number] DOB: 04/17/1950  Date of visit: 02/01/2024   ASSESSMENT & PLAN:   Charles Solis is a 73 y.o.  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   Post-operative status with positive deep margin, necessitating adjuvant radiation therapy.   No evidence of metastasis, but considered locally advanced due to depth of involvement.   Completed adjuvant radiation treatments on 12/05/2023.  Immunotherapy with cemiplimab  proposed post-radiation to prevent recurrence and manage any residual disease.   Discussed the mechanism of action of cemiplimab  as an immunotherapy that unmasks cancer cells to the immune system. Potential side effects include immune-mediated organ damage, with thyroid  dysfunction being the most common. Severe side effects are rare, occurring in less than 0.5% of cases. The decision to use cemiplimab  is based on its efficacy in preventing recurrence in similar cases and its role as a standard of care in such scenarios. Immunotherapy expected to last 1-2 years, depending on response and tolerance. Aims to enhance immune system's ability to recognize and attack cancer cells. Potential side effects include autoimmune reactions,  with thyroid  dysfunction being most common. Regular monitoring for side effects, managed with steroids and treatment breaks as needed.  Patient agreeable to proceed with treatment.  He is recovered from recent TURP procedure.  He started treatments with Cemiplimab  from 01/03/2024.  Plan was to continue this every 3 weeks.  Presented for dose #2 of cemiplimab  today.  He has had some respiratory/viral illness over the last few days which is causing significant fatigue, hypotension and tachycardia.  Hence we will hold treatment with cemiplimab  today and plan to resume in approximately 3 weeks from now.  He was given 1 L of NS bolus in clinic today.  We will check cortisol, ACTH, TSH, prolactin, FSH, LH with return visit to make sure there is no signs of hypophysitis, although it is less likely with just 1 dose of Cemiplimab .  -Will consider restaging imaging (CT or PET scan) every three months to monitor for recurrence or metastasis.  RTC in 3 weeks for labs, office visit and continuation of Cemiplimab  treatments.  Acute viral illness He presented with fatigue, weakness, mild fever, and cough. COVID-19 testing was negative. Hemodynamic instability, including elevated heart rate and hypotension, was attributed to the acute illness. Blood counts remain within normal limits. Immunotherapy was withheld due to these symptoms. Clinical improvement is anticipated with supportive care. - Withheld immunotherapy due to hemodynamic instability from acute illness. - Administered intravenous fluids to support recovery and address symptoms. - Advised to maintain hydration and increase protein intake at home. - Scheduled follow-up in three weeks to reassess clinical status. - Plan to check blood work at next visit for possible hormone imbalance if symptoms persist.  Dehydration He exhibited signs of dehydration, including tachycardia and weakness, likely  secondary to acute viral illness and reduced oral  intake. - Administered intravenous fluids to treat dehydration. - Advised to maintain adequate hydration at home.  Unintentional weight loss He reported decreased appetite and reduced oral intake since onset of acute illness, resulting in weight loss. Appetite has improved but remains below baseline. - Advised to increase protein intake at home using nutritional supplements such as Boost or Ensure.  History of falls He experienced a fall from bed during the acute illness, resulting in minor injuries. The event was likely related to weakness from the viral illness. He uses a walker for mobility and has missed some physical therapy sessions but continues exercises at home. - Recommended resuming physical therapy after recovery from acute illness.  Transurethral Resection of the Prostate (TURP) Underwent TURP on December 22, 2023.  Recovering well from the procedure  I reviewed lab results and outside records for this visit and discussed relevant results with the patient. Diagnosis, plan of care and treatment options were also discussed in detail with the patient. Opportunity provided to ask questions and answers provided to his apparent satisfaction. Provided instructions to call our clinic with any problems, questions or concerns prior to return visit. I recommended to continue follow-up with PCP and sub-specialists. He verbalized understanding and agreed with the plan.   NCCN guidelines have been consulted in the planning of this patients care.  I spent a total of 42 minutes during this encounter with the patient including review of chart and various tests results, discussions about plan of care and coordination of care plan.   Chinita Patten, MD  02/01/2024 10:32 AM  [facility] - A DEPT OF 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  Current Treatment: Received adjuvant radiation for positive deep margin.  Completed radiation treatments on 12/05/2023.  Given locally advanced nature, proposed adjuvant treatments with cemiplimab  and patient is agreeable.  Started this from 01/03/2024.  INTERVAL HISTORY:    Discussed the use of AI scribe software for clinical note transcription with the patient, who gave verbal consent to proceed.  History of Present Illness Charles Solis is a 73 year old male with malignant neoplasm of the skin undergoing immunotherapy and post-radiation therapy who presents with acute fatigue and weakness during cancer treatment.  He developed acute onset fatigue and weakness beginning Sunday, associated with a mild fever up to 101F and tachycardia, while blood pressure remained stable. These symptoms led to a fall from bed while attempting to urinate, resulting in minor head trauma, abrasions to the elbows, and a superficial knee injury. He experienced transient improvement on Monday and Tuesday, but by Wednesday night, he was again unable to rise from the couch due to persistent weakness.  He reported the onset of a mild, non-productive cough on Saturday, which has persisted intermittently. He took three doses of an unspecified medication on Sunday, previously used by his caregiver, but has not taken any since. A COVID-19 test performed on Sunday was negative. He denied nausea and has not required antiemetic therapy.  His appetite has been diminished since the weekend, with ongoing weight loss despite some improvement in food palatability. He described difficulty with bladder emptying, particularly when supine, but improved voiding when upright. He denied dysuria or  other urinary symptoms.  He has missed several physical therapy sessions for his shoulder due to recent illness but  continues exercises at home.     I have reviewed the past medical history, past surgical history, social history and family history with the patient and they are unchanged from previous note.  HISTORY OF PRESENT ILLNESS:   ONCOLOGY HISTORY:   He has a history significant for prostate cancer and skin cancer with multiple recurrences. The most recent skin cancer removal was Mohs surgery to the left pinna about 3 years ago.    A few months ago, patient developed a lesion located on his left jaw area. Lesion has since continued increasing in size. To further investigate his symptoms, he underwent a CT neck on 07/18/23 showing an ovoid centrally hypodense lesion present posteriorly within or abutting the superficial lobe of the left parotid, measuring approximately 22 x 17 x 18 mm.    In light of findings, he then underwent a fine-needle aspiration biopsy on 08/31/23. Pathology indicated the presence of malignant cells, carcinoma with squamous differentiation, with no lymphoid tissue identified.   Patient then presented for a second-opinion with Dr. Jesus on 09/04/23. Based on his recommendations, patient underwent a parotidectomy with radical neck dissection on 09/13/23. Surgical pathology redemonstrated tumor size of 3.5 x 3.2 x 2.3 cm  and histology of  invasive moderately differentiated squamous cell carcinoma with tumor present in dermis and infiltrates the subcutaneous adipose to superficially invade the underlying parotid gland. Tumor present in 3:00 margin but is negative for angiolymphatic and perineural invasion. Neck dissection pathology of 20 lymph nodes are negative for carcinoma.             Staging PET scan on 09/28/2023 showed left sided neck hypermetabolic some without well-defined correlate mass or adenopathy and is likely postoperative.  No evidence of extracervical suspicious activity.  Incidental findings include a 12 mm bladder stone and evidence of CAD and aortic atherosclerosis.  4 mm  right lower lobe lung nodule, below PET resolution.   Positive deep margin, necessitating adjuvant radiation therapy.  Completed adjuvant radiation treatments on 12/05/2023.   No evidence of metastasis, but considered locally advanced due to depth of involvement.    Immunotherapy with cemiplimab  proposed post-radiation to prevent recurrence and manage any residual disease.  Patient agreeable with this approach.  He underwent TURP for prostate issue on 12/22/2023.    Started cemiplimab  treatments from 01/03/2024.  Oncology History  Skin cancer  10/04/2023 Initial Diagnosis   Skin cancer   10/04/2023 Cancer Staging   Staging form: Cutaneous Carcinoma of the Head and Neck, AJCC 8th Edition - Pathologic stage from 10/04/2023: Stage IV (rpTX, rpN3b, rcM0) - Signed by Izell Domino, MD on 10/04/2023 Stage prefix: Recurrence Extraosseous extension: Unknown   01/03/2024 -  Chemotherapy   Patient is on Treatment Plan : ADVANCED CUTANEOUS SQUAMOUS CELL CARCINOMA Cemiplimab  q21d         REVIEW OF SYSTEMS:   Review of Systems - Oncology  All other pertinent systems were reviewed with the patient and are negative.  ALLERGIES: He has no known allergies.  MEDICATIONS:  Current Outpatient Medications  Medication Sig Dispense Refill   aspirin EC 81 MG tablet Take 81 mg by mouth daily. Swallow whole.     cyanocobalamin  (VITAMIN B12) 1000 MCG tablet Take 1,000 mcg by mouth daily.     ezetimibe  (ZETIA ) 10 MG tablet TAKE ONE TABLET BY MOUTH EVERY DAY 90 tablet 3   fexofenadine (ALLEGRA) 180  MG tablet Take 180 mg by mouth daily.     finasteride  (PROSCAR ) 5 MG tablet Take 5 mg by mouth daily.     ipratropium (ATROVENT ) 0.03 % nasal spray Place 1 spray into both nostrils daily.     lidocaine -prilocaine  (EMLA ) cream Apply to affected area once 30 g 3   metoprolol  tartrate (LOPRESSOR ) 25 MG tablet Take 1 tablet (25 mg total) by mouth 2 (two) times daily. 180 tablet 1   pseudoephedrine (SUDAFED) 30  MG tablet Take 30 mg by mouth every 4 (four) hours as needed for congestion.     rosuvastatin  (CRESTOR ) 20 MG tablet Take 1 tablet (20 mg total) by mouth daily. 90 tablet 3   ondansetron  (ZOFRAN ) 8 MG tablet Take 1 tablet (8 mg total) by mouth every 8 (eight) hours as needed for nausea or vomiting. (Patient not taking: Reported on 02/01/2024) 30 tablet 1   prochlorperazine  (COMPAZINE ) 10 MG tablet Take 1 tablet (10 mg total) by mouth every 6 (six) hours as needed for nausea or vomiting. (Patient not taking: Reported on 02/01/2024) 30 tablet 1   No current facility-administered medications for this visit.     VITALS:   Blood pressure 106/78, pulse (!) 132, temperature 98.7 F (37.1 C), temperature source Tympanic, resp. rate 18, weight 165 lb 1.6 oz (74.9 kg), SpO2 100%.  Wt Readings from Last 3 Encounters:  02/01/24 165 lb 1.6 oz (74.9 kg)  01/03/24 174 lb 1.6 oz (79 kg)  12/25/23 179 lb 0.2 oz (81.2 kg)    Body mass index is 21.78 kg/m.    Onc Performance Status - 02/01/24 0910       ECOG Perf Status   ECOG Perf Status Restricted in physically strenuous activity but ambulatory and able to carry out work of a light or sedentary nature, e.g., light house work, office work      KPS SCALE   KPS % SCORE Requires considerable assistance, and frequent medical care          PHYSICAL EXAM:   Physical Exam Constitutional:      General: He is not in acute distress.    Appearance: Normal appearance.  HENT:     Head: Normocephalic and atraumatic.  Eyes:     Conjunctiva/sclera: Conjunctivae normal.  Neck:     Comments: Scars from  recent carcinoma excision on the left side of neck/parotid area are well-healed.  Cardiovascular:     Rate and Rhythm: Normal rate and regular rhythm.  Pulmonary:     Effort: Pulmonary effort is normal. No respiratory distress.  Abdominal:     General: There is no distension.  Neurological:     General: No focal deficit present.     Mental Status:  He is alert and oriented to person, place, and time.  Psychiatric:        Mood and Affect: Mood normal.        Behavior: Behavior normal.      LABORATORY DATA:   I have reviewed the data as listed.  Results for orders placed or performed in visit on 02/01/24  CBC with Differential (Cancer Center Only)  Result Value Ref Range   WBC Count 4.5 4.0 - 10.5 K/uL   RBC 4.72 4.22 - 5.81 MIL/uL   Hemoglobin 14.2 13.0 - 17.0 g/dL   HCT 59.4 60.9 - 47.9 %   MCV 85.8 80.0 - 100.0 fL   MCH 30.1 26.0 - 34.0 pg   MCHC 35.1 30.0 - 36.0 g/dL  RDW 13.0 11.5 - 15.5 %   Platelet Count 146 (L) 150 - 400 K/uL   nRBC 0.0 0.0 - 0.2 %   Neutrophils Relative % 76 %   Neutro Abs 3.3 1.7 - 7.7 K/uL   Lymphocytes Relative 10 %   Lymphs Abs 0.5 (L) 0.7 - 4.0 K/uL   Monocytes Relative 14 %   Monocytes Absolute 0.6 0.1 - 1.0 K/uL   Eosinophils Relative 0 %   Eosinophils Absolute 0.0 0.0 - 0.5 K/uL   Basophils Relative 0 %   Basophils Absolute 0.0 0.0 - 0.1 K/uL   Immature Granulocytes 0 %   Abs Immature Granulocytes 0.01 0.00 - 0.07 K/uL      RADIOGRAPHIC STUDIES:  No recent pertinent imaging available to review.   CODE STATUS:  Code Status History     Date Active Date Inactive Code Status Order ID Comments User Context   09/13/2023 1247 09/14/2023 1436 Full Code [id number]  Jesus Oliphant, MD Inpatient    Questions for Most Recent Historical Code Status (Order [id number])     Question Answer   By: Other            Orders Placed This Encounter  Procedures   CBC with Differential (Cancer Center Only)    Standing Status:   Future    Expected Date:   02/22/2024    Expiration Date:   02/21/2025   CMP (Cancer Center only)    Standing Status:   Future    Expected Date:   02/22/2024    Expiration Date:   02/21/2025   TSH    Standing Status:   Future    Expected Date:   02/22/2024    Expiration Date:   02/21/2025   Cortisol    Standing Status:   Future    Expected Date:   02/22/2024    Expiration  Date:   02/21/2025   Prolactin    Standing Status:   Future    Expected Date:   02/22/2024    Expiration Date:   02/21/2025   ACTH    Standing Status:   Future    Expected Date:   02/22/2024    Expiration Date:   02/21/2025   FSH-Follicle stimulating hormone    Standing Status:   Future    Expected Date:   02/22/2024    Expiration Date:   02/21/2025   LH-Luteinizing hormone    Standing Status:   Future    Expected Date:   02/22/2024    Expiration Date:   02/21/2025     Future Appointments  Date Time Provider Department Center  03/11/2024  1:30 PM [facility] 2 [facility] [place]  03/11/2024  2:00 PM [facility] 1 [facility] [place]  03/19/2024 10:00 AM Wyatt Leeroy HERO, PA-C [facility] None    This document was completed utilizing speech recognition software. Grammatical errors, random word insertions, pronoun errors, and incomplete sentences are an occasional consequence of this system due to software limitations, ambient noise, and hardware issues. Any formal questions or concerns about the content, text or information contained within the body of this dictation should be directly addressed to the provider for clarification.

## 2024-02-01 NOTE — Telephone Encounter (Signed)
 I spoke with patient regarding appointments 02/22/2024. Patient is aware of date/time.

## 2024-02-15 ENCOUNTER — Encounter: Payer: Self-pay | Admitting: Oncology

## 2024-02-21 ENCOUNTER — Other Ambulatory Visit: Payer: Self-pay

## 2024-02-22 ENCOUNTER — Inpatient Hospital Stay: Attending: Oncology

## 2024-02-22 ENCOUNTER — Encounter: Payer: Self-pay | Admitting: Oncology

## 2024-02-22 ENCOUNTER — Other Ambulatory Visit: Payer: Self-pay

## 2024-02-22 ENCOUNTER — Inpatient Hospital Stay: Attending: Oncology | Admitting: Oncology

## 2024-02-22 ENCOUNTER — Inpatient Hospital Stay

## 2024-02-22 VITALS — BP 125/78 | HR 70 | Temp 98.3°F | Resp 18 | Wt 173.0 lb

## 2024-02-22 VITALS — BP 126/87 | HR 76 | Resp 16

## 2024-02-22 DIAGNOSIS — Z5112 Encounter for antineoplastic immunotherapy: Secondary | ICD-10-CM | POA: Diagnosis present

## 2024-02-22 DIAGNOSIS — Z7982 Long term (current) use of aspirin: Secondary | ICD-10-CM | POA: Insufficient documentation

## 2024-02-22 DIAGNOSIS — C44209 Unspecified malignant neoplasm of skin of left ear and external auricular canal: Secondary | ICD-10-CM | POA: Diagnosis not present

## 2024-02-22 DIAGNOSIS — C4449 Other specified malignant neoplasm of skin of scalp and neck: Secondary | ICD-10-CM | POA: Insufficient documentation

## 2024-02-22 DIAGNOSIS — Z9079 Acquired absence of other genital organ(s): Secondary | ICD-10-CM | POA: Insufficient documentation

## 2024-02-22 DIAGNOSIS — Z8546 Personal history of malignant neoplasm of prostate: Secondary | ICD-10-CM | POA: Insufficient documentation

## 2024-02-22 DIAGNOSIS — C4490 Unspecified malignant neoplasm of skin, unspecified: Secondary | ICD-10-CM

## 2024-02-22 DIAGNOSIS — Z923 Personal history of irradiation: Secondary | ICD-10-CM | POA: Diagnosis not present

## 2024-02-22 DIAGNOSIS — Z79899 Other long term (current) drug therapy: Secondary | ICD-10-CM | POA: Insufficient documentation

## 2024-02-22 LAB — CBC WITH DIFFERENTIAL (CANCER CENTER ONLY)
Abs Immature Granulocytes: 0.03 K/uL (ref 0.00–0.07)
Basophils Absolute: 0 K/uL (ref 0.0–0.1)
Basophils Relative: 1 %
Eosinophils Absolute: 0.1 K/uL (ref 0.0–0.5)
Eosinophils Relative: 1 %
HCT: 36.9 % — ABNORMAL LOW (ref 39.0–52.0)
Hemoglobin: 12.8 g/dL — ABNORMAL LOW (ref 13.0–17.0)
Immature Granulocytes: 1 %
Lymphocytes Relative: 10 %
Lymphs Abs: 0.6 K/uL — ABNORMAL LOW (ref 0.7–4.0)
MCH: 30.3 pg (ref 26.0–34.0)
MCHC: 34.7 g/dL (ref 30.0–36.0)
MCV: 87.2 fL (ref 80.0–100.0)
Monocytes Absolute: 0.6 K/uL (ref 0.1–1.0)
Monocytes Relative: 10 %
Neutro Abs: 4.5 K/uL (ref 1.7–7.7)
Neutrophils Relative %: 77 %
Platelet Count: 191 K/uL (ref 150–400)
RBC: 4.23 MIL/uL (ref 4.22–5.81)
RDW: 13.4 % (ref 11.5–15.5)
WBC Count: 5.8 K/uL (ref 4.0–10.5)
nRBC: 0 % (ref 0.0–0.2)

## 2024-02-22 LAB — CMP (CANCER CENTER ONLY)
ALT: 23 U/L (ref 0–44)
AST: 27 U/L (ref 15–41)
Albumin: 4 g/dL (ref 3.5–5.0)
Alkaline Phosphatase: 49 U/L (ref 38–126)
Anion gap: 8 (ref 5–15)
BUN: 15 mg/dL (ref 8–23)
CO2: 25 mmol/L (ref 22–32)
Calcium: 9 mg/dL (ref 8.9–10.3)
Chloride: 108 mmol/L (ref 98–111)
Creatinine: 0.86 mg/dL (ref 0.61–1.24)
GFR, Estimated: >60 mL/min
Glucose, Bld: 100 mg/dL — ABNORMAL HIGH (ref 70–99)
Potassium: 4 mmol/L (ref 3.5–5.1)
Sodium: 141 mmol/L (ref 135–145)
Total Bilirubin: 0.6 mg/dL (ref 0.0–1.2)
Total Protein: 5.9 g/dL — ABNORMAL LOW (ref 6.5–8.1)

## 2024-02-22 LAB — TSH: TSH: 1.01 u[IU]/mL (ref 0.350–4.500)

## 2024-02-22 LAB — CORTISOL: Cortisol, Plasma: 9.5 ug/dL

## 2024-02-22 MED ORDER — SODIUM CHLORIDE 0.9 % IV SOLN: INTRAVENOUS | Status: DC

## 2024-02-22 MED ORDER — SODIUM CHLORIDE 0.9 % IV SOLN
350.0000 mg | Freq: Once | INTRAVENOUS | Status: AC
  Administered 2024-02-22: 350 mg via INTRAVENOUS
  Filled 2024-02-22: qty 7

## 2024-02-22 MED ORDER — SODIUM CHLORIDE 0.9% FLUSH: 10.0000 mL | INTRAVENOUS | Status: DC | PRN

## 2024-02-22 NOTE — Progress Notes (Signed)
 "  [facility]  ONCOLOGY CLINIC PROGRESS NOTE   Patient Care Team: Patient, No Pcp Per as PCP - General (General Practice) Nahser, Aleene PARAS, MD (Inactive) as PCP - Cardiology (Cardiology) Izell Domino, MD as Attending Physician (Radiation Oncology) Autumn Millman, MD as Consulting Physician (Oncology) Jesus Oliphant, MD as Consulting Physician (Otolaryngology) Malmfelt, Delon CROME, RN as Oncology Nurse Navigator  PATIENT NAME: Charles Solis   MR#: [id number] DOB: 1951/01/04  Date of visit: 02/22/2024   ASSESSMENT & PLAN:   Charles Solis is a 74 y.o.  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 rpTx,rpN3b,rcM0, Stage IV disease.  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   Post-operative status with positive deep margin, necessitating adjuvant radiation therapy.   No evidence of metastasis, but considered locally advanced due to depth of involvement.   Completed adjuvant radiation treatments on 12/05/2023.  Immunotherapy with cemiplimab  proposed post-radiation to prevent recurrence and manage any residual disease.   Discussed the mechanism of action of cemiplimab  as an immunotherapy that unmasks cancer cells to the immune system. Potential side effects include immune-mediated organ damage, with thyroid  dysfunction being the most common. Severe side effects are rare, occurring in less than 0.5% of cases. The decision to use cemiplimab  is based on its efficacy in preventing recurrence in similar cases and its role as a standard of care in such scenarios. Immunotherapy expected to last 1-2 years, depending on response and tolerance. Aims to enhance immune system's ability to recognize and attack cancer cells. Potential side  effects include autoimmune reactions, with thyroid  dysfunction being most common. Regular monitoring for side effects, managed with steroids and treatment breaks as needed.  Patient agreeable to proceed with treatment.  He is recovered from recent TURP procedure.  He started treatments with Cemiplimab  from 01/03/2024.  Plan was to continue this every 3 weeks.  Presented for dose #2 of cemiplimab  today.  He has had some respiratory/viral illness over the last few days which is causing significant fatigue, hypotension and tachycardia.  Hence we will hold treatment with cemiplimab  today and plan to resume in approximately 3 weeks from now.  He was given 1 L of NS bolus in clinic today.  We will check cortisol, ACTH , TSH, prolactin, FSH, LH with return visit to make sure there is no signs of hypophysitis, although it is less likely with just 1 dose of Cemiplimab .  -Will consider restaging imaging (CT or PET scan) every three months to monitor for recurrence or metastasis.  RTC in 3 weeks for labs, office visit and continuation of Cemiplimab  treatments.  Transurethral Resection of the Prostate (TURP) Underwent TURP on December 22, 2023.  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 Symptoms remain mild and non-disabling. Non-pharmacologic interventions have provided limited benefit. Pilocarpine was considered, but the risk of adverse effects, particularly urinary incontinence and exacerbation of urinary urgency, outweighs potential benefit given current symptom severity and urinary symptoms. - Recommended frequent sips of water  during meals. - Discussed pilocarpine, including risks of flushing, sweating, palpitations, blood pressure changes, diarrhea, and urinary incontinence; advised against use due to risk profile and current urinary urgency. - Advised trial of non-pharmacologic  measures such as  hard candy; xylomilk was ineffective and bothersome.  Post-viral syndrome Recovering from recent viral illness with residual intermittent pharyngitis and occasional productive cough. Energy and stamina are improving. Prior laboratory abnormalities, including thrombocytopenia and elevated AST, have resolved. Mildly decreased hemoglobin and lymphocyte count are attributed to recent illness and are not concerning. - Ordered laboratory tests to assess for possible hormone imbalances and monitor recovery. - Reviewed laboratory results and reassured regarding normalization of platelet count, AST, and glucose.  I reviewed lab results and outside records for this visit and discussed relevant results with the patient. Diagnosis, plan of care and treatment options were also discussed in detail with the patient. Opportunity provided to ask questions and answers provided to his apparent satisfaction. Provided instructions to call our clinic with any problems, questions or concerns prior to return visit. I recommended to continue follow-up with PCP and sub-specialists. He verbalized understanding and agreed with the plan.   NCCN guidelines have been consulted in the planning of this patients care.  I spent a total of 42 minutes during this encounter with the patient including review of chart and various tests results, discussions about plan of care and coordination of care plan.   Chinita Patten, MD  02/22/2024 9:04 AM  [facility] - A DEPT OF [facility] 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 rpTx,rpN3b,rcM0, Stage IV disease.  Current Treatment: Received adjuvant radiation for positive deep margin.  Completed radiation treatments  on 12/05/2023.  Given locally advanced nature, proposed adjuvant treatments with cemiplimab  and patient is agreeable.  Started this from 01/03/2024.  INTERVAL HISTORY:    Discussed the use of AI scribe software for clinical note transcription with the patient, who gave verbal consent to proceed.  History of Present Illness  Charles Solis is a 74 year old male with malignant neoplasm of the left ear, status post radiation therapy, who presents for oncology follow-up.  He is recovering from a recent viral illness that began prior to Christmas, characterized by cough, intermittent sore throat, and decreased energy. The cough is now occasional and productive, and the sore throat is intermittent. He has not had fever. He reports persistent reduced energy and stamina, which he attributes to recent radiation therapy, but overall feels improved compared to his last visit. He denies recent falls.  He experienced significant weight loss during his illness, reaching a nadir of 165 lbs, but his weight has since stabilized and increased to 173 lbs. His baseline prior to illness was approximately 182-183 lbs. He reports improved appetite, better taste, and increased food intake, including ice cream at night to aid weight gain.  He continues to experience persistent dry mouth that worsens during meals and impairs his ability to eat crackers, bread, and meats. He tolerates vegetables and produce better. He drinks frequent sips of water  with meals and has tried hard candies, including xylomilk, without significant relief. He denies dysuria, hematuria, or incontinence, but notes some urinary urgency.     I have reviewed the past medical history, past surgical history, social history and family history with the patient and they are unchanged from previous note.  HISTORY OF PRESENT ILLNESS:   ONCOLOGY HISTORY:   He has a history significant for prostate cancer and skin cancer with multiple  recurrences.  The most recent skin cancer removal was Mohs surgery to the left pinna about 3 years ago.    A few months ago, patient developed a lesion located on his left jaw area. Lesion has since continued increasing in size. To further investigate his symptoms, he underwent a CT neck on 07/18/23 showing an ovoid centrally hypodense lesion present posteriorly within or abutting the superficial lobe of the left parotid, measuring approximately 22 x 17 x 18 mm.    In light of findings, he then underwent a fine-needle aspiration biopsy on 08/31/23. Pathology indicated the presence of malignant cells, carcinoma with squamous differentiation, with no lymphoid tissue identified.   Patient then presented for a second-opinion with Dr. Jesus on 09/04/23. Based on his recommendations, patient underwent a parotidectomy with radical neck dissection on 09/13/23. Surgical pathology redemonstrated tumor size of 3.5 x 3.2 x 2.3 cm  and histology of  invasive moderately differentiated squamous cell carcinoma with tumor present in dermis and infiltrates the subcutaneous adipose to superficially invade the underlying parotid gland. Tumor present in 3:00 margin but is negative for angiolymphatic and perineural invasion. Neck dissection pathology of 20 lymph nodes are negative for carcinoma.             Staging PET scan on 09/28/2023 showed left sided neck hypermetabolic some without well-defined correlate mass or adenopathy and is likely postoperative.  No evidence of extracervical suspicious activity.  Incidental findings include a 12 mm bladder stone and evidence of CAD and aortic atherosclerosis.  4 mm right lower lobe lung nodule, below PET resolution.  rpTx,rpN3b,rcM0, Stage IV disease.   Positive deep margin, necessitating adjuvant radiation therapy.  Completed adjuvant radiation treatments on 12/05/2023.   No evidence of metastasis, but considered locally advanced due to depth of involvement.    Immunotherapy with cemiplimab   proposed post-radiation to prevent recurrence and manage any residual disease.  Patient agreeable with this approach.  He underwent TURP for prostate issue on 12/22/2023.    Started cemiplimab  treatments from 01/03/2024.  Oncology History  Skin cancer  10/04/2023 Initial Diagnosis   Skin cancer   10/04/2023 Cancer Staging   Staging form: Cutaneous Carcinoma of the Head and Neck, AJCC 8th Edition - Pathologic stage from 10/04/2023: Stage IV (rpTX, rpN3b, rcM0) - Signed by Izell Domino, MD on 10/04/2023 Stage prefix: Recurrence Extraosseous extension: Unknown   01/03/2024 -  Chemotherapy   Patient is on Treatment Plan : ADVANCED CUTANEOUS SQUAMOUS CELL CARCINOMA Cemiplimab  q21d         REVIEW OF SYSTEMS:   Review of Systems - Oncology  All other pertinent systems were reviewed with the patient and are negative.  ALLERGIES: He has no known allergies.  MEDICATIONS:  Current Outpatient Medications  Medication Sig Dispense Refill   aspirin EC 81 MG tablet Take 81 mg by mouth daily. Swallow whole.     cyanocobalamin  (VITAMIN B12) 1000 MCG tablet Take 1,000 mcg by mouth daily.     ezetimibe  (ZETIA ) 10 MG tablet TAKE ONE TABLET BY MOUTH EVERY DAY 90 tablet 3   fexofenadine (ALLEGRA) 180 MG tablet Take 180 mg by mouth daily.     finasteride  (PROSCAR ) 5 MG tablet Take 5 mg by mouth daily.     ipratropium (ATROVENT ) 0.03 % nasal spray Place 1 spray into both nostrils daily.     lidocaine -prilocaine  (EMLA ) cream Apply to affected area once 30 g 3   metoprolol  tartrate (LOPRESSOR ) 25 MG tablet Take 1 tablet (25 mg total) by mouth  2 (two) times daily. 180 tablet 1   pseudoephedrine (SUDAFED) 30 MG tablet Take 30 mg by mouth every 4 (four) hours as needed for congestion.     rosuvastatin  (CRESTOR ) 20 MG tablet Take 1 tablet (20 mg total) by mouth daily. 90 tablet 3   ondansetron  (ZOFRAN ) 8 MG tablet Take 1 tablet (8 mg total) by mouth every 8 (eight) hours as needed for nausea or vomiting.  (Patient not taking: Reported on 02/22/2024) 30 tablet 1   prochlorperazine  (COMPAZINE ) 10 MG tablet Take 1 tablet (10 mg total) by mouth every 6 (six) hours as needed for nausea or vomiting. (Patient not taking: Reported on 02/22/2024) 30 tablet 1   No current facility-administered medications for this visit.     VITALS:   Blood pressure 125/78, pulse 70, temperature 98.3 F (36.8 C), temperature source Temporal, resp. rate 18, weight 173 lb (78.5 kg), SpO2 100%.  Wt Readings from Last 3 Encounters:  02/22/24 173 lb (78.5 kg)  02/01/24 165 lb 1.6 oz (74.9 kg)  01/03/24 174 lb 1.6 oz (79 kg)    Body mass index is 22.82 kg/m.    Onc Performance Status - 02/22/24 0857       ECOG Perf Status   ECOG Perf Status Restricted in physically strenuous activity but ambulatory and able to carry out work of a light or sedentary nature, e.g., light house work, office work      KPS SCALE   KPS % SCORE Normal activity with effort, some s/s of disease          PHYSICAL EXAM:   Physical Exam Constitutional:      General: He is not in acute distress.    Appearance: Normal appearance.  HENT:     Head: Normocephalic and atraumatic.  Eyes:     Conjunctiva/sclera: Conjunctivae normal.  Neck:     Comments: Scars from  recent carcinoma excision on the left side of neck/parotid area are well-healed.  Cardiovascular:     Rate and Rhythm: Normal rate and regular rhythm.  Pulmonary:     Effort: Pulmonary effort is normal. No respiratory distress.  Abdominal:     General: There is no distension.  Neurological:     General: No focal deficit present.     Mental Status: He is alert and oriented to person, place, and time.  Psychiatric:        Mood and Affect: Mood normal.        Behavior: Behavior normal.      LABORATORY DATA:   I have reviewed the data as listed.  Results for orders placed or performed in visit on 02/22/24  CBC with Differential (Cancer Center Only)  Result Value Ref  Range   WBC Count 5.8 4.0 - 10.5 K/uL   RBC 4.23 4.22 - 5.81 MIL/uL   Hemoglobin 12.8 (L) 13.0 - 17.0 g/dL   HCT 63.0 (L) 60.9 - 47.9 %   MCV 87.2 80.0 - 100.0 fL   MCH 30.3 26.0 - 34.0 pg   MCHC 34.7 30.0 - 36.0 g/dL   RDW 86.5 88.4 - 84.4 %   Platelet Count 191 150 - 400 K/uL   nRBC 0.0 0.0 - 0.2 %   Neutrophils Relative % 77 %   Neutro Abs 4.5 1.7 - 7.7 K/uL   Lymphocytes Relative 10 %   Lymphs Abs 0.6 (L) 0.7 - 4.0 K/uL   Monocytes Relative 10 %   Monocytes Absolute 0.6 0.1 - 1.0 K/uL  Eosinophils Relative 1 %   Eosinophils Absolute 0.1 0.0 - 0.5 K/uL   Basophils Relative 1 %   Basophils Absolute 0.0 0.0 - 0.1 K/uL   Immature Granulocytes 1 %   Abs Immature Granulocytes 0.03 0.00 - 0.07 K/uL      RADIOGRAPHIC STUDIES:  No recent pertinent imaging available to review.   CODE STATUS:  Code Status History     Date Active Date Inactive Code Status Order ID Comments User Context   09/13/2023 1247 09/14/2023 1436 Full Code [id number]  Jesus Oliphant, MD Inpatient    Questions for Most Recent Historical Code Status (Order [id number])     Question Answer   By: Other            No orders of the defined types were placed in this encounter.    Future Appointments  Date Time Provider Department Center  02/22/2024  9:15 AM [facility] INFUSION [facility] None  03/11/2024  1:30 PM [facility]-CT 2 [facility]-CT [place]  03/11/2024  2:00 PM WL-CT 1 [facility]-CT   03/15/2024  8:45 AM [facility] FLUSH [facility] None  03/15/2024  9:15 AM Arisha Gervais, MD [facility] None  03/15/2024  9:30 AM [facility] INFUSION [facility] None  03/19/2024 10:00 AM Izell Domino, MD [facility] None    This document was completed utilizing speech recognition software. Grammatical errors, random word insertions, pronoun errors, and incomplete sentences are an occasional consequence of this system due to software limitations, ambient noise, and hardware issues. Any formal questions or concerns about  the content, text or information contained within the body of this dictation should be directly addressed to the provider for clarification.   "

## 2024-02-22 NOTE — Patient Instructions (Signed)
 [facility] - A DEPT OF [facility]  Discharge Instructions: Thank you for choosing [facility] to provide your oncology and hematology care.   If you have a lab appointment with the Cancer Center, please go directly to the Cancer Center and check in at the registration area.   Wear comfortable clothing and clothing appropriate for easy access to any Portacath or PICC line.   We strive to give you quality time with your provider. You may need to reschedule your appointment if you arrive late (15 or more minutes).  Arriving late affects you and other patients whose appointments are after yours.  Also, if you miss three or more appointments without notifying the office, you may be dismissed from the clinic at the provider's discretion.      For prescription refill requests, have your pharmacy contact our office and allow 72 hours for refills to be completed.    Today you received the following chemotherapy and/or immunotherapy agents libtayo      To help prevent nausea and vomiting after your treatment, we encourage you to take your nausea medication as directed.  BELOW ARE SYMPTOMS THAT SHOULD BE REPORTED IMMEDIATELY: *FEVER GREATER THAN 100.4 F (38 C) OR HIGHER *CHILLS OR SWEATING *NAUSEA AND VOMITING THAT IS NOT CONTROLLED WITH YOUR NAUSEA MEDICATION *UNUSUAL SHORTNESS OF BREATH *UNUSUAL BRUISING OR BLEEDING *URINARY PROBLEMS (pain or burning when urinating, or frequent urination) *BOWEL PROBLEMS (unusual diarrhea, constipation, pain near the anus) TENDERNESS IN MOUTH AND THROAT WITH OR WITHOUT PRESENCE OF ULCERS (sore throat, sores in mouth, or a toothache) UNUSUAL RASH, SWELLING OR PAIN  UNUSUAL VAGINAL DISCHARGE OR ITCHING   Items with * indicate a potential emergency and should be followed up as soon as possible or go to the Emergency Department if any problems should occur.  Please show the CHEMOTHERAPY ALERT CARD or IMMUNOTHERAPY  ALERT CARD at check-in to the Emergency Department and triage nurse.  Should you have questions after your visit or need to cancel or reschedule your appointment, please contact [facility] - A DEPT OF [facility]  Dept: [phone]  and follow the prompts.  Office hours are 8:00 a.m. to 4:30 p.m. Monday - Friday. Please note that voicemails left after 4:00 p.m. may not be returned until the following business day.  We are closed weekends and major holidays. You have access to a nurse at all times for urgent questions. Please call the main number to the clinic Dept: [phone] and follow the prompts.   For any non-urgent questions, you may also contact your provider using MyChart. We now offer e-Visits for anyone 104 and older to request care online for non-urgent symptoms. For details visit [email or web address].   Also download the MyChart app! Go to the app store, search "MyChart", open the app, select [place], and log in with your MyChart username and password.

## 2024-02-22 NOTE — Assessment & Plan Note (Addendum)
 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   Post-operative status with positive deep margin, necessitating adjuvant radiation therapy.   No evidence of metastasis, but considered locally advanced due to depth of involvement.   Completed adjuvant radiation treatments on 12/05/2023.  Immunotherapy with cemiplimab  proposed post-radiation to prevent recurrence and manage any residual disease.   Discussed the mechanism of action of cemiplimab  as an immunotherapy that unmasks cancer cells to the immune system. Potential side effects include immune-mediated organ damage, with thyroid  dysfunction being the most common. Severe side effects are rare, occurring in less than 0.5% of cases. The decision to use cemiplimab  is based on its efficacy in preventing recurrence in similar cases and its role as a standard of care in such scenarios. Immunotherapy expected to last 1-2 years, depending on response and tolerance. Aims to enhance immune system's ability to recognize and attack cancer cells. Potential side effects include autoimmune reactions, with thyroid  dysfunction being most common. Regular monitoring for side effects, managed with steroids and treatment breaks as needed.  Patient agreeable to proceed with treatment.   He started treatments with Cemiplimab  from 01/03/2024.  Plan was to continue this every 3 weeks.  When he presented for dose #2 of cemiplimab  on 02/02/2024, he had respiratory/viral illness which caused significant fatigue, hypotension, tachycardia.  We held treatment that day and gave him IV fluids in clinic.  He had follow-up with his PCP later on and was treated with a course of antibiotics.  We did check cortisol, ACTH , TSH, prolactin, FSH, LH today to make sure there is no signs of hypophysitis, although it is less likely with just 1 dose of Cemiplimab .  Labs today reveal no dose-limiting toxicities.  He has recovered from recent viral  illness.  Will proceed with dose #2 of Cemiplimab  today, 02/22/2024.  He is scheduled to obtain restaging CT scans on 03/11/2024 as requested by radiation oncology department.  -In the future, we will consider restaging imaging (CT or PET scan) every three months to monitor for recurrence or metastasis.  RTC in 3 weeks for labs, office visit and continuation of Cemiplimab  treatments.

## 2024-02-23 LAB — PROLACTIN: Prolactin: 9.5 ng/mL (ref 3.6–25.2)

## 2024-02-23 LAB — ACTH: C206 ACTH: 14.4 pg/mL (ref 7.2–63.3)

## 2024-02-23 LAB — FOLLICLE STIMULATING HORMONE: FSH: 6.3 m[IU]/mL (ref 1.5–12.4)

## 2024-02-23 LAB — LUTEINIZING HORMONE: LH: 3.6 m[IU]/mL (ref 1.7–8.6)

## 2024-03-07 NOTE — Progress Notes (Incomplete)
 Charles Solis is here for a one month telephone follow up. He completed treatment for Secondary and unspecified malignant neoplasm of lymph nodes of head, face and neck on 12/05/2023.   Pain issues, if any: *** Using a feeding tube?: *** Weight changes, if any: *** Swallowing issues, if any: *** Smoking or chewing tobacco? *** Using fluoride  toothpaste daily? *** Last ENT visit was on: *** Other notable issues, if any: ***

## 2024-03-11 ENCOUNTER — Ambulatory Visit (HOSPITAL_COMMUNITY)

## 2024-03-11 ENCOUNTER — Encounter (HOSPITAL_COMMUNITY): Payer: Self-pay

## 2024-03-15 ENCOUNTER — Inpatient Hospital Stay

## 2024-03-15 ENCOUNTER — Encounter: Payer: Self-pay | Admitting: Oncology

## 2024-03-15 ENCOUNTER — Inpatient Hospital Stay: Admitting: Oncology

## 2024-03-15 ENCOUNTER — Other Ambulatory Visit: Payer: Self-pay

## 2024-03-15 VITALS — BP 115/85 | HR 72 | Temp 97.9°F | Resp 18 | Ht 73.0 in | Wt 171.5 lb

## 2024-03-15 DIAGNOSIS — C4490 Unspecified malignant neoplasm of skin, unspecified: Secondary | ICD-10-CM

## 2024-03-15 DIAGNOSIS — C44209 Unspecified malignant neoplasm of skin of left ear and external auricular canal: Secondary | ICD-10-CM

## 2024-03-15 DIAGNOSIS — Z5112 Encounter for antineoplastic immunotherapy: Secondary | ICD-10-CM | POA: Diagnosis not present

## 2024-03-15 LAB — CBC WITH DIFFERENTIAL (CANCER CENTER ONLY)
Abs Immature Granulocytes: 0.02 10*3/uL (ref 0.00–0.07)
Basophils Absolute: 0 10*3/uL (ref 0.0–0.1)
Basophils Relative: 1 %
Eosinophils Absolute: 0.1 10*3/uL (ref 0.0–0.5)
Eosinophils Relative: 1 %
HCT: 36.2 % — ABNORMAL LOW (ref 39.0–52.0)
Hemoglobin: 12.9 g/dL — ABNORMAL LOW (ref 13.0–17.0)
Immature Granulocytes: 0 %
Lymphocytes Relative: 12 %
Lymphs Abs: 0.6 10*3/uL — ABNORMAL LOW (ref 0.7–4.0)
MCH: 30.6 pg (ref 26.0–34.0)
MCHC: 35.6 g/dL (ref 30.0–36.0)
MCV: 86 fL (ref 80.0–100.0)
Monocytes Absolute: 0.5 10*3/uL (ref 0.1–1.0)
Monocytes Relative: 10 %
Neutro Abs: 3.7 10*3/uL (ref 1.7–7.7)
Neutrophils Relative %: 76 %
Platelet Count: 191 10*3/uL (ref 150–400)
RBC: 4.21 MIL/uL — ABNORMAL LOW (ref 4.22–5.81)
RDW: 13.5 % (ref 11.5–15.5)
WBC Count: 4.8 10*3/uL (ref 4.0–10.5)
nRBC: 0 % (ref 0.0–0.2)

## 2024-03-15 LAB — TSH: TSH: 1.21 u[IU]/mL (ref 0.350–4.500)

## 2024-03-15 LAB — CMP (CANCER CENTER ONLY)
ALT: 18 U/L (ref 0–44)
AST: 25 U/L (ref 15–41)
Albumin: 4.1 g/dL (ref 3.5–5.0)
Alkaline Phosphatase: 45 U/L (ref 38–126)
Anion gap: 11 (ref 5–15)
BUN: 25 mg/dL — ABNORMAL HIGH (ref 8–23)
CO2: 24 mmol/L (ref 22–32)
Calcium: 9.2 mg/dL (ref 8.9–10.3)
Chloride: 104 mmol/L (ref 98–111)
Creatinine: 0.81 mg/dL (ref 0.61–1.24)
GFR, Estimated: >60 mL/min
Glucose, Bld: 134 mg/dL — ABNORMAL HIGH (ref 70–99)
Potassium: 3.9 mmol/L (ref 3.5–5.1)
Sodium: 140 mmol/L (ref 135–145)
Total Bilirubin: 0.9 mg/dL (ref 0.0–1.2)
Total Protein: 6.1 g/dL — ABNORMAL LOW (ref 6.5–8.1)

## 2024-03-15 MED ORDER — SODIUM CHLORIDE 0.9 % IV SOLN: INTRAVENOUS | Status: DC

## 2024-03-15 MED ORDER — SODIUM CHLORIDE 0.9 % IV SOLN
350.0000 mg | Freq: Once | INTRAVENOUS | Status: AC
  Administered 2024-03-15: 350 mg via INTRAVENOUS
  Filled 2024-03-15: qty 7

## 2024-03-15 NOTE — Assessment & Plan Note (Addendum)
 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   Post-operative status with positive deep margin, necessitating adjuvant radiation therapy.   No evidence of metastasis, but considered locally advanced due to depth of involvement.   Completed adjuvant radiation treatments on 12/05/2023.  Immunotherapy with cemiplimab  proposed post-radiation to prevent recurrence and manage any residual disease.   Discussed the mechanism of action of cemiplimab  as an immunotherapy that unmasks cancer cells to the immune system. Potential side effects include immune-mediated organ damage, with thyroid  dysfunction being the most common. Severe side effects are rare, occurring in less than 0.5% of cases. The decision to use cemiplimab  is based on its efficacy in preventing recurrence in similar cases and its role as a standard of care in such scenarios. Immunotherapy expected to last 1-2 years, depending on response and tolerance. Aims to enhance immune system's ability to recognize and attack cancer cells. Potential side effects include autoimmune reactions, with thyroid  dysfunction being most common. Regular monitoring for side effects, managed with steroids and treatment breaks as needed.  Patient agreeable to proceed with treatment.   He started treatments with Cemiplimab  from 01/03/2024.  Plan was to continue this every 3 weeks.  When he presented for dose #2 of cemiplimab  on 02/02/2024, he had respiratory/viral illness which caused significant fatigue, hypotension, tachycardia.  We held treatment that day and gave him IV fluids in clinic.  He had follow-up with his PCP later on and was treated with a course of antibiotics.  We did check cortisol, ACTH , TSH, prolactin, FSH, LH previously to make sure there is no signs of hypophysitis, although it is less likely with just 1 dose of Cemiplimab .  All of this hormonal workup was unremarkable.  Labs today reveal no dose-limiting  toxicities.  Will proceed with dose # 3 of Cemiplimab  today, 03/15/2024.  He is scheduled to obtain restaging CT scans on 03/18/2024 as requested by radiation oncology department.  -In the future, we will consider restaging imaging (CT or PET scan) every three months to monitor for recurrence or metastasis.  RTC in 3 weeks for labs, office visit and continuation of Cemiplimab  treatments.

## 2024-03-15 NOTE — Progress Notes (Signed)
 "  [facility]  ONCOLOGY CLINIC PROGRESS NOTE   Patient Care Team: Patient, No Pcp Per as PCP - General (General Practice) Nahser, Aleene PARAS, MD (Inactive) as PCP - Cardiology (Cardiology) Izell Domino, MD as Attending Physician (Radiation Oncology) Autumn Millman, MD as Consulting Physician (Oncology) Jesus Oliphant, MD as Consulting Physician (Otolaryngology) Malmfelt, Delon CROME, RN as Oncology Nurse Navigator  PATIENT NAME: Charles Solis   MR#: [id number] DOB: 1950-04-28  Date of visit: 03/15/2024   ASSESSMENT & PLAN:   Charles Solis is a 74 y.o.  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 rpTx,rpN3b,rcM0, Stage IV disease.  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   Post-operative status with positive deep margin, necessitating adjuvant radiation therapy.   No evidence of metastasis, but considered locally advanced due to depth of involvement.   Completed adjuvant radiation treatments on 12/05/2023.  Immunotherapy with cemiplimab  proposed post-radiation to prevent recurrence and manage any residual disease.   Discussed the mechanism of action of cemiplimab  as an immunotherapy that unmasks cancer cells to the immune system. Potential side effects include immune-mediated organ damage, with thyroid  dysfunction being the most common. Severe side effects are rare, occurring in less than 0.5% of cases. The decision to use cemiplimab  is based on its efficacy in preventing recurrence in similar cases and its role as a standard of care in such scenarios. Immunotherapy expected to last 1-2 years, depending on response and tolerance. Aims to enhance immune system's ability to recognize and attack cancer cells. Potential side  effects include autoimmune reactions, with thyroid  dysfunction being most common. Regular monitoring for side effects, managed with steroids and treatment breaks as needed.  Patient agreeable to proceed with treatment.   He started treatments with Cemiplimab  from 01/03/2024.  Plan was to continue this every 3 weeks.  When he presented for dose #2 of cemiplimab  on 02/02/2024, he had respiratory/viral illness which caused significant fatigue, hypotension, tachycardia.  We held treatment that day and gave him IV fluids in clinic.  He had follow-up with his PCP later on and was treated with a course of antibiotics.  We did check cortisol, ACTH , TSH, prolactin, FSH, LH previously to make sure there is no signs of hypophysitis, although it is less likely with just 1 dose of Cemiplimab .  All of this hormonal workup was unremarkable.  Labs today reveal no dose-limiting toxicities.  Will proceed with dose # 3 of Cemiplimab  today, 03/15/2024.  He is scheduled to obtain restaging CT scans on 03/18/2024 as requested by radiation oncology department.  -In the future, we will consider restaging imaging (CT or PET scan) every three months to monitor for recurrence or metastasis.  RTC in 3 weeks for labs, office visit and continuation of Cemiplimab  treatments.  Transurethral Resection of the Prostate (TURP) Underwent TURP on December 22, 2023.  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 Symptoms remain mild and non-disabling. Non-pharmacologic interventions have provided limited benefit. Pilocarpine was considered, but the risk of adverse effects, particularly urinary incontinence and exacerbation of urinary urgency, outweighs potential benefit given current symptom severity and urinary symptoms. - Recommended frequent sips of water  during meals. - Discussed pilocarpine, including risks of  flushing, sweating, palpitations, blood pressure changes, diarrhea, and urinary incontinence; advised against use due to risk profile and current urinary urgency. - Advised trial of non-pharmacologic measures such as hard candy; xylomilk was ineffective and bothersome.  I reviewed lab results and outside records for this visit and discussed relevant results with the patient. Diagnosis, plan of care and treatment options were also discussed in detail with the patient. Opportunity provided to ask questions and answers provided to his apparent satisfaction. Provided instructions to call our clinic with any problems, questions or concerns prior to return visit. I recommended to continue follow-up with PCP and sub-specialists. He verbalized understanding and agreed with the plan.   NCCN guidelines have been consulted in the planning of this patients care.  I spent a total of 42 minutes during this encounter with the patient including review of chart and various tests results, discussions about plan of care and coordination of care plan.   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 rpTx,rpN3b,rcM0, Stage IV disease.  Current Treatment: Received adjuvant radiation for positive deep margin.  Completed radiation treatments on 12/05/2023.  Given locally advanced nature, proposed adjuvant treatments with cemiplimab  and patient is agreeable.  Started this from 01/03/2024.  INTERVAL HISTORY:    Discussed the use of AI scribe software for clinical note transcription with the patient, who gave verbal consent to proceed.  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  He is receiving cemiplimab  immunotherapy every three weeks for high-risk squamous cell carcinoma of the left ear, following parotidectomy, radical neck dissection, and adjuvant radiation completed in October 2025. He has tolerated recent immunotherapy well, with no adverse reactions in the past three weeks. Recent blood work included hemoglobin, renal function, electrolytes, hepatic function, and thyroid  function. He is scheduled for a CT scan next week after a weather-related delay.  He continues to experience persistent xerostomia attributed to prior radiation therapy, which he manages by maintaining hydration and frequent water  intake. Despite these measures, dry mouth remains a chronic issue.  He reports ongoing constipation, with bowel movements occurring every other day or every second day, sometimes associated with pain. He has increased dietary fiber intake and uses Miralax and Metamucil, but symptoms persist. He has not noted improvement in stool consistency despite these interventions.  He recently recovered from a viral illness, possibly bronchitis, which resolved with conservative management. He denies current fever, cough, or other infectious symptoms. His appetite is fair; he consumes multiple small meals daily and believes his weight has stabilized.  Feb 22, 2024: Follow-up for Stage IV squamous cell carcinoma of the skin of the face after parotidectomy and radical neck dissection (09/13/2023), adjuvant radiation (completed 12/05/2023), and initiation of cemiplimab  immunotherapy (started 01/03/2024). Presented for second dose of cemiplimab , but treatment was held due to recent viral illness with fatigue, hypotension, and tachycardia; supportive care provided and plan to resume immunotherapy in three weeks. Persistent mild radiation-induced  xerostomia managed with non-pharmacologic measures; recovering from viral illness with improving energy and stable weight; labs reviewed  and stable.     I have reviewed the past medical history, past surgical history, social history and family history with the patient and they are unchanged from previous note.  HISTORY OF PRESENT ILLNESS:   ONCOLOGY HISTORY:   He has a history significant for prostate cancer and skin cancer with multiple recurrences. The most recent skin cancer removal was Mohs surgery to the left pinna about 3 years ago.    A few months ago, patient developed a lesion located on his left jaw area. Lesion has since continued increasing in size. To further investigate his symptoms, he underwent a CT neck on 07/18/23 showing an ovoid centrally hypodense lesion present posteriorly within or abutting the superficial lobe of the left parotid, measuring approximately 22 x 17 x 18 mm.    In light of findings, he then underwent a fine-needle aspiration biopsy on 08/31/23. Pathology indicated the presence of malignant cells, carcinoma with squamous differentiation, with no lymphoid tissue identified.   Patient then presented for a second-opinion with Dr. Jesus on 09/04/23. Based on his recommendations, patient underwent a parotidectomy with radical neck dissection on 09/13/23. Surgical pathology redemonstrated tumor size of 3.5 x 3.2 x 2.3 cm  and histology of  invasive moderately differentiated squamous cell carcinoma with tumor present in dermis and infiltrates the subcutaneous adipose to superficially invade the underlying parotid gland. Tumor present in 3:00 margin but is negative for angiolymphatic and perineural invasion. Neck dissection pathology of 20 lymph nodes are negative for carcinoma.             Staging PET scan on 09/28/2023 showed left sided neck hypermetabolic some without well-defined correlate mass or adenopathy and is likely postoperative.  No evidence of extracervical  suspicious activity.  Incidental findings include a 12 mm bladder stone and evidence of CAD and aortic atherosclerosis.  4 mm right lower lobe lung nodule, below PET resolution.  rpTx,rpN3b,rcM0, Stage IV disease.   Positive deep margin, necessitating adjuvant radiation therapy.  Completed adjuvant radiation treatments on 12/05/2023.   No evidence of metastasis, but considered locally advanced due to depth of involvement.    Immunotherapy with cemiplimab  proposed post-radiation to prevent recurrence and manage any residual disease.  Patient agreeable with this approach.  He underwent TURP for prostate issue on 12/22/2023.    Started cemiplimab  treatments from 01/03/2024.  Oncology History  Skin cancer  10/04/2023 Initial Diagnosis   Skin cancer   10/04/2023 Cancer Staging   Staging form: Cutaneous Carcinoma of the Head and Neck, AJCC 8th Edition - Pathologic stage from 10/04/2023: Stage IV (rpTX, rpN3b, rcM0) - Signed by Izell Domino, MD on 10/04/2023 Stage prefix: Recurrence Extraosseous extension: Unknown   01/03/2024 -  Chemotherapy   Patient is on Treatment Plan : ADVANCED CUTANEOUS SQUAMOUS CELL CARCINOMA Cemiplimab  q21d         REVIEW OF SYSTEMS:   Review of Systems - Oncology  All other pertinent systems were reviewed with the patient and are negative.  ALLERGIES: He has no known allergies.  MEDICATIONS:  Current Outpatient Medications  Medication Sig Dispense Refill   aspirin EC 81 MG tablet Take 81 mg by mouth daily. Swallow whole.     cyanocobalamin  (VITAMIN B12) 1000 MCG tablet Take 1,000 mcg by mouth daily.     ezetimibe  (ZETIA ) 10 MG tablet TAKE ONE TABLET BY MOUTH EVERY DAY 90 tablet 3   fexofenadine (ALLEGRA) 180 MG tablet Take 180 mg by mouth daily.     finasteride  (PROSCAR ) 5 MG tablet  Take 5 mg by mouth daily.     ipratropium (ATROVENT ) 0.03 % nasal spray Place 1 spray into both nostrils daily.     lidocaine -prilocaine  (EMLA ) cream Apply to affected area  once 30 g 3   metoprolol  tartrate (LOPRESSOR ) 25 MG tablet Take 1 tablet (25 mg total) by mouth 2 (two) times daily. 180 tablet 1   ondansetron  (ZOFRAN ) 8 MG tablet Take 1 tablet (8 mg total) by mouth every 8 (eight) hours as needed for nausea or vomiting. 30 tablet 1   prochlorperazine  (COMPAZINE ) 10 MG tablet Take 1 tablet (10 mg total) by mouth every 6 (six) hours as needed for nausea or vomiting. 30 tablet 1   pseudoephedrine (SUDAFED) 30 MG tablet Take 30 mg by mouth every 4 (four) hours as needed for congestion.     rosuvastatin  (CRESTOR ) 20 MG tablet Take 1 tablet (20 mg total) by mouth daily. 90 tablet 3   No current facility-administered medications for this visit.   Facility-Administered Medications Ordered in Other Visits  Medication Dose Route Frequency Provider Last Rate Last Admin   0.9 %  sodium chloride  infusion   Intravenous Continuous Maryan Sivak, MD   Stopped at 03/15/24 1035     VITALS:   Blood pressure 115/85, pulse 72, temperature 97.9 F (36.6 C), resp. rate 18, height 6' 1 (1.854 m), weight 171 lb 8 oz (77.8 kg), SpO2 100%.  Wt Readings from Last 3 Encounters:  03/15/24 171 lb 8 oz (77.8 kg)  02/22/24 173 lb (78.5 kg)  02/01/24 165 lb 1.6 oz (74.9 kg)    Body mass index is 22.63 kg/m.    Onc Performance Status - 03/15/24 0908       ECOG Perf Status   ECOG Perf Status Restricted in physically strenuous activity but ambulatory and able to carry out work of a light or sedentary nature, e.g., light house work, office work      KPS SCALE   KPS % SCORE Normal activity with effort, some s/s of disease          PHYSICAL EXAM:   Physical Exam Constitutional:      General: He is not in acute distress.    Appearance: Normal appearance.  HENT:     Head: Normocephalic and atraumatic.  Eyes:     Conjunctiva/sclera: Conjunctivae normal.  Neck:     Comments: Scars from  recent carcinoma excision on the left side of neck/parotid area are well-healed.   Cardiovascular:     Rate and Rhythm: Normal rate and regular rhythm.  Pulmonary:     Effort: Pulmonary effort is normal. No respiratory distress.  Abdominal:     General: There is no distension.  Neurological:     General: No focal deficit present.     Mental Status: He is alert and oriented to person, place, and time.  Psychiatric:        Mood and Affect: Mood normal.        Behavior: Behavior normal.      LABORATORY DATA:   I have reviewed the data as listed.  Results for orders placed or performed in visit on 03/15/24  CBC with Differential (Cancer Center Only)  Result Value Ref Range   WBC Count 4.8 4.0 - 10.5 K/uL   RBC 4.21 (L) 4.22 - 5.81 MIL/uL   Hemoglobin 12.9 (L) 13.0 - 17.0 g/dL   HCT 63.7 (L) 60.9 - 47.9 %   MCV 86.0 80.0 - 100.0 fL   MCH 30.6  26.0 - 34.0 pg   MCHC 35.6 30.0 - 36.0 g/dL   RDW 86.4 88.4 - 84.4 %   Platelet Count 191 150 - 400 K/uL   nRBC 0.0 0.0 - 0.2 %   Neutrophils Relative % 76 %   Neutro Abs 3.7 1.7 - 7.7 K/uL   Lymphocytes Relative 12 %   Lymphs Abs 0.6 (L) 0.7 - 4.0 K/uL   Monocytes Relative 10 %   Monocytes Absolute 0.5 0.1 - 1.0 K/uL   Eosinophils Relative 1 %   Eosinophils Absolute 0.1 0.0 - 0.5 K/uL   Basophils Relative 1 %   Basophils Absolute 0.0 0.0 - 0.1 K/uL   Immature Granulocytes 0 %   Abs Immature Granulocytes 0.02 0.00 - 0.07 K/uL  CMP (Cancer Center only)  Result Value Ref Range   Sodium 140 135 - 145 mmol/L   Potassium 3.9 3.5 - 5.1 mmol/L   Chloride 104 98 - 111 mmol/L   CO2 24 22 - 32 mmol/L   Glucose, Bld 134 (H) 70 - 99 mg/dL   BUN 25 (H) 8 - 23 mg/dL   Creatinine 9.18 9.38 - 1.24 mg/dL   Calcium  9.2 8.9 - 10.3 mg/dL   Total Protein 6.1 (L) 6.5 - 8.1 g/dL   Albumin  4.1 3.5 - 5.0 g/dL   AST 25 15 - 41 U/L   ALT 18 0 - 44 U/L   Alkaline Phosphatase 45 38 - 126 U/L   Total Bilirubin 0.9 0.0 - 1.2 mg/dL   GFR, Estimated >39 >39 mL/min   Anion gap 11 5 - 15  TSH  Result Value Ref Range   TSH 1.210 0.350  - 4.500 uIU/mL      RADIOGRAPHIC STUDIES:  No recent pertinent imaging available to review.   CODE STATUS:  Code Status History     Date Active Date Inactive Code Status Order ID Comments User Context   09/13/2023 1247 09/14/2023 1436 Full Code [id number]  Jesus Oliphant, MD Inpatient    Questions for Most Recent Historical Code Status (Order [id number])     Question Answer   By: Other            No orders of the defined types were placed in this encounter.    Future Appointments  Date Time Provider Department Center  03/18/2024  2:30 PM [facility] 1 [facility] [place]  03/19/2024 10:00 AM Izell Domino, MD [facility] None  04/03/2024  8:30 AM [facility] FLUSH [facility] None  04/03/2024  8:45 AM Alayziah Tangeman, Chinita, MD [facility] None  04/03/2024  9:30 AM [facility] INFUSION [facility] None    This document was completed utilizing speech recognition software. Grammatical errors, random word insertions, pronoun errors, and incomplete sentences are an occasional consequence of this system due to software limitations, ambient noise, and hardware issues. Any formal questions or concerns about the content, text or information contained within the body of this dictation should be directly addressed to the provider for clarification.   "

## 2024-03-16 LAB — T4: T4, Total: 6.1 ug/dL (ref 4.5–12.0)

## 2024-03-17 ENCOUNTER — Other Ambulatory Visit: Payer: Self-pay

## 2024-03-18 ENCOUNTER — Ambulatory Visit (HOSPITAL_COMMUNITY)
Admission: RE | Admit: 2024-03-18 | Discharge: 2024-03-18 | Disposition: A | Discharge status: Home / Self Care | Source: Ambulatory Visit | Attending: Radiology | Admitting: Radiology

## 2024-03-18 DIAGNOSIS — C770 Secondary and unspecified malignant neoplasm of lymph nodes of head, face and neck: Secondary | ICD-10-CM

## 2024-03-18 MED ORDER — IOHEXOL 300 MG/ML  SOLN
75.0000 mL | Freq: Once | INTRAMUSCULAR | Status: AC | PRN
  Administered 2024-03-18: 75 mL via INTRAVENOUS

## 2024-03-18 MED ORDER — HEPARIN SOD (PORK) LOCK FLUSH 100 UNIT/ML IV SOLN
INTRAVENOUS | Status: AC
  Filled 2024-03-18: qty 5

## 2024-03-18 MED ORDER — HEPARIN SOD (PORK) LOCK FLUSH 100 UNIT/ML IV SOLN
500.0000 [IU] | Freq: Once | INTRAVENOUS | Status: AC
  Administered 2024-03-18: 500 [IU] via INTRAVENOUS

## 2024-03-18 NOTE — Progress Notes (Incomplete)
 " Radiation Oncology         [phone] ________________________________  Name: SAVALAS MONJE MRN: [id number]  Date: 03/19/2024  DOB: [birth year]  Follow-Up Visit Note  CC: Patient, No Pcp Per  No ref. provider found  Diagnosis and Prior Radiotherapy:     No diagnosis found.    ==========DELIVERED PLANS==========  First Treatment Date: 2023-10-25 Last Treatment Date: 2023-12-05   Plan Name: [id number] Site: Parotid, Left Technique: IMRT Mode: Photon Dose Per Fraction: 2 Gy Prescribed Dose (Delivered / Prescribed): 60 Gy / 60 Gy Prescribed Fxs (Delivered / Prescribed): 30 / 30   Cancer Staging  Skin cancer Staging form: Cutaneous Carcinoma of the Head and Neck, AJCC 8th Edition - Pathologic stage from 10/04/2023: Stage IV (rpTX, rpN3b, rcM0) - Signed by Izell Domino, MD on 10/04/2023 Stage prefix: Recurrence Extraosseous extension: Unknown  Stage IV (rpTx, rpN3b, 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   Narrative:  ***                ALLERGIES:  has no known allergies.  Meds: Current Outpatient Medications  Medication Sig Dispense Refill   aspirin EC 81 MG tablet Take 81 mg by mouth daily. Swallow whole.     cyanocobalamin  (VITAMIN B12) 1000 MCG tablet Take 1,000 mcg by mouth daily.     ezetimibe  (ZETIA ) 10 MG tablet TAKE ONE TABLET BY MOUTH EVERY DAY 90 tablet 3   fexofenadine (ALLEGRA) 180 MG tablet Take 180 mg by mouth daily.     finasteride  (PROSCAR ) 5 MG tablet Take 5 mg by mouth daily.     ipratropium (ATROVENT ) 0.03 % nasal spray Place 1 spray into both nostrils daily.     lidocaine -prilocaine  (EMLA ) cream Apply to affected area once 30 g 3   metoprolol  tartrate (LOPRESSOR ) 25 MG tablet Take 1 tablet (25 mg total) by mouth 2  (two) times daily. 180 tablet 1   ondansetron  (ZOFRAN ) 8 MG tablet Take 1 tablet (8 mg total) by mouth every 8 (eight) hours as needed for nausea or vomiting. 30 tablet 1   prochlorperazine  (COMPAZINE ) 10 MG tablet Take 1 tablet (10 mg total) by mouth every 6 (six) hours as needed for nausea or vomiting. 30 tablet 1   pseudoephedrine (SUDAFED) 30 MG tablet Take 30 mg by mouth every 4 (four) hours as needed for congestion.     rosuvastatin  (CRESTOR ) 20 MG tablet Take 1 tablet (20 mg total) by mouth daily. 90 tablet 3   No current facility-administered medications for this visit.    Physical Findings: The patient is in no acute distress. Patient is alert and oriented. Wt Readings from Last 3 Encounters:  03/15/24 171 lb 8 oz (77.8 kg)  02/22/24 173 lb (78.5 kg)  02/01/24 165 lb 1.6 oz (74.9 kg)    vitals were not taken for this visit. .  General: Alert and oriented, in no acute distress HEENT: Head is normocephalic. Extraocular movements are intact. Oropharynx is notable for no concerning lesions within the oral cavity. Good dentition.  Neck: Neck is notable for no palpable adenopathy. Well-healed surgical incision along the left cervical region. Left upper trapezius muscle tightness.  Skin: Skin in treatment fields shows open sub-centimeter skin lesion at the back of the left earlobe.  Heart: Regular in rate and rhythm  with no murmurs, rubs, or gallops. Chest: Clear to auscultation bilaterally, with no rhonchi, wheezes, or rales. Abdomen: Soft, nontender, nondistended, with no rigidity or guarding. Extremities: No cyanosis or edema. Lymphatics: see Neck Exam Psychiatric: Judgment and insight are intact. Affect is appropriate.   Lab Findings: Lab Results  Component Value Date   WBC 4.8 03/15/2024   HGB 12.9 (L) 03/15/2024   HCT 36.2 (L) 03/15/2024   MCV 86.0 03/15/2024   PLT 191 03/15/2024    Lab Results  Component Value Date   TSH 1.210 03/15/2024    Radiographic  Findings: No results found.  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  Wt Readings from Last 3 Encounters:  03/15/24 171 lb 8 oz (77.8 kg)  02/22/24 173 lb (78.5 kg)  02/01/24 165 lb 1.6 oz (74.9 kg)  PEG tube: N/A  Swallowing: Functional. He will see Lupita SLP on 01/31/2024.   Encouraged to continue regular followup with dentistry, and dental hygiene including fluoride  rinses. Patient states he will see his dentist regularly every 6 months.   Thyroid  function: *** Lab Results  Component Value Date   TSH 1.210 03/15/2024    Other: Left trapezius muscle tightness. ***   Followup in ***. The patient was encouraged to call with any issues or questions before then.  On date of service, in total, I spent *** minutes on this encounter. Patient was seen in person. -----------------------------------  Lauraine Golden, MD    "

## 2024-03-19 ENCOUNTER — Ambulatory Visit: Admitting: Radiation Oncology

## 2024-03-19 ENCOUNTER — Telehealth: Payer: Self-pay | Admitting: Radiation Oncology

## 2024-03-19 ENCOUNTER — Ambulatory Visit: Admitting: Radiology

## 2024-03-19 NOTE — Telephone Encounter (Signed)
 Called pt to r/s missed appt. Pt agreeable to 2/4 @ 11am for f/u appt.

## 2024-03-20 ENCOUNTER — Ambulatory Visit
Admission: RE | Admit: 2024-03-20 | Discharge: 2024-03-20 | Attending: Radiation Oncology | Admitting: Radiation Oncology

## 2024-03-20 VITALS — BP 121/83 | HR 80 | Temp 97.8°F | Resp 18 | Wt 170.4 lb

## 2024-03-20 DIAGNOSIS — C770 Secondary and unspecified malignant neoplasm of lymph nodes of head, face and neck: Secondary | ICD-10-CM

## 2024-03-20 NOTE — Progress Notes (Signed)
 " Radiation Oncology         [phone] ________________________________  Name: Charles Solis MRN: [id number]  Date: 03/20/2024  DOB: October 16, 1950  Follow-Up Visit Note  CC: Delayne Artist PARAS, MD  Delayne Artist PARAS, MD  Diagnosis and Prior Radiotherapy:       ICD-10-CM   1. 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.0        ==========DELIVERED PLANS==========  First Treatment Date: 2023-10-25 Last Treatment Date: 2023-12-05   Plan Name: [id number] Site: Parotid, Left Technique: IMRT Mode: Photon Dose Per Fraction: 2 Gy Prescribed Dose (Delivered / Prescribed): 60 Gy / 60 Gy Prescribed Fxs (Delivered / Prescribed): 30 / 30   Cancer Staging  Skin cancer Staging form: Cutaneous Carcinoma of the Head and Neck, AJCC 8th Edition - Pathologic stage from 10/04/2023: Stage IV (rpTX, rpN3b, rcM0) - Signed by Izell Domino, MD on 10/04/2023 Stage prefix: Recurrence Extraosseous extension: Unknown  Stage IV (rpTx, rpN3b, 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  Narrative:   Pain issues, if any: soreness in left shoulder Skin changes? None Weight changes, if any:  Wt Readings from Last 3 Encounters:  03/20/24 170 lb 6 oz (77.3 kg)  03/15/24 171 lb 8 oz (77.8 kg)  02/22/24 173 lb (78.5 kg)    Swallowing issues, if any: Yes, Dry mouth lack of saliva production. Smoking or chewing tobacco? None. Using fluoride  toothpaste daily? Yes Last ENT visit was on: None Other notable issues, if any: He still has dysarthria.  He is swallowing well.  He is doing physical therapy exercises at home for his shoulder.  He was lost to follow-up with speech-language pathology and physical therapy due to a viral illness in the winter. Fatigue and  dizziness.  Dizziness is provoked by leaning over or turning his head in certain directions or walking        He is still receiving infusional immunotherapy with Dr. Autumn         ALLERGIES:  has no known allergies.  Meds: Current Outpatient Medications  Medication Sig Dispense Refill   aspirin EC 81 MG tablet Take 81 mg by mouth daily. Swallow whole.     cyanocobalamin  (VITAMIN B12) 1000 MCG tablet Take 1,000 mcg by mouth daily.     ezetimibe  (ZETIA ) 10 MG tablet TAKE ONE TABLET BY MOUTH EVERY DAY 90 tablet 3   fexofenadine (ALLEGRA) 180 MG tablet Take 180 mg by mouth daily.     finasteride  (PROSCAR ) 5 MG tablet Take 5 mg by mouth daily.     ipratropium (ATROVENT ) 0.03 % nasal spray Place 1 spray into both nostrils daily.     lidocaine -prilocaine  (EMLA ) cream Apply to affected area once 30 g 3   metoprolol  tartrate (LOPRESSOR ) 25 MG tablet Take 1 tablet (25 mg total) by mouth 2 (two) times daily. 180 tablet 1   ondansetron  (ZOFRAN ) 8 MG tablet Take 1 tablet (8 mg total) by mouth every 8 (eight) hours as needed for nausea or vomiting. 30 tablet 1   prochlorperazine  (COMPAZINE ) 10 MG tablet Take 1 tablet (10 mg total) by mouth every 6 (six) hours as needed for nausea or vomiting. 30 tablet 1   pseudoephedrine (SUDAFED) 30 MG tablet Take 30 mg by mouth every 4 (four) hours as needed for  congestion.     rosuvastatin  (CRESTOR ) 20 MG tablet Take 1 tablet (20 mg total) by mouth daily. 90 tablet 3   No current facility-administered medications for this encounter.    Physical Findings: The patient is in no acute distress. Patient is alert and oriented. Wt Readings from Last 3 Encounters:  03/20/24 170 lb 6 oz (77.3 kg)  03/15/24 171 lb 8 oz (77.8 kg)  02/22/24 173 lb (78.5 kg)    weight is 170 lb 6 oz (77.3 kg). His temporal temperature is 97.8 F (36.6 C). His blood pressure is 121/83 and his pulse is 80. His respiration is 18 and oxygen  saturation is 100%. .  General: Alert and oriented,  in no acute distress HEENT: Head is normocephalic. Extraocular movements are intact. Oropharynx is notable for no concerning lesions within the oral cavity.  Tongue deviates slightly to the left.  Good dentition.  Dry mouth Neck: Neck is notable for no palpable adenopathy. Well-healed surgical incision along the left cervical region.   Skin: Skin in treatment fields has healed well Heart: Regular in rate and rhythm with no murmurs, rubs, or gallops. Chest: Clear to auscultation bilaterally, with no rhonchi, wheezes, or rales. Abdomen: Soft, nontender, nondistended, with no rigidity or guarding. Extremities: No cyanosis or edema. Lymphatics: see Neck Exam Psychiatric: Judgment and insight are intact. Affect is appropriate.   Lab Findings: Lab Results  Component Value Date   WBC 4.8 03/15/2024   HGB 12.9 (L) 03/15/2024   HCT 36.2 (L) 03/15/2024   MCV 86.0 03/15/2024   PLT 191 03/15/2024    Lab Results  Component Value Date   TSH 1.210 03/15/2024    Radiographic Findings: CT Chest W Contrast Result Date: 03/18/2024 EXAM: CT CHEST WITH CONTRAST 03/18/2024 02:53:06 PM TECHNIQUE: CT of the chest was performed with the administration of 75 mL iohexol  (OMNIPAQUE ) 300 MG/ML solution. Multiplanar reformatted images are provided for review. Automated exposure control, iterative reconstruction, and/or weight based adjustment of the mA/kV was utilized to reduce the radiation dose to as low as reasonably achievable. COMPARISON: CT 09/29/2014. CLINICAL HISTORY: Head/neck cancer, monitor. Head and neck cancer; monitor. * Tracking Code: BO *. FINDINGS: MEDIASTINUM: Heart and pericardium are unremarkable. The central airways are clear. LYMPH NODES: No mediastinal, hilar or axillary lymphadenopathy. No metastatic lymphadenopathy. LUNGS AND PLEURA: Small nodule in the right lower lobe along the fissure on image 44 is not changed from CT 09/29/2014. No new or suspicious pulmonary nodules. Mild linear  atelectasis at the right lung base. No pleural effusion or pneumothorax. SOFT TISSUES/BONES: Chemotherapy port in the right chest wall. No acute abnormality of the bones. UPPER ABDOMEN: Small hypodensity in the liver consistent with benign hepatic cyst. IMPRESSION: 1. No evidence of metastatic disease. 2. No acute findings. Electronically signed by: Norleen Boxer MD 03/18/2024 03:43 PM EST RP Workstation: [id number]   CT Soft Tissue Neck W Contrast Result Date: 03/18/2024 EXAM: CT NECK WITH CONTRAST 03/18/2024 02:53:06 PM TECHNIQUE: CT of the neck was performed with the administration of 75 mL of iohexol  (OMNIPAQUE ) 300 MG/ML solution. Multiplanar reformatted images are provided for review. Automated exposure control, iterative reconstruction, and/or weight based adjustment of the mA/kV was utilized to reduce the radiation dose to as low as reasonably achievable. COMPARISON: CT neck 07/18/2023. PET CT 09/28/2023. CLINICAL HISTORY: Head/neck cancer, monitor. History of squamous cell skin cancer with metastasis to the left parotid gland, status post parotidectomy, neck dissection, and adjuvant radiation. FINDINGS: AERODIGESTIVE TRACT: No discrete mass. Diffuse hypodensity throughout  the left hemi-tongue which is new from the prior neck CT and progressive from the prior PET-CT compatible with denervation. SALIVARY GLANDS: Sequelae of partial left parotidectomy with amorphous soft tissue thickening in this region, likely treatment related. No discrete recurrent enhancing mass. Unremarkable appearance of the right parotid and left submandibular glands. Punctate calcification in the right submandibular gland. THYROID : Unremarkable. LYMPH NODES: No suspicious cervical lymphadenopathy. SOFT TISSUES: No mass or fluid collection. BONES: No suspicious bone lesion. Moderately advanced disc degeneration at C4-C5 and C5-C6. OTHER: Visualized sinuses and mastoid air cells are well aerated. Chest reported separately. IMPRESSION:  1. Posttreatment changes in the neck without evidence of recurrent disease. 2. Progressive findings of denervation involving the left hemi-tongue. 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      Head and Neck Cancer Status: In remission according to physical exam and CT scans  Nutritional Status: Stable.  Wt Readings from Last 3 Encounters:  03/20/24 170 lb 6 oz (77.3 kg)  03/15/24 171 lb 8 oz (77.8 kg)  02/22/24 173 lb (78.5 kg)  PEG tube: N/A  Speech/ swallowing: swallowing Functional.   Still has dysarthria.  Lost to f/u with SLP.  We will rerefer  Encouraged to continue regular followup with dentistry, and dental hygiene including fluoride  rinses. Patient states he will see his dentist regularly -I recommended he talk to his dentist about getting fluoride  supplements given his chronic dry mouth.  We also discussed over-the-counter antidotes for dry mouth.   Thyroid  function: Checking regularly in medical oncology Lab Results  Component Value Date   TSH 1.210 03/15/2024    Other: Left trapezius muscle tightness. Does PT exercises at home.  Will rerefer to PT as he was lost f/u to them.    Followup in 10 months with repeat imaging of neck and chest with CT with contrast. The patient was encouraged to call with any issues or questions before then.  On date of service, in total, I spent 40 minutes on this encounter. Patient was seen in person. -----------------------------------  Lauraine Golden, MD    "

## 2024-03-20 NOTE — Progress Notes (Signed)
 Pain issues, if any: soreness in left shoulder Skin changes? None Weight changes, if any:  Wt Readings from Last 3 Encounters:  03/20/24 170 lb 6 oz (77.3 kg)  03/15/24 171 lb 8 oz (77.8 kg)  02/22/24 173 lb (78.5 kg)    Swallowing issues, if any: Yes, Dry mouth lack of saliva production. Smoking or chewing tobacco? None. Using fluoride  toothpaste daily? Yes Last ENT visit was on: None Other notable issues, if any: Fatigue and dizziness.    BP 121/83 (BP Location: Right Arm, Patient Position: Sitting, Cuff Size: Normal)   Pulse 80   Temp 97.8 F (36.6 C) (Temporal)   Resp 18   Wt 170 lb 6 oz (77.3 kg)   SpO2 100%   BMI 22.48 kg/m

## 2024-03-21 ENCOUNTER — Other Ambulatory Visit: Payer: Self-pay

## 2024-03-21 ENCOUNTER — Telehealth: Payer: Self-pay | Admitting: Radiation Oncology

## 2024-03-21 DIAGNOSIS — C770 Secondary and unspecified malignant neoplasm of lymph nodes of head, face and neck: Secondary | ICD-10-CM

## 2024-03-21 NOTE — Telephone Encounter (Signed)
 2/5 Forward outgoing referral to Lupita RAMAN., Speech Therapy - [facility].

## 2024-04-02 ENCOUNTER — Ambulatory Visit: Admitting: Physical Therapy

## 2024-04-03 ENCOUNTER — Inpatient Hospital Stay: Admitting: Oncology

## 2024-04-03 ENCOUNTER — Inpatient Hospital Stay: Attending: Oncology

## 2024-04-03 ENCOUNTER — Inpatient Hospital Stay

## 2025-01-14 ENCOUNTER — Ambulatory Visit: Admitting: Radiation Oncology
